# Patient Record
Sex: Female | Born: 1975
Health system: Southern US, Community
[De-identification: ages and names within clinical notes are randomized; demographics above are authoritative.]

## PROBLEM LIST (undated history)

## (undated) ENCOUNTER — Inpatient Hospital Stay (HOSPITAL_COMMUNITY): Payer: Self-pay

## (undated) DIAGNOSIS — B009 Herpesviral infection, unspecified: Secondary | ICD-10-CM

## (undated) DIAGNOSIS — D649 Anemia, unspecified: Secondary | ICD-10-CM

## (undated) DIAGNOSIS — Z8619 Personal history of other infectious and parasitic diseases: Secondary | ICD-10-CM

## (undated) DIAGNOSIS — O24419 Gestational diabetes mellitus in pregnancy, unspecified control: Secondary | ICD-10-CM

## (undated) DIAGNOSIS — E669 Obesity, unspecified: Secondary | ICD-10-CM

## (undated) DIAGNOSIS — T7840XA Allergy, unspecified, initial encounter: Secondary | ICD-10-CM

## (undated) DIAGNOSIS — E119 Type 2 diabetes mellitus without complications: Secondary | ICD-10-CM

## (undated) DIAGNOSIS — E785 Hyperlipidemia, unspecified: Secondary | ICD-10-CM

## (undated) DIAGNOSIS — O09529 Supervision of elderly multigravida, unspecified trimester: Secondary | ICD-10-CM

## (undated) HISTORY — DX: Anemia, unspecified: D64.9

## (undated) HISTORY — DX: Personal history of other infectious and parasitic diseases: Z86.19

## (undated) HISTORY — DX: Gestational diabetes mellitus in pregnancy, unspecified control: O24.419

## (undated) HISTORY — PX: HERPES SIMPLEX VIRUS DFA: LAB15028

## (undated) HISTORY — DX: Herpesviral infection, unspecified: B00.9

## (undated) HISTORY — DX: Allergy, unspecified, initial encounter: T78.40XA

## (undated) HISTORY — DX: Hyperlipidemia, unspecified: E78.5

## (undated) HISTORY — DX: Supervision of elderly multigravida, unspecified trimester: O09.529

## (undated) HISTORY — DX: Obesity, unspecified: E66.9

---

## 2006-08-14 DIAGNOSIS — Z8619 Personal history of other infectious and parasitic diseases: Secondary | ICD-10-CM

## 2006-08-14 HISTORY — DX: Personal history of other infectious and parasitic diseases: Z86.19

## 2010-10-18 ENCOUNTER — Inpatient Hospital Stay (HOSPITAL_COMMUNITY): Payer: Self-pay

## 2010-10-18 ENCOUNTER — Encounter (HOSPITAL_COMMUNITY): Payer: Self-pay

## 2010-10-18 ENCOUNTER — Inpatient Hospital Stay (HOSPITAL_COMMUNITY)
Admission: AD | Admit: 2010-10-18 | Discharge: 2010-10-18 | Disposition: A | Discharge status: Home / Self Care | Payer: Self-pay | Source: Ambulatory Visit | Attending: Obstetrics & Gynecology | Admitting: Obstetrics & Gynecology

## 2010-10-18 DIAGNOSIS — R109 Unspecified abdominal pain: Secondary | ICD-10-CM

## 2010-10-18 DIAGNOSIS — O9989 Other specified diseases and conditions complicating pregnancy, childbirth and the puerperium: Secondary | ICD-10-CM

## 2010-10-18 DIAGNOSIS — O99891 Other specified diseases and conditions complicating pregnancy: Secondary | ICD-10-CM | POA: Insufficient documentation

## 2010-10-18 LAB — POCT PREGNANCY, URINE: Preg Test, Ur: POSITIVE

## 2010-10-18 LAB — URINALYSIS, ROUTINE W REFLEX MICROSCOPIC
Bilirubin Urine: NEGATIVE
Hgb urine dipstick: NEGATIVE
Nitrite: NEGATIVE
Protein, ur: NEGATIVE mg/dL
Urobilinogen, UA: 0.2 mg/dL (ref 0.0–1.0)

## 2010-10-18 LAB — DIFFERENTIAL
Basophils Absolute: 0 10*3/uL (ref 0.0–0.1)
Basophils Relative: 0 % (ref 0–1)
Eosinophils Absolute: 0.1 10*3/uL (ref 0.0–0.7)
Monocytes Absolute: 0.7 10*3/uL (ref 0.1–1.0)
Monocytes Relative: 7 % (ref 3–12)
Neutrophils Relative %: 72 % (ref 43–77)

## 2010-10-18 LAB — CBC
MCH: 22.8 pg — ABNORMAL LOW (ref 26.0–34.0)
MCHC: 31.5 g/dL (ref 30.0–36.0)
Platelets: 247 10*3/uL (ref 150–400)

## 2010-10-18 LAB — WET PREP, GENITAL: Yeast Wet Prep HPF POC: NONE SEEN

## 2010-11-13 ENCOUNTER — Inpatient Hospital Stay (HOSPITAL_COMMUNITY): Admission: AD | Admit: 2010-11-13 | Payer: Self-pay | Source: Ambulatory Visit | Admitting: Obstetrics & Gynecology

## 2010-11-28 ENCOUNTER — Other Ambulatory Visit: Payer: Self-pay | Admitting: Family Medicine

## 2010-11-28 DIAGNOSIS — Z3689 Encounter for other specified antenatal screening: Secondary | ICD-10-CM

## 2010-11-28 LAB — ANTIBODY SCREEN: Antibody Screen: NEGATIVE

## 2010-11-28 LAB — HIV ANTIBODY (ROUTINE TESTING W REFLEX): HIV: NONREACTIVE

## 2010-11-28 LAB — ABO/RH: RH Type: POSITIVE

## 2010-12-09 ENCOUNTER — Ambulatory Visit (HOSPITAL_COMMUNITY)
Admission: RE | Admit: 2010-12-09 | Discharge: 2010-12-09 | Disposition: A | Discharge status: Home / Self Care | Payer: Medicaid Other | Source: Ambulatory Visit | Attending: Family Medicine | Admitting: Family Medicine

## 2010-12-09 DIAGNOSIS — Z3689 Encounter for other specified antenatal screening: Secondary | ICD-10-CM

## 2010-12-09 DIAGNOSIS — Z1389 Encounter for screening for other disorder: Secondary | ICD-10-CM | POA: Insufficient documentation

## 2010-12-09 DIAGNOSIS — Z363 Encounter for antenatal screening for malformations: Secondary | ICD-10-CM | POA: Insufficient documentation

## 2010-12-09 DIAGNOSIS — O358XX0 Maternal care for other (suspected) fetal abnormality and damage, not applicable or unspecified: Secondary | ICD-10-CM | POA: Insufficient documentation

## 2010-12-09 DIAGNOSIS — O09529 Supervision of elderly multigravida, unspecified trimester: Secondary | ICD-10-CM | POA: Insufficient documentation

## 2011-01-23 ENCOUNTER — Other Ambulatory Visit: Payer: Self-pay | Admitting: Family Medicine

## 2011-01-23 DIAGNOSIS — O3660X0 Maternal care for excessive fetal growth, unspecified trimester, not applicable or unspecified: Secondary | ICD-10-CM

## 2011-02-13 ENCOUNTER — Ambulatory Visit (HOSPITAL_COMMUNITY)
Admission: RE | Admit: 2011-02-13 | Discharge: 2011-02-13 | Disposition: A | Discharge status: Home / Self Care | Payer: Self-pay | Source: Ambulatory Visit | Attending: Family Medicine | Admitting: Family Medicine

## 2011-02-13 DIAGNOSIS — O3660X0 Maternal care for excessive fetal growth, unspecified trimester, not applicable or unspecified: Secondary | ICD-10-CM

## 2011-02-13 DIAGNOSIS — Z1389 Encounter for screening for other disorder: Secondary | ICD-10-CM | POA: Insufficient documentation

## 2011-02-13 DIAGNOSIS — O358XX0 Maternal care for other (suspected) fetal abnormality and damage, not applicable or unspecified: Secondary | ICD-10-CM | POA: Insufficient documentation

## 2011-02-13 DIAGNOSIS — O09529 Supervision of elderly multigravida, unspecified trimester: Secondary | ICD-10-CM | POA: Insufficient documentation

## 2011-02-13 DIAGNOSIS — Z363 Encounter for antenatal screening for malformations: Secondary | ICD-10-CM | POA: Insufficient documentation

## 2011-04-03 ENCOUNTER — Other Ambulatory Visit: Payer: Self-pay | Admitting: Family Medicine

## 2011-04-03 DIAGNOSIS — Z1389 Encounter for screening for other disorder: Secondary | ICD-10-CM

## 2011-04-05 ENCOUNTER — Ambulatory Visit (HOSPITAL_COMMUNITY)
Admission: RE | Admit: 2011-04-05 | Discharge: 2011-04-05 | Disposition: A | Discharge status: Home / Self Care | Payer: Self-pay | Source: Ambulatory Visit | Attending: Family Medicine | Admitting: Family Medicine

## 2011-04-05 DIAGNOSIS — O3660X0 Maternal care for excessive fetal growth, unspecified trimester, not applicable or unspecified: Secondary | ICD-10-CM | POA: Insufficient documentation

## 2011-04-05 DIAGNOSIS — Z1389 Encounter for screening for other disorder: Secondary | ICD-10-CM

## 2011-04-05 DIAGNOSIS — O09529 Supervision of elderly multigravida, unspecified trimester: Secondary | ICD-10-CM | POA: Insufficient documentation

## 2011-04-19 LAB — STREP B DNA PROBE: GBS: NEGATIVE

## 2011-04-21 ENCOUNTER — Inpatient Hospital Stay (HOSPITAL_COMMUNITY)
Admission: AD | Admit: 2011-04-21 | Discharge: 2011-04-22 | Disposition: A | Discharge status: Home / Self Care | Payer: Self-pay | Source: Ambulatory Visit | Attending: Family Medicine | Admitting: Family Medicine

## 2011-04-21 DIAGNOSIS — O368190 Decreased fetal movements, unspecified trimester, not applicable or unspecified: Secondary | ICD-10-CM | POA: Insufficient documentation

## 2011-04-22 ENCOUNTER — Inpatient Hospital Stay (HOSPITAL_COMMUNITY): Payer: Self-pay

## 2011-04-22 ENCOUNTER — Encounter (HOSPITAL_COMMUNITY): Payer: Self-pay | Admitting: Obstetrics and Gynecology

## 2011-04-22 LAB — URINALYSIS, ROUTINE W REFLEX MICROSCOPIC
Glucose, UA: NEGATIVE mg/dL
Hgb urine dipstick: NEGATIVE
Specific Gravity, Urine: 1.02 (ref 1.005–1.030)
pH: 5.5 (ref 5.0–8.0)

## 2011-04-22 NOTE — Progress Notes (Signed)
"  No fetal movement since 1700.  Only one movement felt while in the little room just before coming in this room.  Lots of pressure in lower stomach."

## 2011-04-22 NOTE — Progress Notes (Signed)
Pt states, " I haven't felt movement since 5 pm, but in the lobby I felt one kick. I am also feeling a lot of pressure in my low abdomen."

## 2011-04-22 NOTE — ED Provider Notes (Signed)
History   Patient is a 35-yo G3P1011 at 37.[redacted] wks EGA who presented to [facility]U for complaint of decreased fetal movement and pressure above her bladder that gives her the urge to urinate but she does not have to go. No LOF or vaginal bleeding, no contractions. Gets her care at HD.  Chief Complaint  Patient presents with  . Decreased Fetal Movement   HPI   History reviewed. No pertinent past medical history.  History reviewed. No pertinent past surgical history.  No family history on file.  History  Substance Use Topics  . Smoking status: Never Smoker   . Smokeless tobacco: Not on file  . Alcohol Use: No    Allergies: No Known Allergies  Prescriptions prior to admission  Medication Sig Dispense Refill  . docusate sodium (COLACE) 100 MG capsule Take 100 mg by mouth 2 (two) times daily.        . prenatal vitamin w/FE, FA (PRENATAL 1 + 1) 27-1 MG TABS Take 1 tablet by mouth daily.          Review of Systems  Constitutional: Negative for fever and chills.  Eyes: Negative for blurred vision.  Respiratory: Negative for cough.   Cardiovascular: Negative for chest pain.  Gastrointestinal: Negative for heartburn, nausea, vomiting and abdominal pain.  Genitourinary: Positive for urgency. Negative for dysuria, frequency, hematuria and flank pain.  Musculoskeletal: Negative for myalgias.  Skin: Negative for rash.  Neurological: Negative for headaches.   Physical Exam   Blood pressure 121/82, pulse 96, temperature 99.2 F (37.3 C), temperature source Oral, resp. rate 20, height 5' 4.5" (1.638 m), weight 104.554 kg (230 lb 8 oz), last menstrual period 08/03/2010.  Physical Exam  Constitutional: She is oriented to person, place, and time. She appears well-developed and well-nourished. No distress.  HENT:  Head: Normocephalic.  Neck: Normal range of motion.  Cardiovascular: Normal rate, regular rhythm, normal heart sounds and intact distal pulses.  Exam reveals no gallop and no friction  rub.   No murmur heard. Respiratory: Effort normal and breath sounds normal. No respiratory distress. She has no wheezes. She has no rales. She exhibits no tenderness.  GI: Soft. Bowel sounds are normal. She exhibits no distension and no mass. There is no tenderness. There is no rebound and no guarding.  Genitourinary: Vagina normal. No vaginal discharge found.  Musculoskeletal: Normal range of motion.  Neurological: She is alert and oriented to person, place, and time. She has normal reflexes. She displays normal reflexes. Coordination normal.  Skin: Skin is warm and dry. She is not diaphoretic. No erythema.  Psychiatric: She has a normal mood and affect.   Dilation: Closed Effacement (%): Thick Cervical Position: Posterior Station: -3 Presentation: Vertex Exam by:: Dr. Vira Blanco vrMAU Course  Procedures NST reactive BPP 8/8 Results for orders placed during the hospital encounter of 04/21/11 (from the past 24 hour(s))  URINALYSIS, ROUTINE W REFLEX MICROSCOPIC     Status: Normal   Collection Time   04/22/11 12:25 AM      Component Value Range   Color, Urine YELLOW  YELLOW    Appearance CLEAR  CLEAR    Specific Gravity, Urine 1.020  1.005 - 1.030    pH 5.5  5.0 - 8.0    Glucose, UA NEGATIVE  NEGATIVE (mg/dL)   Hgb urine dipstick NEGATIVE  NEGATIVE    Bilirubin Urine NEGATIVE  NEGATIVE    Ketones, ur NEGATIVE  NEGATIVE (mg/dL)   Protein, ur NEGATIVE  NEGATIVE (mg/dL)  Urobilinogen, UA 0.2  0.0 - 1.0 (mg/dL)   Nitrite NEGATIVE  NEGATIVE    Leukocytes, UA NEGATIVE  NEGATIVE     Assessment and Plan  Normal BPP and NST, no changes in cervix. No findings in urine to suggest UTI. Pt has appt at HD on Tuesday. Pregnancy precautions provided.  Shakur Lembo N 04/22/2011, 3:34 AM

## 2011-05-12 ENCOUNTER — Ambulatory Visit (INDEPENDENT_AMBULATORY_CARE_PROVIDER_SITE_OTHER): Payer: Self-pay | Admitting: *Deleted

## 2011-05-12 DIAGNOSIS — O480 Post-term pregnancy: Secondary | ICD-10-CM

## 2011-05-12 NOTE — Progress Notes (Signed)
NST performed on 05/12/2011 was reviewed and was found to be reactive.   AFI normal at 21.3.  Continue recommended antenatal testing and prenatal care.

## 2011-05-15 ENCOUNTER — Encounter (HOSPITAL_COMMUNITY): Payer: Self-pay | Admitting: *Deleted

## 2011-05-15 ENCOUNTER — Telehealth (HOSPITAL_COMMUNITY): Payer: Self-pay | Admitting: *Deleted

## 2011-05-15 NOTE — Telephone Encounter (Signed)
Preadmission screen  

## 2011-05-16 ENCOUNTER — Encounter (HOSPITAL_COMMUNITY): Payer: Self-pay | Admitting: *Deleted

## 2011-05-19 ENCOUNTER — Inpatient Hospital Stay (HOSPITAL_COMMUNITY)
Admission: RE | Admit: 2011-05-19 | Discharge: 2011-05-24 | DRG: 765 | Disposition: A | Discharge status: Home / Self Care | Payer: Medicaid Other | Source: Ambulatory Visit | Attending: Obstetrics and Gynecology | Admitting: Obstetrics and Gynecology

## 2011-05-19 ENCOUNTER — Encounter (HOSPITAL_COMMUNITY): Payer: Self-pay

## 2011-05-19 ENCOUNTER — Ambulatory Visit (INDEPENDENT_AMBULATORY_CARE_PROVIDER_SITE_OTHER): Payer: Self-pay | Admitting: *Deleted

## 2011-05-19 VITALS — BP 121/78

## 2011-05-19 DIAGNOSIS — O411090 Infection of amniotic sac and membranes, unspecified, unspecified trimester, not applicable or unspecified: Secondary | ICD-10-CM | POA: Diagnosis present

## 2011-05-19 DIAGNOSIS — O09529 Supervision of elderly multigravida, unspecified trimester: Secondary | ICD-10-CM | POA: Diagnosis present

## 2011-05-19 DIAGNOSIS — O480 Post-term pregnancy: Secondary | ICD-10-CM

## 2011-05-19 DIAGNOSIS — O339 Maternal care for disproportion, unspecified: Secondary | ICD-10-CM | POA: Diagnosis present

## 2011-05-19 DIAGNOSIS — O330 Maternal care for disproportion due to deformity of maternal pelvic bones: Secondary | ICD-10-CM | POA: Diagnosis present

## 2011-05-19 DIAGNOSIS — R42 Dizziness and giddiness: Secondary | ICD-10-CM

## 2011-05-19 LAB — CBC
Hemoglobin: 11.7 g/dL — ABNORMAL LOW (ref 12.0–15.0)
MCH: 23.4 pg — ABNORMAL LOW (ref 26.0–34.0)
MCV: 70.7 fL — ABNORMAL LOW (ref 78.0–100.0)
Platelets: 207 10*3/uL (ref 150–400)
RBC: 4.99 MIL/uL (ref 3.87–5.11)
WBC: 9.1 10*3/uL (ref 4.0–10.5)

## 2011-05-19 MED ORDER — OXYTOCIN 20 UNITS IN LACTATED RINGERS INFUSION - SIMPLE
1.0000 m[IU]/min | INTRAVENOUS | Status: DC
  Administered 2011-05-19: 2 m[IU]/min via INTRAVENOUS
  Filled 2011-05-19: qty 1000

## 2011-05-19 MED ORDER — FLEET ENEMA 7-19 GM/118ML RE ENEM: 1.0000 | ENEMA | RECTAL | Status: DC | PRN

## 2011-05-19 MED ORDER — IBUPROFEN 600 MG PO TABS: 600.0000 mg | ORAL_TABLET | Freq: Four times a day (QID) | ORAL | Status: DC | PRN

## 2011-05-19 MED ORDER — ONDANSETRON HCL 4 MG/2ML IJ SOLN: 4.0000 mg | Freq: Four times a day (QID) | INTRAMUSCULAR | Status: DC | PRN

## 2011-05-19 MED ORDER — OXYCODONE-ACETAMINOPHEN 5-325 MG PO TABS: 2.0000 | ORAL_TABLET | ORAL | Status: DC | PRN

## 2011-05-19 MED ORDER — TERBUTALINE SULFATE 1 MG/ML IJ SOLN: 0.2500 mg | Freq: Once | INTRAMUSCULAR | Status: AC | PRN

## 2011-05-19 MED ORDER — LACTATED RINGERS IV SOLN
500.0000 mL | INTRAVENOUS | Status: DC | PRN
  Administered 2011-05-21 (×2): 1000 mL via INTRAVENOUS

## 2011-05-19 MED ORDER — LACTATED RINGERS IV SOLN
INTRAVENOUS | Status: DC
  Administered 2011-05-19: 21:00:00 via INTRAVENOUS
  Administered 2011-05-20 (×2): 125 mL/h via INTRAVENOUS
  Administered 2011-05-21 (×2): via INTRAVENOUS
  Administered 2011-05-21: 125 mL/h via INTRAVENOUS
  Administered 2011-05-21 (×2): via INTRAVENOUS

## 2011-05-19 MED ORDER — OXYTOCIN 20 UNITS IN LACTATED RINGERS INFUSION - SIMPLE: 125.0000 mL/h | Freq: Once | INTRAVENOUS | Status: DC

## 2011-05-19 MED ORDER — OXYTOCIN BOLUS FROM INFUSION
500.0000 mL | Freq: Once | INTRAVENOUS | Status: DC
  Filled 2011-05-19: qty 500

## 2011-05-19 MED ORDER — CITRIC ACID-SODIUM CITRATE 334-500 MG/5ML PO SOLN
30.0000 mL | ORAL | Status: DC | PRN
  Administered 2011-05-21: 30 mL via ORAL
  Filled 2011-05-19: qty 15

## 2011-05-19 MED ORDER — ACETAMINOPHEN 325 MG PO TABS: 650.0000 mg | ORAL_TABLET | ORAL | Status: DC | PRN

## 2011-05-19 MED ORDER — LIDOCAINE HCL (PF) 1 % IJ SOLN
30.0000 mL | INTRAMUSCULAR | Status: DC | PRN
  Filled 2011-05-19 (×2): qty 30

## 2011-05-19 NOTE — Progress Notes (Signed)
P - 102  NST only today   IOL tonight @ 1930     Report faxed yo [facility]

## 2011-05-19 NOTE — Progress Notes (Signed)
Constantly adjusting [place].  Not tracing well, trying to change positions to find baby more easily.

## 2011-05-19 NOTE — H&P (Signed)
Morgan Ramsey is a 35 y.o. female presenting for induction of labor for postdates pregnancy. Prenatal course has been followed at the [facility] and was essentially uncomplicated, with occasional mild blood pressure elevation into the 130 to 140 systolic with no evidence of preeclampsia. The patient has noticed good fetal movement and denies contractions at this time and denies rupture of membranes. She has been experiencing suprapubic pressure. Induction is explained through the translator. Plan is to begin Pitocin. Maternal Medical History:  Fetal activity: Perceived fetal activity is normal.    Prenatal complications: No bleeding, hypertension, pre-eclampsia, preterm labor or substance abuse.   Prenatal Complications - Diabetes: none.    OB History    Grav Para Term Preterm Abortions TAB SAB Ect Mult Living   3 1 1  1 1    1      Past Medical History  Diagnosis Date  . History of chlamydia 2008  . AMA (advanced maternal age) multigravida 35+   . Obese   . History of candidal vulvovaginitis    History reviewed. No pertinent past surgical history. Family History: family history includes Diabetes in her mother and Hypertension in her mother.  There is no history of Anesthesia problems, and Hypotension, and Malignant hyperthermia, and Pseudochol deficiency, . Social History:  reports that she has never smoked. She does not have any smokeless tobacco history on file. She reports that she does not drink alcohol or use illicit drugs.  ROS review of systems negative as listed above  Dilation: 1 Effacement (%): 20 Station: -3 Exam by:: Dr. Emelda Fear Blood pressure 138/76, pulse 90, resp. rate 18, height 5\' 6"  (1.676 m), weight 105.235 kg (232 lb), last menstrual period 08/03/2010. Exam Physical Exam Physical Examination: General appearance - alert, well appearing, and in no distress, oriented to person, place, and time, overweight and comfortable at this  time Mental status - alert, oriented to person, place, and time, communicates easily through translator in Spanish Abdomen - gravid uterus consistent with dates with vertex presentation by Thayer Ohm maneuvers Pelvic - VULVA: normal appearing vulva with no masses, tenderness or lesions,  VAGINA: Vaginal secretions normal,  CERVIX: Cervix is posterior soft 20% vertex presentation -2 station, exam chaperoned by labor and delivery nurse Extremities - peripheral pulses normal, mild pretibial edema normal reflexes  Prenatal labs: ABO, Rh: O/Positive/-- (04/16 0000) Antibody: Negative (04/16 0000) Rubella: Immune (04/16 0000) RPR: Nonreactive (04/16 0000)  HBsAg: Negative (04/16 0000)  HIV: Non-reactive (04/16 0000)  GBS: Negative (09/05 0000)   Assessment/Plan: Pregnancy 41 weeks 2 days postdates pregnancy admitted for induction of labor, group B  strep negative  Plan admit for Pitocin induction of labor her cervix is too far posterior to consider balloon at this time. Given the rapid nature first labor anticipate good response to induction prognosis for delivery vaginal   Glorya Bartley V 05/19/2011, 8:49 PM

## 2011-05-20 ENCOUNTER — Encounter (HOSPITAL_COMMUNITY): Payer: Self-pay

## 2011-05-20 LAB — RPR: RPR Ser Ql: NONREACTIVE

## 2011-05-20 MED ORDER — NALBUPHINE SYRINGE 5 MG/0.5 ML
10.0000 mg | INJECTION | INTRAMUSCULAR | Status: DC | PRN
  Administered 2011-05-20 – 2011-05-21 (×2): 10 mg via INTRAVENOUS
  Filled 2011-05-20 (×3): qty 0.5
  Filled 2011-05-20: qty 1
  Filled 2011-05-20: qty 0.5

## 2011-05-20 MED ORDER — MISOPROSTOL 25 MCG QUARTER TABLET
25.0000 ug | ORAL_TABLET | ORAL | Status: DC
  Administered 2011-05-20 (×2): 25 ug via ORAL
  Filled 2011-05-20: qty 1
  Filled 2011-05-20: qty 0.25
  Filled 2011-05-20 (×2): qty 1
  Filled 2011-05-20: qty 0.25
  Filled 2011-05-20 (×2): qty 1

## 2011-05-20 MED ORDER — OXYTOCIN 20 UNITS IN LACTATED RINGERS INFUSION - SIMPLE
1.0000 m[IU]/min | INTRAVENOUS | Status: DC
  Administered 2011-05-20: 8 m[IU]/min via INTRAVENOUS
  Administered 2011-05-20: 4 m[IU]/min via INTRAVENOUS
  Administered 2011-05-20: 6 m[IU]/min via INTRAVENOUS
  Administered 2011-05-21: 20 m[IU]/min via INTRAVENOUS
  Filled 2011-05-20: qty 1000

## 2011-05-20 MED ORDER — TERBUTALINE SULFATE 1 MG/ML IJ SOLN: 0.2500 mg | Freq: Once | INTRAMUSCULAR | Status: AC | PRN

## 2011-05-20 MED ORDER — NALBUPHINE HCL 10 MG/ML IJ SOLN
10.0000 mg | INTRAMUSCULAR | Status: DC | PRN
  Administered 2011-05-20: 10 mg via INTRAVENOUS
  Filled 2011-05-20: qty 1

## 2011-05-20 NOTE — Progress Notes (Signed)
Morgan Ramsey is a 35 y.o. G3P1011 at [redacted]w[redacted]d }  Subjective: Comfortable at present; foley bulb still in  Objective: BP 135/71  Pulse 74  Temp(Src) 98.3 F (36.8 C) (Oral)  Resp 18  Ht 5\' 6"  (1.676 m)  Wt 105.235 kg (232 lb)  BMI 37.45 kg/m2  LMP 08/03/2010      FHT:  FHR: 135 bpm, variability: moderate,  accelerations:  Present,  decelerations:  Absent UC:   irregular, every 3-6 minutes with Pit at 59mu/min SVE:   Dilation: 2 Effacement (%): 60 Station: -2 Exam by:: S Grindstaff RN  Labs: Lab Results  Component Value Date   WBC 9.1 05/19/2011   HGB 11.7* 05/19/2011   HCT 35.3* 05/19/2011   MCV 70.7* 05/19/2011   PLT 207 05/19/2011    Assessment / Plan: IOL for postdates Ripening phase  Will allow to eat and then will begin to increase Pitocin Report to Roney Marion CNM   SHAW, KIMBERLY 05/20/2011, 11:46 PM

## 2011-05-20 NOTE — Progress Notes (Signed)
Pt up in bathroom  

## 2011-05-20 NOTE — Progress Notes (Signed)
Morgan Ramsey is a 35 y.o. G3P1011 at [redacted]w[redacted]d by ultrasound admitted for induction of labor due to Post dates. Due date 05/10/11.  Subjective: Patient reports feeling minimal discomfort.  Objective: BP 121/61  Pulse 84  Temp(Src) 98.1 F (36.7 C) (Oral)  Resp 20  Ht 5\' 6"  (1.676 m)  Wt 105.235 kg (232 lb)  BMI 37.45 kg/m2  LMP 08/03/2010      FHT:  FHR: 135 bpm, variability: moderate,  accelerations:  Present,  decelerations:  Absent UC:   irregular, every 5 or so minutes SVE:   Dilation: 1 Effacement (%): Thick Station: -3 Exam by:: Dr. Jerrol Banana  Labs: Lab Results  Component Value Date   WBC 9.1 05/19/2011   HGB 11.7* 05/19/2011   HCT 35.3* 05/19/2011   MCV 70.7* 05/19/2011   PLT 207 05/19/2011    Assessment / Plan: No change on pitocin x 4 hours, will change to cytotec. Fetal Wellbeing:  Category I Pain Control:  Labor support without medications I/D:  n/a Anticipated MOD:  NSVD  Morgan Ramsey N 05/20/2011, 12:29 AM

## 2011-05-20 NOTE — Progress Notes (Signed)
Morgan Ramsey is a 35 y.o. G3P1011 at [redacted]w[redacted]d by LMP admitted for induction of labor due to Post dates. Due date 09/26.  Subjective: Pt reports contractions are becoming more uncomfortable in response to 1st cytotec dose. No bleeding or SROM.  Objective: BP 103/63  Pulse 93  Temp(Src) 98.3 F (36.8 C) (Oral)  Resp 18  Ht 5\' 6"  (1.676 m)  Wt 105.235 kg (232 lb)  BMI 37.45 kg/m2  LMP 08/03/2010      FHT:  FHR: 140 bpm, variability: moderate,  accelerations:  Present,  decelerations:  Absent definite change from my initial exam at admission UC:   irregular, every 2-6 minutes,   SVE:   Dilation: 1 Effacement (%): 20 Station: -2 Exam by:: [email or web address]  Labs: Lab Results  Component Value Date   WBC 9.1 05/19/2011   HGB 11.7* 05/19/2011   HCT 35.3* 05/19/2011   MCV 70.7* 05/19/2011   PLT 207 05/19/2011    Assessment / Plan: IOL for postdates, slowly responding to cytotec  Labor: aadequate response so far Preeclampsia:   Fetal Wellbeing:  Category I Pain Control:  Labor support without medications I/D:  n/a Anticipated MOD:  NSVD  Morgan Ramsey V 05/20/2011, 5:08 AM

## 2011-05-20 NOTE — Progress Notes (Signed)
Morgan Ramsey is a 35 y.o. G3P1011 at [redacted]w[redacted]d by admitted for induction of labor due to SROM without contractions.  Subjective: Feeling contractions.  Objective: BP 121/68  Pulse 80  Temp(Src) 97.6 F (36.4 C) (Oral)  Resp 18  Ht 5\' 6"  (1.676 m)  Wt 105.235 kg (232 lb)  BMI 37.45 kg/m2  LMP 08/03/2010      FHT:  FHR: 160s bpm, variability: moderate,  accelerations:  Present,  decelerations:  Absent UC:   regular, every 2-3 minutes SVE:   Dilation: 2 Effacement (%): 60 Station: -3 Exam by:: [employer] RN  Labs: Lab Results  Component Value Date   WBC 9.1 05/19/2011   HGB 11.7* 05/19/2011   HCT 35.3* 05/19/2011   MCV 70.7* 05/19/2011   PLT 207 05/19/2011    Assessment / Plan: IOL for SROM - foley bulb placed.  Continue with pitocin at 6 milliunits.  Fetal Wellbeing:  Category I Pain Control:  Labor support without medications I/D:  n/a Anticipated MOD:  NSVD  Morgan Ramsey 05/20/2011, 2:10 PM

## 2011-05-21 ENCOUNTER — Other Ambulatory Visit: Payer: Self-pay | Admitting: Obstetrics and Gynecology

## 2011-05-21 ENCOUNTER — Encounter (HOSPITAL_COMMUNITY): Payer: Self-pay

## 2011-05-21 ENCOUNTER — Encounter (HOSPITAL_COMMUNITY): Payer: Self-pay | Admitting: Anesthesiology

## 2011-05-21 ENCOUNTER — Inpatient Hospital Stay (HOSPITAL_COMMUNITY): Payer: Medicaid Other | Admitting: Anesthesiology

## 2011-05-21 ENCOUNTER — Encounter (HOSPITAL_COMMUNITY)
Admission: RE | Disposition: A | Discharge status: Home / Self Care | Payer: Self-pay | Source: Ambulatory Visit | Attending: Obstetrics and Gynecology

## 2011-05-21 DIAGNOSIS — O339 Maternal care for disproportion, unspecified: Secondary | ICD-10-CM | POA: Diagnosis present

## 2011-05-21 LAB — CBC
Hemoglobin: 9.9 g/dL — ABNORMAL LOW (ref 12.0–15.0)
MCH: 23.5 pg — ABNORMAL LOW (ref 26.0–34.0)
MCV: 72.4 fL — ABNORMAL LOW (ref 78.0–100.0)
RBC: 4.21 MIL/uL (ref 3.87–5.11)

## 2011-05-21 SURGERY — Surgical Case
Anesthesia: Regional | Site: Abdomen | Wound class: Clean Contaminated

## 2011-05-21 MED ORDER — KETOROLAC TROMETHAMINE 30 MG/ML IJ SOLN: 30.0000 mg | Freq: Four times a day (QID) | INTRAMUSCULAR | Status: AC | PRN

## 2011-05-21 MED ORDER — MORPHINE SULFATE (PF) 0.5 MG/ML IJ SOLN
INTRAMUSCULAR | Status: DC | PRN
  Administered 2011-05-21: 1 mg via INTRAVENOUS

## 2011-05-21 MED ORDER — IBUPROFEN 600 MG PO TABS
600.0000 mg | ORAL_TABLET | Freq: Four times a day (QID) | ORAL | Status: DC | PRN
  Filled 2011-05-21 (×6): qty 1

## 2011-05-21 MED ORDER — KETOROLAC TROMETHAMINE 60 MG/2ML IM SOLN
60.0000 mg | Freq: Once | INTRAMUSCULAR | Status: AC | PRN
  Administered 2011-05-21: 60 mg via INTRAMUSCULAR

## 2011-05-21 MED ORDER — SENNOSIDES-DOCUSATE SODIUM 8.6-50 MG PO TABS
2.0000 | ORAL_TABLET | Freq: Every day | ORAL | Status: DC
  Administered 2011-05-21 – 2011-05-23 (×3): 2 via ORAL

## 2011-05-21 MED ORDER — SIMETHICONE 80 MG PO CHEW: 80.0000 mg | CHEWABLE_TABLET | ORAL | Status: DC | PRN

## 2011-05-21 MED ORDER — DIPHENHYDRAMINE HCL 25 MG PO CAPS
25.0000 mg | ORAL_CAPSULE | ORAL | Status: DC | PRN
  Administered 2011-05-21 – 2011-05-23 (×2): 25 mg via ORAL
  Filled 2011-05-21 (×2): qty 1

## 2011-05-21 MED ORDER — FENTANYL 2.5 MCG/ML BUPIVACAINE 1/10 % EPIDURAL INFUSION (WH - ANES)
INTRAMUSCULAR | Status: DC | PRN
  Administered 2011-05-21: 07:00:00
  Administered 2011-05-21: 14 mL/h via EPIDURAL

## 2011-05-21 MED ORDER — PHENYLEPHRINE 40 MCG/ML (10ML) SYRINGE FOR IV PUSH (FOR BLOOD PRESSURE SUPPORT)
80.0000 ug | PREFILLED_SYRINGE | INTRAVENOUS | Status: DC | PRN
  Filled 2011-05-21: qty 5

## 2011-05-21 MED ORDER — DIPHENHYDRAMINE HCL 25 MG PO CAPS: 25.0000 mg | ORAL_CAPSULE | Freq: Four times a day (QID) | ORAL | Status: DC | PRN

## 2011-05-21 MED ORDER — KETOROLAC TROMETHAMINE 60 MG/2ML IM SOLN
INTRAMUSCULAR | Status: AC
  Administered 2011-05-21: 60 mg via INTRAMUSCULAR
  Filled 2011-05-21: qty 2

## 2011-05-21 MED ORDER — MORPHINE SULFATE (PF) 0.5 MG/ML IJ SOLN
INTRAMUSCULAR | Status: DC | PRN
  Administered 2011-05-21: 1 mg via EPIDURAL
  Administered 2011-05-21: 2 mg via EPIDURAL
  Administered 2011-05-21: 1 mg via EPIDURAL

## 2011-05-21 MED ORDER — OXYTOCIN 20 UNITS IN LACTATED RINGERS INFUSION - SIMPLE
125.0000 mL/h | INTRAVENOUS | Status: AC
  Administered 2011-05-21: 125 mL/h via INTRAVENOUS

## 2011-05-21 MED ORDER — NALBUPHINE SYRINGE 5 MG/0.5 ML
5.0000 mg | INJECTION | INTRAMUSCULAR | Status: DC | PRN
  Filled 2011-05-21: qty 1

## 2011-05-21 MED ORDER — MEPERIDINE HCL 25 MG/ML IJ SOLN: 6.2500 mg | INTRAMUSCULAR | Status: DC | PRN

## 2011-05-21 MED ORDER — SCOPOLAMINE 1 MG/3DAYS TD PT72
MEDICATED_PATCH | TRANSDERMAL | Status: AC
  Administered 2011-05-21: 1.5 mg via TRANSDERMAL
  Filled 2011-05-21: qty 1

## 2011-05-21 MED ORDER — CHLOROPROCAINE HCL 3 % IJ SOLN
INTRAMUSCULAR | Status: AC
  Filled 2011-05-21: qty 20

## 2011-05-21 MED ORDER — LIDOCAINE-EPINEPHRINE (PF) 2 %-1:200000 IJ SOLN
INTRAMUSCULAR | Status: AC
  Filled 2011-05-21: qty 20

## 2011-05-21 MED ORDER — TETANUS-DIPHTH-ACELL PERTUSSIS 5-2.5-18.5 LF-MCG/0.5 IM SUSP
0.5000 mL | Freq: Once | INTRAMUSCULAR | Status: AC
  Administered 2011-05-23: 0.5 mL via INTRAMUSCULAR
  Filled 2011-05-21: qty 0.5

## 2011-05-21 MED ORDER — OXYTOCIN 10 UNIT/ML IJ SOLN
INTRAMUSCULAR | Status: DC | PRN
  Administered 2011-05-21: 20 [IU] via INTRAMUSCULAR

## 2011-05-21 MED ORDER — ONDANSETRON HCL 4 MG/2ML IJ SOLN
INTRAMUSCULAR | Status: AC
  Filled 2011-05-21: qty 2

## 2011-05-21 MED ORDER — SODIUM CHLORIDE 0.9 % IV SOLN
1.0000 ug/kg/h | INTRAVENOUS | Status: DC | PRN
  Filled 2011-05-21: qty 2.5

## 2011-05-21 MED ORDER — NALOXONE HCL 0.4 MG/ML IJ SOLN: 0.4000 mg | INTRAMUSCULAR | Status: DC | PRN

## 2011-05-21 MED ORDER — SODIUM BICARBONATE 8.4 % IV SOLN
INTRAVENOUS | Status: AC
  Filled 2011-05-21: qty 50

## 2011-05-21 MED ORDER — DIPHENHYDRAMINE HCL 50 MG/ML IJ SOLN: 12.5000 mg | INTRAMUSCULAR | Status: DC | PRN

## 2011-05-21 MED ORDER — FENTANYL CITRATE 0.05 MG/ML IJ SOLN
INTRAMUSCULAR | Status: DC | PRN
  Administered 2011-05-21 (×6): 50 ug via INTRAVENOUS

## 2011-05-21 MED ORDER — SODIUM CHLORIDE 0.9 % IV SOLN
2.0000 g | Freq: Once | INTRAVENOUS | Status: AC
  Administered 2011-05-21: 2 g via INTRAVENOUS
  Filled 2011-05-21: qty 2000

## 2011-05-21 MED ORDER — LANOLIN HYDROUS EX OINT: 1.0000 "application " | TOPICAL_OINTMENT | CUTANEOUS | Status: DC | PRN

## 2011-05-21 MED ORDER — ONDANSETRON HCL 4 MG PO TABS: 4.0000 mg | ORAL_TABLET | ORAL | Status: DC | PRN

## 2011-05-21 MED ORDER — FENTANYL CITRATE 0.05 MG/ML IJ SOLN
INTRAMUSCULAR | Status: AC
  Filled 2011-05-21: qty 2

## 2011-05-21 MED ORDER — SIMETHICONE 80 MG PO CHEW
80.0000 mg | CHEWABLE_TABLET | Freq: Three times a day (TID) | ORAL | Status: DC
  Administered 2011-05-21 – 2011-05-24 (×12): 80 mg via ORAL

## 2011-05-21 MED ORDER — HYDROMORPHONE HCL 1 MG/ML IJ SOLN: 0.2500 mg | INTRAMUSCULAR | Status: DC | PRN

## 2011-05-21 MED ORDER — ONDANSETRON HCL 4 MG/2ML IJ SOLN
4.0000 mg | Freq: Three times a day (TID) | INTRAMUSCULAR | Status: DC | PRN
  Filled 2011-05-21: qty 2

## 2011-05-21 MED ORDER — PRENATAL PLUS 27-1 MG PO TABS
1.0000 | ORAL_TABLET | Freq: Every day | ORAL | Status: DC
  Administered 2011-05-22 – 2011-05-24 (×3): 1 via ORAL
  Filled 2011-05-21 (×3): qty 1

## 2011-05-21 MED ORDER — DIPHENHYDRAMINE HCL 50 MG/ML IJ SOLN: 25.0000 mg | INTRAMUSCULAR | Status: DC | PRN

## 2011-05-21 MED ORDER — BUPIVACAINE HCL (PF) 0.25 % IJ SOLN
INTRAMUSCULAR | Status: DC | PRN
  Administered 2011-05-21: 20 mL

## 2011-05-21 MED ORDER — EPHEDRINE 5 MG/ML INJ
INTRAVENOUS | Status: AC
  Filled 2011-05-21: qty 10

## 2011-05-21 MED ORDER — FENTANYL CITRATE 0.05 MG/ML IJ SOLN
INTRAMUSCULAR | Status: AC
Start: 2011-05-21 — End: 2011-05-21
  Filled 2011-05-21: qty 2

## 2011-05-21 MED ORDER — MENTHOL 3 MG MT LOZG: 1.0000 | LOZENGE | OROMUCOSAL | Status: DC | PRN

## 2011-05-21 MED ORDER — OXYCODONE-ACETAMINOPHEN 5-325 MG PO TABS
1.0000 | ORAL_TABLET | ORAL | Status: DC | PRN
  Administered 2011-05-22 – 2011-05-24 (×4): 1 via ORAL
  Administered 2011-05-24: 2 via ORAL
  Filled 2011-05-21 (×2): qty 1
  Filled 2011-05-21: qty 2
  Filled 2011-05-21 (×2): qty 1

## 2011-05-21 MED ORDER — SODIUM CHLORIDE 0.9 % IJ SOLN: 3.0000 mL | INTRAMUSCULAR | Status: DC | PRN

## 2011-05-21 MED ORDER — ZOLPIDEM TARTRATE 5 MG PO TABS: 5.0000 mg | ORAL_TABLET | Freq: Every evening | ORAL | Status: DC | PRN

## 2011-05-21 MED ORDER — FENTANYL 2.5 MCG/ML BUPIVACAINE 1/10 % EPIDURAL INFUSION (WH - ANES)
7.0000 mL/h | INTRAMUSCULAR | Status: DC
  Administered 2011-05-21: 14 mL/h via EPIDURAL
  Filled 2011-05-21 (×3): qty 60

## 2011-05-21 MED ORDER — SCOPOLAMINE 1 MG/3DAYS TD PT72
1.0000 | MEDICATED_PATCH | Freq: Once | TRANSDERMAL | Status: AC
  Administered 2011-05-21: 1.5 mg via TRANSDERMAL

## 2011-05-21 MED ORDER — WITCH HAZEL-GLYCERIN EX PADS: 1.0000 "application " | MEDICATED_PAD | CUTANEOUS | Status: DC | PRN

## 2011-05-21 MED ORDER — DIBUCAINE 1 % RE OINT: 1.0000 "application " | TOPICAL_OINTMENT | RECTAL | Status: DC | PRN

## 2011-05-21 MED ORDER — IBUPROFEN 600 MG PO TABS
600.0000 mg | ORAL_TABLET | Freq: Four times a day (QID) | ORAL | Status: DC
  Administered 2011-05-21 – 2011-05-24 (×11): 600 mg via ORAL
  Filled 2011-05-21 (×5): qty 1

## 2011-05-21 MED ORDER — GENTAMICIN SULFATE 40 MG/ML IJ SOLN
190.0000 mg | Freq: Three times a day (TID) | INTRAVENOUS | Status: DC
  Administered 2011-05-21: 190 mg via INTRAVENOUS
  Filled 2011-05-21 (×3): qty 4.75

## 2011-05-21 MED ORDER — OXYTOCIN 20 UNITS IN LACTATED RINGERS INFUSION - SIMPLE
INTRAVENOUS | Status: AC
  Filled 2011-05-21: qty 1000

## 2011-05-21 MED ORDER — LIDOCAINE HCL 1.5 % IJ SOLN
INTRAMUSCULAR | Status: DC | PRN
  Administered 2011-05-21 (×2): 5 mL via EPIDURAL

## 2011-05-21 MED ORDER — LACTATED RINGERS IV SOLN
INTRAVENOUS | Status: DC
  Administered 2011-05-21: 22:00:00 via INTRAVENOUS
  Administered 2011-05-22: 500 mL via INTRAVENOUS

## 2011-05-21 MED ORDER — LACTATED RINGERS IV SOLN
500.0000 mL | Freq: Once | INTRAVENOUS | Status: AC
  Administered 2011-05-21: 500 mL via INTRAVENOUS

## 2011-05-21 MED ORDER — METOCLOPRAMIDE HCL 5 MG/ML IJ SOLN: 10.0000 mg | Freq: Three times a day (TID) | INTRAMUSCULAR | Status: DC | PRN

## 2011-05-21 MED ORDER — ONDANSETRON HCL 4 MG/2ML IJ SOLN
INTRAMUSCULAR | Status: DC | PRN
  Administered 2011-05-21: 4 mg via INTRAVENOUS

## 2011-05-21 MED ORDER — EPHEDRINE 5 MG/ML INJ
10.0000 mg | INTRAVENOUS | Status: DC | PRN
  Filled 2011-05-21: qty 4

## 2011-05-21 MED ORDER — ONDANSETRON HCL 4 MG/2ML IJ SOLN
4.0000 mg | INTRAMUSCULAR | Status: DC | PRN
  Administered 2011-05-21: 4 mg via INTRAVENOUS

## 2011-05-21 MED ORDER — PHENYLEPHRINE 40 MCG/ML (10ML) SYRINGE FOR IV PUSH (FOR BLOOD PRESSURE SUPPORT)
80.0000 ug | PREFILLED_SYRINGE | INTRAVENOUS | Status: DC | PRN
  Administered 2011-05-21: 80 ug via INTRAVENOUS
  Filled 2011-05-21 (×2): qty 5

## 2011-05-21 MED ORDER — OXYTOCIN 10 UNIT/ML IJ SOLN
INTRAMUSCULAR | Status: AC
  Filled 2011-05-21: qty 2

## 2011-05-21 MED ORDER — EPHEDRINE SULFATE 50 MG/ML IJ SOLN
INTRAMUSCULAR | Status: DC | PRN
  Administered 2011-05-21: 10 mg via INTRAVENOUS

## 2011-05-21 MED ORDER — IBUPROFEN 600 MG PO TABS: 600.0000 mg | ORAL_TABLET | Freq: Four times a day (QID) | ORAL | Status: DC

## 2011-05-21 MED ORDER — EPHEDRINE 5 MG/ML INJ
10.0000 mg | INTRAVENOUS | Status: DC | PRN
  Filled 2011-05-21 (×2): qty 4

## 2011-05-21 MED ORDER — SODIUM CHLORIDE 0.9 % IR SOLN
Status: DC | PRN
  Administered 2011-05-21: 1000 mL

## 2011-05-21 MED ORDER — MORPHINE SULFATE 0.5 MG/ML IJ SOLN
INTRAMUSCULAR | Status: AC
  Filled 2011-05-21: qty 10

## 2011-05-21 MED ORDER — SODIUM BICARBONATE 8.4 % IV SOLN
INTRAVENOUS | Status: DC | PRN
  Administered 2011-05-21: 5 mL via EPIDURAL

## 2011-05-21 SURGICAL SUPPLY — 29 items
BENZOIN TINCTURE PRP APPL 2/3 (GAUZE/BANDAGES/DRESSINGS) IMPLANT
CLOTH BEACON ORANGE TIMEOUT ST (SAFETY) ×2 IMPLANT
DRSG COVADERM 4X10 (GAUZE/BANDAGES/DRESSINGS) ×2 IMPLANT
DRSG PAD ABDOMINAL 8X10 ST (GAUZE/BANDAGES/DRESSINGS) ×2 IMPLANT
ELECT REM PT RETURN 9FT ADLT (ELECTROSURGICAL) ×2
ELECTRODE REM PT RTRN 9FT ADLT (ELECTROSURGICAL) ×1 IMPLANT
EXTRACTOR VACUUM KIWI (MISCELLANEOUS) IMPLANT
GLOVE BIO SURGEON ST LM GN SZ9 (GLOVE) ×2 IMPLANT
GLOVE BIOGEL PI IND STRL 9 (GLOVE) ×2 IMPLANT
GLOVE BIOGEL PI INDICATOR 9 (GLOVE) ×2
GOWN PREVENTION PLUS LG XLONG (DISPOSABLE) ×2 IMPLANT
GOWN PREVENTION PLUS XLARGE (GOWN DISPOSABLE) ×2 IMPLANT
GOWN STRL REIN 3XL LVL4 (GOWN DISPOSABLE) ×2 IMPLANT
NEEDLE HYPO 25X5/8 SAFETYGLIDE (NEEDLE) IMPLANT
NS IRRIG 1000ML POUR BTL (IV SOLUTION) ×2 IMPLANT
PACK C SECTION WH (CUSTOM PROCEDURE TRAY) ×2 IMPLANT
RTRCTR C-SECT PINK 25CM LRG (MISCELLANEOUS) IMPLANT
SLEEVE SCD COMPRESS KNEE MED (MISCELLANEOUS) ×2 IMPLANT
STRIP CLOSURE SKIN 1/2X4 (GAUZE/BANDAGES/DRESSINGS) IMPLANT
SUT CHROMIC 0 CTX 36 (SUTURE) ×10 IMPLANT
SUT VIC AB 0 CT1 27 (SUTURE) ×1
SUT VIC AB 0 CT1 27XBRD ANBCTR (SUTURE) ×1 IMPLANT
SUT VIC AB 2-0 CT1 27 (SUTURE) ×2
SUT VIC AB 2-0 CT1 TAPERPNT 27 (SUTURE) ×2 IMPLANT
SUT VIC AB 4-0 KS 27 (SUTURE) ×2 IMPLANT
SYR BULB IRRIGATION 50ML (SYRINGE) IMPLANT
TAPE CLOTH SURG 4X10 WHT LF (GAUZE/BANDAGES/DRESSINGS) ×2 IMPLANT
TRAY FOLEY CATH 14FR (SET/KITS/TRAYS/PACK) IMPLANT
WATER STERILE IRR 1000ML POUR (IV SOLUTION) ×2 IMPLANT

## 2011-05-21 NOTE — Progress Notes (Signed)
Dr Shawnie Pons notified verbally of elevated temps

## 2011-05-21 NOTE — OR Nursing (Signed)
Uterus massaged by S. Visente Kirker Charity fundraiser. Two tubes of cord blood to lab. Foley catheter in place on arrival to OR. Urine color-blood tinged.

## 2011-05-21 NOTE — Transfer of Care (Signed)
Immediate Anesthesia Transfer of Care Note  Patient: Morgan Ramsey  Procedure(s) Performed:  CESAREAN SECTION - Primary cesarean section with delivery of baby girl at 99. Apgars 9/9.  Patient Location: PACU  Anesthesia Type: Epidural  Level of Consciousness: awake, alert  and oriented  Airway & Oxygen Therapy: Patient Spontanous Breathing  Post-op Assessment: Report given to PACU RN and Post -op Vital signs reviewed and stable  Post vital signs: Reviewed and stable  Complications: No apparent anesthesia complications

## 2011-05-21 NOTE — Progress Notes (Signed)
Marlynn Perking CNM notified of elevated temps

## 2011-05-21 NOTE — Progress Notes (Signed)
This note also relates to the following rows which could not be included: SpO2 - Cannot attach notes to rows marked as read only B/p low, phenylepherine given per orders, bolus given, pt throwing up.

## 2011-05-21 NOTE — Progress Notes (Signed)
Foley drg cherry red urine

## 2011-05-21 NOTE — Progress Notes (Signed)
Foley drg blood tinged urine

## 2011-05-21 NOTE — Progress Notes (Signed)
Morgan Ramsey is a 35 y.o. G3P1011 at [redacted]w[redacted]d by LMP admitted for induction of labor due to Post dates. Due date due 9/26.  Subjective:  The patient has progressed very slowly to complete dilation shortly after 9 am this morning.  SHe was allowed to 'labor down".  At my exam at approx 11:30 she is noted to still be C/C/-2 with the foley bulb below the vertex, able to be dislodged and to slip above the vertex. Vertex is suspected to be asynclytic, with occiput high in ROA position. When pt pushes, I am able to flex the vertex slightly, sufficiently that her pushing efforts resulted in expulsion of generous amt of stool per rectum, suggesting descent of vertex.  I am strongly suspicious that asynclitism will prevent descent, but will monitor pt very closely during this pushing phase to assess adequacy of progress.  Pt is pushing well.  External fetal and contraction monitoring in place.  Pt will need to make dramatically improved progress in descent, or cesarean will be recommended.  We have discussed cesarean , thru interpreter,  And will discuss further.  Additionally, pt has developed reduced urinary output , interpreted as mild dehydration secondary to labor, and 1 liter IV fluid bolus has been given.  Urine is bloody from earlier foley bulb position and pressure from vertex.  Urine volume slightly inproved since bolus.  Also, pt has had temp to 100.9, with Fhr 160's reactive, so will treat for chorioamnionitis. Ampicillin2 gm ordered, GEntamicin as per pharmacy ordered.    s\\\Objective: BP 132/55  Pulse 109  Temp(Src) 100.9 F (38.3 C) (Oral)  Resp 20  Ht 5\' 6"  (1.676 m)  Wt 105.235 kg (232 lb)  BMI 37.45 kg/m2  LMP 08/03/2010   Total I/O In: -  Out: 150 [Urine:150]  FHT:  FHR: 160 bpm, variability: moderate,  accelerations:  Present,  decelerations:  Absent UC:   regular, every 2-3 minutes SVE:   Dilation: 10 Effacement (%): 100 Station: +1 (caput) Exam by:: Dr  Emelda Fear  Labs: Lab Results  Component Value Date   WBC 9.1 05/19/2011   HGB 11.7* 05/19/2011   HCT 35.3* 05/19/2011   MCV 70.7* 05/19/2011   PLT 207 05/19/2011    Assessment / Plan: Induction of labor due to postdates pregnancy,  progressing well on pitocin  Labor: Suboptimal progress, expect prompt descent of vertex, or else will recomend cesarean. Preeclampsia:  no signs or symptoms of toxicity Fetal Wellbeing:  Category I Pain Control:  Epidural I/D:  n/a Anticipated MOD:  to be determined shortly  Laveah Gloster V 05/21/2011, 12:05 PM

## 2011-05-21 NOTE — Anesthesia Procedure Notes (Signed)
Epidural Patient location during procedure: OB Start time: 05/21/2011 3:32 AM End time: 05/21/2011 3:37 AM Reason for block: procedure for pain  Staffing Anesthesiologist: Sandrea Hughs Performed by: anesthesiologist   Preanesthetic Checklist Completed: patient identified, site marked, surgical consent, pre-op evaluation, timeout performed, IV checked, risks and benefits discussed and monitors and equipment checked  Epidural Patient position: sitting Prep: site prepped and draped and DuraPrep Patient monitoring: continuous pulse ox and blood pressure Approach: midline Injection technique: LOR air  Needle:  Needle type: Tuohy  Needle gauge: 17 G Needle length: 9 cm Needle insertion depth: 7 cm Catheter type: closed end flexible Catheter size: 19 Gauge Catheter at skin depth: 13 cm Test dose: negative and 1.5% lidocaine  Assessment Sensory level: T8 Events: blood not aspirated, injection not painful, no injection resistance, negative IV test and no paresthesia

## 2011-05-21 NOTE — Progress Notes (Signed)
Morgan Ramsey is a 35 y.o. G3P1011 at [redacted]w[redacted]d by LMP admitted for induction of labor due to Post dates. Due date 05/10/11.  Subjective: Pt post receiving epidural; SROM 0130.  Objective: BP 108/57  Pulse 81  Temp(Src) 98.4 F (36.9 C) (Oral)  Resp 18  Ht 5\' 6"  (1.676 m)  Wt 105.235 kg (232 lb)  BMI 37.45 kg/m2  LMP 08/03/2010      FHT:  FHR: 120 bpm, variability: moderate,  accelerations:  Present,  decelerations:  Present early UC:   regular, Ramsey 2-5 minutes SVE:   Dilation: 5 Effacement (%): 80 Station: -3 Exam by:: B.Cagna,RN  Labs: Lab Results  Component Value Date   WBC 9.1 05/19/2011   HGB 11.7* 05/19/2011   HCT 35.3* 05/19/2011   MCV 70.7* 05/19/2011   PLT 207 05/19/2011    Assessment / Plan: Induction of labor due to postterm,  progressing well on pitocin  Labor: Progressing on Pitocin, will continue to increase then AROM Preeclampsia:  n/a Fetal Wellbeing:  Category II Pain Control:  Epidural I/D:  n/a Anticipated MOD:  NSVD  [name] 05/21/2011, 7:01 AM

## 2011-05-21 NOTE — Progress Notes (Signed)
Morgan Ramsey is a 35 y.o. G3P1011 at [redacted]w[redacted]d by LMP admitted for induction of labor due to Post dates. Due date 05/10/11.  Subjective: Pt sleep with epidural.  Objective:     117/71, 98.4, 81, 18 FHT:  FHR: 130 bpm, variability: moderate,  accelerations:  Present,  decelerations:  Present variable UC:   regular, every 2-4 minutes SVE:   Dilation: 7 Effacement (%): 90 Station: -2 Exam by:: B.Cagna,RN  Labs: Lab Results  Component Value Date   WBC 9.1 05/19/2011   HGB 11.7* 05/19/2011   HCT 35.3* 05/19/2011   MCV 70.7* 05/19/2011   PLT 207 05/19/2011    Assessment / Plan: Induction of labor due to postterm,  progressing well on pitocin  Labor: Progressing on Pitocin Preeclampsia:  n/a Fetal Wellbeing:  Category II Pain Control:  Epidural I/D:  n/a Anticipated MOD:  NSVD  [facility] 05/21/2011, 7:18 AM

## 2011-05-21 NOTE — Progress Notes (Signed)
MD stepped out CNM at bedside

## 2011-05-21 NOTE — Progress Notes (Signed)
Dr. Ferguson at bedside. 

## 2011-05-21 NOTE — Anesthesia Preprocedure Evaluation (Signed)
Anesthesia Evaluation  Name, MR# and DOB Patient awake  General Assessment Comment  Reviewed: Allergy & Precautions, H&P , Patient's Chart, lab work & pertinent test results  Airway Mallampati: III TM Distance: >3 FB Neck ROM: full    Dental No notable dental hx.    Pulmonary    Pulmonary exam normal       Cardiovascular     Neuro/Psych Negative Neurological ROS  Negative Psych ROS   GI/Hepatic negative GI ROS Neg liver ROS    Endo/Other  Morbid obesity  Renal/GU negative Renal ROS     Musculoskeletal negative musculoskeletal ROS (+)   Abdominal (+) obese,   Peds  Hematology negative hematology ROS (+)   Anesthesia Other Findings   Reproductive/Obstetrics (+) Pregnancy                           Anesthesia Physical Anesthesia Plan  ASA: III  Anesthesia Plan: Epidural   Post-op Pain Management:    Induction:   Airway Management Planned:   Additional Equipment:   Intra-op Plan:   Post-operative Plan:   Informed Consent: I have reviewed the patients History and Physical, chart, labs and discussed the procedure including the risks, benefits and alternatives for the proposed anesthesia with the patient or authorized representative who has indicated his/her understanding and acceptance.     Plan Discussed with:   Anesthesia Plan Comments:         Anesthesia Quick Evaluation

## 2011-05-21 NOTE — Progress Notes (Signed)
MD at bedside. 

## 2011-05-21 NOTE — Consult Note (Signed)
Neonatology Note:   Attendance at C-section:    I was asked to attend this primary C/S at 41 2/7 weeks due to failed induction, CPD. The mother is a G3P1A1 O pos, GBS neg. ROM 30 hours PTD, fluid bloody. Infant vigorous with good spontaneous cry and tone. Needed only minimal bulb suctioning. Ap 9/9. Lungs clear to ausc in DR. To CN to care of Pediatrician.   Deatra James, MD

## 2011-05-21 NOTE — Progress Notes (Signed)
Interpretor in room with Dr. Arby Barrette to translate.  Monitors off for procedure.

## 2011-05-21 NOTE — Consult Note (Signed)
ANTIBIOTIC CONSULT NOTE - INITIAL  Pharmacy Consult for gentamicin Indication:maternal temp   No Known Allergies  Patient Measurements: Height: 5\' 6"  (167.6 cm) Weight: 232 lb (105.235 kg) IBW/kg (Calculated) : 59.3  Adjusted Body Weight: 73.1kg  Vital Signs: Temp: 100.9 F (38.3 C) (10/07 1155) Temp src: Oral (10/07 1155) BP: 132/55 mmHg (10/07 1201) Pulse Rate: 109  (10/07 1201) Intake/Output from previous day:   Intake/Output from this shift: Total I/O In: -  Out: 150 [Urine:150]  Labs:  [facility] 05/19/11 2035  WBC 9.1  HGB 11.7*  PLT 207  LABCREA --  CREATININE --   CrCl is unknown because no creatinine reading has been taken. No results found for this basename: VANCOTROUGH:2,VANCOPEAK:2,VANCORANDOM:2,GENTTROUGH:2,GENTPEAK:2,GENTRANDOM:2,TOBRATROUGH:2,TOBRAPEAK:2,TOBRARND:2,AMIKACINPEAK:2,AMIKACINTROU:2,AMIKACIN:2, in the last 72 hours   Microbiology: No results found for this or any previous visit (from the past 720 hour(s)).  Medical History: Past Medical History  Diagnosis Date  . History of chlamydia 2008  . AMA (advanced maternal age) multigravida 35+   . Obese   . History of candidal vulvovaginitis     Medications:  Ampicillin  2 grams IV x 1 Assessment: 41 4/[redacted] weeks pregnant with maternal temp  Goal of Therapy:  Cpeak 6-8mg /L and trough < 1 mg/L  Plan:  Gentamicin 190mg  IV every 8 hours.  Will check levels if gentamicin continued > 72 hours or as clinically indicated.  Will check SCr if continue post-partum > 24 hours.  Berlin Hun D 05/21/2011,12:14 PM

## 2011-05-21 NOTE — Progress Notes (Signed)
MD and CMN at bedside

## 2011-05-21 NOTE — Brief Op Note (Signed)
05/21/2011  3:09 PM  PATIENT:  Morgan Ramsey  35 y.o. female  PRE-OPERATIVE DIAGNOSIS:  cephalopelvic disproportion  POST-OPERATIVE DIAGNOSIS:  cephalopelvic disproportion occiput posterior presentation PROCEDURE:  Procedure(s): CESAREAN SECTION  Primary low transverse cesarean, with manual removal of placenta  SURGEON:  Surgeon(s): Tilda Burrow, MD  PHYSICIAN ASSISTANT:   ASSISTANTSGwendolyn Grant MD   ANESTHESIA:   local and epidural  OR FLUID I/O:  Total I/O In: 1100 [I.V.:1100] Out: 1500 [Urine:300; Blood:1200]  BLOOD ADMINISTERED:none  DRAINS: Urinary Catheter (Foley)   LOCAL MEDICATIONS USED:  MARCAINE 20CC 0.25%  SPECIMEN:  Source of Specimen:  placenta  DISPOSITION OF SPECIMEN:  PATHOLOGY  COUNTS:  YES  TOURNIQUET:  * No tourniquets in log *  DICTATION: .Other Dictation: Dictation Number [id number]  PLAN OF CARE: Admit to inpatient   PATIENT DISPOSITION:  PACU - hemodynamically stable.   Delay start of Pharmacological VTE agent (>24hrs) due to surgical blood loss or risk of bleeding:  not applicable

## 2011-05-21 NOTE — Progress Notes (Signed)
Dr Emelda Fear notified of temp

## 2011-05-21 NOTE — Progress Notes (Signed)
K. Shaw,CNM on unit, reviews strip, update given.  Pt may eat and than can go up on Pitocin

## 2011-05-21 NOTE — Progress Notes (Signed)
la  Subjective:Pt has pushed excellently x 1 hour   But vertex returns to -2 position between contractions despite descent with contractions.    I feel risks off shoulder dystocia are excessive to proceed. Cesarean recommended.  Risks, of prrocedure including bleeding, infection, transfusion discussed with patient and partner thru interpreter.    To OR for cesarean.  OR notified. Consent signed    Objective: BP 123/71  Pulse 105  Temp(Src) 100.9 F (38.3 C) (Oral)  Resp 18  Ht 5\' 6"  (1.676 m)  Wt 105.235 kg (232 lb)  BMI 37.45 kg/m2  LMP 08/03/2010   Total I/O In: -  Out: 150 [Urine:150]   Labs: Lab Results  Component Value Date   WBC 9.1 05/19/2011   HGB 11.7* 05/19/2011   HCT 35.3* 05/19/2011   MCV 70.7* 05/19/2011   PLT 207 05/19/2011    Assessment / Plan: Arrest of decent  Labor: Cephalopelvic disproportion Preeclampsia:  no signs or symptoms of toxicity Fetal Wellbeing:  Category I Pain Control:  Epidural I/D:  n/a Anticipated MOD:  cesarean section  Jerzee Jerome V 05/21/2011, 1:05 PM

## 2011-05-21 NOTE — Anesthesia Postprocedure Evaluation (Signed)
Anesthesia Post Note  Patient: Morgan Ramsey  Procedure(s) Performed:  CESAREAN SECTION - Primary cesarean section with delivery of baby girl at 34. Apgars 9/9.  Anesthesia type: Epidural  Patient location: PACU  Post pain: Pain level controlled  Post assessment: Post-op Vital signs reviewed  Last Vitals:  Filed Vitals:   05/21/11 1515  BP: 107/55  Pulse: 94  Temp:   Resp: 20    Post vital signs: stable  Level of consciousness: awake  Complications: No apparent anesthesia complications

## 2011-05-21 NOTE — Progress Notes (Signed)
MD and cmn AT [facility]

## 2011-05-21 NOTE — Progress Notes (Signed)
Called Interpretor to room to answer pain questions.  Pt states she just want IV pain medicine for now and possibly epidural later.

## 2011-05-21 NOTE — Progress Notes (Signed)
Morgan Ramsey is a 35 y.o. G3P1011 at [redacted]w[redacted]d by ultrasound admitted for induction of labor due to Post dates.   Subjective:   Objective: BP 135/69  Pulse 90  Temp(Src) 99.7 F (37.6 C) (Oral)  Resp 18  Ht 5\' 6"  (1.676 m)  Wt 105.235 kg (232 lb)  BMI 37.45 kg/m2  LMP 08/03/2010      FHT:  FHR: 145 bpm, variability: moderate,  accelerations:  Present,  decelerations:  Absent UC:   regular, every 2  minutes SVE:   Dilation: 7 Effacement (%): 90 Station: -2 Exam by:: B.Cagna,RN  Labs: Lab Results  Component Value Date   WBC 9.1 05/19/2011   HGB 11.7* 05/19/2011   HCT 35.3* 05/19/2011   MCV 70.7* 05/19/2011   PLT 207 05/19/2011    Assessment / Plan: Induction of labor due to postterm,  progressing well on pitocin  Labor: Progressing normally Preeclampsia:  no signs or symptoms of toxicity Fetal Wellbeing:  Category I Pain Control:  Epidural I/D:  n/a Anticipated MOD:  NSVD  Zerita Boers 05/21/2011, 9:09 AM

## 2011-05-21 NOTE — Progress Notes (Signed)
Dr Emelda Fear here with interpeter.  Explaining that baby may be too large to deliver vaginally

## 2011-05-21 NOTE — Progress Notes (Signed)
CNM stepped out, MD in room

## 2011-05-21 NOTE — Progress Notes (Signed)
pericare done.

## 2011-05-22 ENCOUNTER — Encounter (HOSPITAL_COMMUNITY): Payer: Self-pay | Admitting: Obstetrics and Gynecology

## 2011-05-22 LAB — CBC
HCT: 26.2 % — ABNORMAL LOW (ref 36.0–46.0)
Hemoglobin: 8.4 g/dL — ABNORMAL LOW (ref 12.0–15.0)
WBC: 12.8 10*3/uL — ABNORMAL HIGH (ref 4.0–10.5)

## 2011-05-22 MED ORDER — LACTATED RINGERS IV SOLN: Freq: Once | INTRAVENOUS | Status: DC

## 2011-05-22 NOTE — Anesthesia Postprocedure Evaluation (Signed)
  Anesthesia Post-op Note  Patient: Morgan Ramsey  Procedure(s) Performed:  CESAREAN SECTION - Primary cesarean section with delivery of baby girl at 70. Apgars 9/9.  Patient Location: Mother/Baby  Anesthesia Type: Epidural  Level of Consciousness: awake, alert  and oriented  Airway and Oxygen Therapy: Patient Spontanous Breathing  Post-op Pain: none  Post-op Assessment: Post-op Vital signs reviewed and Patient's Cardiovascular Status Stable  Post-op Vital Signs: Reviewed and stable  Complications: No apparent anesthesia complications

## 2011-05-22 NOTE — Progress Notes (Signed)
UR chart review completed.  

## 2011-05-22 NOTE — Progress Notes (Signed)
Encounter addended by: Madison Hickman on: 05/22/2011  8:19 AM<BR>     Documentation filed: Notes Section

## 2011-05-22 NOTE — Progress Notes (Signed)
Subjective: Postpartum Day 1: Cesarean Delivery Patient reports nausea and vomiting x 2 yesterday that has resolved, but still poor PO intake; dizzy while lying in bed, which is persistent since yesterday; she denies abdominal pain; minimal vaginal bleeding; + flatus   Baby girl in nursery receiving phototherapy   Objective: Vital signs in last 24 hours: Temp:  [97.5 F (36.4 C)-100.9 F (38.3 C)] 98.5 F (36.9 C) (10/08 0256) Pulse Rate:  [76-120] 97  (10/08 0256) Resp:  [16-24] 16  (10/08 0100) BP: (105-145)/(55-113) 123/78 mmHg (10/08 0256) SpO2:  [94 %-100 %] 95 % (10/08 0256)   Physical Exam:  General: alert, cooperative and no distress Mouth: Dry Lochia: appropriate Uterine Fundus: firm Incision: healing well, bandaged  DVT Evaluation: No evidence of DVT seen on physical exam.   Basename 05/21/11 1750 05/19/11 2035  HGB 9.9* 11.7*  HCT 30.5* 35.3*    Assessment/Plan: Status post Cesarean section. Doing well postoperatively.  Continue current care. Give 500 mL of lactated ringer x 1 for dizziness and poor PO intake;saline lock IV thereafter.   Morgan Ramsey, Morgan Ramsey 05/22/2011, 5:21 AM

## 2011-05-22 NOTE — Op Note (Signed)
Morgan Ramsey, Morgan Ramsey        ACCOUNT NO.:  [id number]  MEDICAL RECORD NO.:  [id number]  LOCATION:  9101                          FACILITY:  [facility]  PHYSICIAN:  Tilda Burrow, M.D. DATE OF BIRTH:  1975/10/16  DATE OF PROCEDURE:  05/21/2011 DATE OF DISCHARGE:                              OPERATIVE REPORT   PREOPERATIVE DIAGNOSES:  Pregnancy 41 weeks' 2 days, failed induction of labor due to postdates pregnancy, cephalopelvic disproportion. Chorioamnionitis.  POSTOPERATIVE DIAGNOSES:  Pregnancy 41 weeks' 2 days, failed induction of labor due to postdates pregnancy, cephalopelvic disproportion. Chorioamnionitis.  PROCEDURE:  Primary low-transverse cervical cesarean section, repair of the inferior left-sided lower uterine segment extension of incision.  ANESTHESIA:  Epidural, with local subcutaneous in the skin incision, Marcaine 20 mL.  Nesacaine 30 mL intraperitoneal location.  INDICATIONS:  A 35 year old female gravida 3, para 2-0-1-2, prior vaginal delivery, with arrest of labor at complete complete -2.  FINDINGS:  A 9-pound, 6-ounce female infant, Apgars 9 and 9 in right occiput posterior position.  DETAILS OF PROCEDURE:  The patient having the diagnosis of cephalopelvic disproportion made and was taken to the operating room.  Previously, she had been placed on antibiotics for chorioamnionitis immediately prior to procedure, so this was used as the preoperative antibiotics.  Time-out was conducted and procedure confirmed by involved parties.  Renold Don, MD, was assistant.  A transverse lower abdominal incision was made method in standard method of Pfannenstiel.  Fetal vertex was palpated as very low in the lower uterine segment.  Bladder flap was developed and transverse nick made in lower uterine segment, extended laterally using index finger traction and we entered the abdominal cavity at the level of the fetal neck.  The shoulder was identifiable, the left  shoulder which was pushed upward while the operator's left hand was introduced beneath the vertex and the vertex rotated clockwise into the incision and delivered easily thereafter.  There was some inferior tearing of the incision likely related to the manual repositioning of the head.  Cord was clamped and the infant placed in the care of pediatrician.  See their notes for details.  There was no malodor of the amniotic fluid. The baby did not feel warm.  The cord blood sample was obtained.  Ring forceps were immediately used upon delivery of the baby to grasp the generous bleeding area at the left lower uterine segment portion of the incision.  Hemostasis was adequately controlled and then the uterus massaged sufficiently to eventually result in uterine contraction.  The placenta required manual extraction after it would not respond to Crede uterine massage for several minutes.  The uterus was emptied, placenta was intact, and membranes were removed intact.  Ring forceps were placed across the incision.  A 4x4 lap was moistened, placed behind the broad ligament on the patient's left side to maintain proper orientation and move the uterus away from lateral pelvic structures.  The incision was inspected. There was an inferior tear on the left side just anterior to the uterine vessels.  The uterine artery was easily visible on the left side.  The uterine vessel was ligated above and below the incision using with 0 chromic suture sewing from lateral to medial for  safety while retracting the uterine tissues medially with ring forceps.  Hemostasis was dramatically improved.  The inferior extension on the left side, which does not extend below the level of the cervix was sewed again carefully sewing from outside the end and closed satisfactorily.  On the right side, there was a much smaller tear of approximately 2-3 cm which was closed with a figure-of-eight suture on the side.  The remaining  lower uterine segment was closed with two-layer running locking first layer closure and continuous running second layer, all with 0 Vicryl. Inspection was showing satisfactory hemostasis except a small area of the middle lower uterine segment which required a single inferior incision.  This was assisted with a transverse vertical mattress suture. Pressure was applied for few minutes and hemostasis felt satisfactory. Bladder flap was loosely reapproximated using 3 interrupted 2-0 Vicryl sutures.  The abdomen was irrigated, inspected, and confirmed as hemostatic. Anterior peritoneum was closed after all laparotomy sponge counts were correct.  The anterior peritoneum was closed with running 2-0 chromic. The patient was uncomfortable about this time of the case and required some Nesacaine 30 mL instilled into the abdominal cavity to assist with discomfort.  Anterior peritoneum was closed with 2-0 Vicryl.  Fascia was closed with 0 Vicryl.  Subcu tissues were loosely reapproximated with 1 interrupted suture of chromic and then the skin was closed with subcuticular 4-0 Vicryl on a Keith needle.  Sponge and needle counts were correct.  Estimated blood loss 1200 mL primarily from the left lower uterine segment defect.  The patient went to the recovery room in stable condition.  Urine output during the case remained low as the patient was considered significantly dehydrated prior to procedure. This will be monitored postop and checked this evening.     Tilda Burrow, M.D.     JVF/MEDQ  D:  05/21/2011  T:  05/22/2011  Job:  [id number]

## 2011-05-23 NOTE — Progress Notes (Signed)
Subjective: Postpartum Day 2: Cesarean Delivery Patient reports incisional pain.  The patient's dizziness has resolved.   Objective: Vital signs in last 24 hours: Temp:  [97.8 F (36.6 C)-98.9 F (37.2 C)] 98.5 F (36.9 C) (10/08 2100) Pulse Rate:  [88-101] 101  (10/08 2100) Resp:  [18-24] 18  (10/08 2100) BP: (107-119)/(67-75) 107/73 mmHg (10/08 2100) SpO2:  [97 %-99 %] 99 % (10/08 1420)  Physical Exam:  General: alert, cooperative, no distress and mildly obese Lochia: appropriate Uterine Fundus: firm Incision: healing well, no significant drainage, no dehiscence DVT Evaluation: No evidence of DVT seen on physical exam.   Basename 05/22/11 0515 05/21/11 1750  HGB 8.4* 9.9*  HCT 26.2* 30.5*    Assessment/Plan: Status post Cesarean section. Doing well postoperatively.  Continue current care. Patient wants to use OCP for contraception.    Clinton Sawyer, Keymoni Mccaster 05/23/2011, 5:43 AM

## 2011-05-24 MED ORDER — PRENATAL PLUS 27-1 MG PO TABS: 1.0000 | ORAL_TABLET | Freq: Every day | ORAL | Status: DC

## 2011-05-24 MED ORDER — OXYCODONE-ACETAMINOPHEN 5-325 MG PO TABS
1.0000 | ORAL_TABLET | ORAL | Status: AC | PRN
Start: 2011-05-24 — End: 2011-06-03

## 2011-05-24 MED ORDER — INFLUENZA VIRUS VACC SPLIT PF IM SUSP
0.5000 mL | Freq: Once | INTRAMUSCULAR | Status: AC
  Administered 2011-05-24: 0.5 mL via INTRAMUSCULAR
  Filled 2011-05-24: qty 0.5

## 2011-05-24 NOTE — Progress Notes (Signed)
Discharge teaching done w/interpreter. [facility] brought bili lights and teaching done w/interpreter. Incision WDL. Baby care teaching done w/interpreter. special attention on temps. During bili light therapy. States has good understanding.

## 2011-05-24 NOTE — Progress Notes (Signed)
3 Days Post-Op Procedure(s): CESAREAN SECTION  Subjective: Patient reports incisional pain.  Pain is well controlled with Percocet.   Objective: I have reviewed patient's vital signs and medications. Temp:  [98 F (36.7 C)-98.5 F (36.9 C)] 98.5 F (36.9 C) (10/10 0556) Pulse Rate:  [87-94] 87  (10/10 0556) Resp:  [18] 18  (10/10 0556) BP: (110-127)/(73-86) 127/86 mmHg (10/10 0556)    General: alert, cooperative, no distress and moderately obese Extremities: extremities normal, atraumatic, no cyanosis or edema Vaginal Bleeding: minimal  Assessment: s/p Procedure(s): CESAREAN SECTION: stable and progressing well  Plan: Discharge home  LOS: 5 days    Avigail Pilling 05/24/2011, 7:23 AM

## 2011-05-24 NOTE — Discharge Summary (Signed)
Obstetric Discharge Summary Reason for Admission: induction of labor for postdates  Date of Admission: 05/19/11 Prenatal Procedures: ultrasound Intrapartum Procedures: cesarean: low cervical, transverse Postpartum Procedures: none Complications-Operative and Postpartum: none Hemoglobin  Date Value Range Status  05/22/2011 8.4* 12.0-15.0 (g/dL) Final     HCT  Date Value Range Status  05/22/2011 26.2* 36.0-46.0 (%) Final   Hospital Course: Mrs. Morgan Ramsey is a 35 year old [id number] who presented for IOL for post-dates. The attempted IOL with pitocin failed likely due to cephalopelvic disproportion. Therefore, a LTCS was performed on 05/21/11 by Dr. Sherryl Manges. The procedure was successful. The post-operative course was complicated only by patient lightheadedness, which resolved after a 1L fluiid bolus of LR. Mrs. Morgan Ramsey's incision is healing well on the day of discharge. She is appropriate for discharge to home. She will breast feed her infant female and will use OCP for contraception in the future. She will be following up at [facility] Department.   Discharge Diagnoses: Failed induction, Post-date pregnancy and Cesarean Delivery for Cephalopelvic Disproportion   Discharge Information: Date: 05/24/2011 Activity: pelvic rest Diet: routine Medications: Percocet Condition: stable Instructions: refer to practice specific booklet Discharge to: home   Newborn Data: Live born female  Birth Weight: 9 lb 6.1 oz (4255 g) APGAR: 9, 9  Home with mother.  Morgan Ramsey 05/24/2011, 7:28 AM

## 2012-08-10 ENCOUNTER — Encounter (HOSPITAL_COMMUNITY): Payer: Self-pay | Admitting: *Deleted

## 2012-08-10 ENCOUNTER — Emergency Department (HOSPITAL_COMMUNITY)
Admission: EM | Admit: 2012-08-10 | Discharge: 2012-08-10 | Disposition: A | Discharge status: Home / Self Care | Payer: Self-pay | Attending: Emergency Medicine | Admitting: Emergency Medicine

## 2012-08-10 ENCOUNTER — Emergency Department (HOSPITAL_COMMUNITY): Payer: Self-pay

## 2012-08-10 DIAGNOSIS — R6889 Other general symptoms and signs: Secondary | ICD-10-CM

## 2012-08-10 DIAGNOSIS — Z3202 Encounter for pregnancy test, result negative: Secondary | ICD-10-CM | POA: Insufficient documentation

## 2012-08-10 DIAGNOSIS — R109 Unspecified abdominal pain: Secondary | ICD-10-CM | POA: Insufficient documentation

## 2012-08-10 DIAGNOSIS — R05 Cough: Secondary | ICD-10-CM | POA: Insufficient documentation

## 2012-08-10 DIAGNOSIS — J029 Acute pharyngitis, unspecified: Secondary | ICD-10-CM | POA: Insufficient documentation

## 2012-08-10 DIAGNOSIS — R059 Cough, unspecified: Secondary | ICD-10-CM | POA: Insufficient documentation

## 2012-08-10 DIAGNOSIS — R42 Dizziness and giddiness: Secondary | ICD-10-CM | POA: Insufficient documentation

## 2012-08-10 DIAGNOSIS — R51 Headache: Secondary | ICD-10-CM | POA: Insufficient documentation

## 2012-08-10 DIAGNOSIS — R509 Fever, unspecified: Secondary | ICD-10-CM | POA: Insufficient documentation

## 2012-08-10 DIAGNOSIS — R0602 Shortness of breath: Secondary | ICD-10-CM | POA: Insufficient documentation

## 2012-08-10 DIAGNOSIS — Z8619 Personal history of other infectious and parasitic diseases: Secondary | ICD-10-CM | POA: Insufficient documentation

## 2012-08-10 DIAGNOSIS — IMO0001 Reserved for inherently not codable concepts without codable children: Secondary | ICD-10-CM | POA: Insufficient documentation

## 2012-08-10 DIAGNOSIS — E669 Obesity, unspecified: Secondary | ICD-10-CM | POA: Insufficient documentation

## 2012-08-10 LAB — URINALYSIS, ROUTINE W REFLEX MICROSCOPIC
Glucose, UA: NEGATIVE mg/dL
Specific Gravity, Urine: 1.015 (ref 1.005–1.030)
Urobilinogen, UA: 0.2 mg/dL (ref 0.0–1.0)

## 2012-08-10 LAB — CBC WITH DIFFERENTIAL/PLATELET
Basophils Relative: 0 % (ref 0–1)
HCT: 37.3 % (ref 36.0–46.0)
Hemoglobin: 12 g/dL (ref 12.0–15.0)
Lymphocytes Relative: 9 % — ABNORMAL LOW (ref 12–46)
Monocytes Relative: 13 % — ABNORMAL HIGH (ref 3–12)
Neutro Abs: 6.9 10*3/uL (ref 1.7–7.7)
WBC: 8.8 10*3/uL (ref 4.0–10.5)

## 2012-08-10 LAB — POCT I-STAT, CHEM 8
BUN: 7 mg/dL (ref 6–23)
Chloride: 104 mEq/L (ref 96–112)
HCT: 39 % (ref 36.0–46.0)
Sodium: 139 mEq/L (ref 135–145)
TCO2: 24 mmol/L (ref 0–100)

## 2012-08-10 LAB — URINE MICROSCOPIC-ADD ON

## 2012-08-10 LAB — POCT PREGNANCY, URINE: Preg Test, Ur: NEGATIVE

## 2012-08-10 MED ORDER — ACETAMINOPHEN 325 MG PO TABS
650.0000 mg | ORAL_TABLET | Freq: Once | ORAL | Status: AC
  Administered 2012-08-10: 650 mg via ORAL
  Filled 2012-08-10: qty 2

## 2012-08-10 NOTE — ED Notes (Signed)
Alert, NAD, calm, interactive, skin warm and moist, resps e/u, LS CTA, speaking in clear complete sentences, speaks Spanish, little [place]. C/o fever (wearing sweatshirt), cough, some sob, some dizziness, HA. (denies: nvd, sore throat, CP), took 1 advil at ~ 1900. Has taken tylenol Saturday morning. Breastfeeding for 1 year. LMP in mid December.

## 2012-08-10 NOTE — ED Provider Notes (Signed)
History     CSN: [id number]  Arrival date & time 08/10/12  2132   First MD Initiated Contact with Patient 08/10/12 2301      Chief Complaint  Patient presents with  . Cough  . Headache  . Fever  . Dizziness    Patient is a 36 y.o. female presenting with cough. The history is provided by the patient. A language interpreter was used (spanish phone interpreter).  Cough This is a new problem. The current episode started yesterday. The problem occurs hourly. The problem has been gradually worsening. The cough is non-productive. The maximum temperature recorded prior to her arrival was 100 to 100.9 F. Associated symptoms include chills, headaches, myalgias and shortness of breath. Pertinent negatives include no chest pain. Treatments tried: rest. The treatment provided mild relief.  pt reports cough, headache, myalgias, sore throat, abdominal pain and mild SOB that started yesterday No vomiting/diarrhea No rash.  She has been breastfeeding but no breast tenderness/pain reported. No recent travel No rash reported   Past Medical History  Diagnosis Date  . History of chlamydia 2008  . AMA (advanced maternal age) multigravida 35+   . Obese   . History of candidal vulvovaginitis     Past Surgical History  Procedure Date  . Cesarean section 05/21/2011    Procedure: CESAREAN SECTION;  Surgeon: Tilda Burrow, MD;  Location: [facility];  Service: Gynecology;  Laterality: N/A;  Primary cesarean section with delivery of baby girl at 19. Apgars 9/9.    Family History  Problem Relation Age of Onset  . Diabetes Mother     oral agents  . Hypertension Mother   . Anesthesia problems Neg Hx   . Hypotension Neg Hx   . Malignant hyperthermia Neg Hx   . Pseudochol deficiency Neg Hx     History  Substance Use Topics  . Smoking status: Never Smoker   . Smokeless tobacco: Not on file  . Alcohol Use: No    OB History    Grav Para Term Preterm Abortions TAB SAB Ect Mult Living   3 2 2  1  1    2       Review of Systems  Constitutional: Positive for chills.  Respiratory: Positive for cough and shortness of breath.   Cardiovascular: Negative for chest pain.  Gastrointestinal: Negative for vomiting and diarrhea.  Musculoskeletal: Positive for myalgias.  Skin: Negative for rash.  Neurological: Positive for headaches.  Psychiatric/Behavioral: Negative for agitation.  All other systems reviewed and are negative.    Allergies  Review of patient's allergies indicates no known allergies.  Home Medications   Current Outpatient Rx  Name  Route  Sig  Dispense  Refill  . ACETAMINOPHEN 500 MG PO TABS   Oral   Take 1,000 mg by mouth every 6 (six) hours as needed. Pain or fever         . IBUPROFEN 200 MG PO TABS   Oral   Take 200 mg by mouth every 6 (six) hours as needed. Pain or fever         . PRENATAL PLUS 27-1 MG PO TABS   Oral   Take 1 tablet by mouth daily.   30 each        BP 115/71  Pulse 95  Temp 98.6 F (37 C) (Oral)  Resp 24  SpO2 99%  LMP 08/03/2012  Breastfeeding? Yes  Physical Exam CONSTITUTIONAL: Well developed/well nourished HEAD AND FACE: Normocephalic/atraumatic EYES: EOMI/PERRL ENMT: Mucous membranes moist,  pharynx non erythematous NECK: supple no meningeal signs SPINE:entire spine nontender CV: S1/S2 noted, no murmurs/rubs/gallops noted LUNGS: Lungs are clear to auscultation bilaterally, no apparent distress ABDOMEN: soft, nontender, no rebound or guarding GU:no cva tenderness NEURO: Pt is awake/alert, moves all extremitiesx4 EXTREMITIES: pulses normal, full ROM SKIN: warm, color normal PSYCH: no abnormalities of mood noted  ED Course  Procedures   Labs Reviewed  CBC WITH DIFFERENTIAL - Abnormal; Notable for the following:    RBC 5.37 (*)     MCV 69.5 (*)     MCH 22.3 (*)     Neutrophils Relative 78 (*)     Lymphocytes Relative 9 (*)     Monocytes Relative 13 (*)     Monocytes Absolute 1.1 (*)     All other components  within normal limits  URINALYSIS, ROUTINE W REFLEX MICROSCOPIC - Abnormal; Notable for the following:    Hgb urine dipstick TRACE (*)     All other components within normal limits  POCT I-STAT, CHEM 8 - Abnormal; Notable for the following:    Glucose, Bld 102 (*)     All other components within normal limits  URINE MICROSCOPIC-ADD ON - Abnormal; Notable for the following:    Squamous Epithelial / LPF FEW (*)     All other components within normal limits  POCT PREGNANCY, URINE   Dg Chest 2 View  08/10/2012  *RADIOLOGY REPORT*  Clinical Data: Cough, headache, fever and dizziness.  Earaches.  CHEST - 2 VIEW  Comparison: None.  Findings: The lungs are well-aerated and clear.  There is no evidence of focal opacification, pleural effusion or pneumothorax. Apparent minimal density overlying the left midlung zone is thought to reflect a confluence of ribs.  The heart is normal in size; the mediastinal contour is within normal limits.  No acute osseous abnormalities are seen.  IMPRESSION: No acute cardiopulmonary process seen.   Original Report Authenticated By: Tonia Ghent, M.D.      1. Flu-like symptoms    1:01 AM Pt well appearing, no distress, vitals improved, likely flu like illness, I doubt meningitis.  I discussed via phone interpreter likely course of illness.  Pt agreeable with plan She has no signs of resp distress    MDM  Nursing notes including past medical history and social history reviewed and considered in documentation Labs/vital reviewed and considered xrays reviewed and considered         Joya Gaskins, MD 08/11/12 0102

## 2013-07-13 DIAGNOSIS — E669 Obesity, unspecified: Secondary | ICD-10-CM | POA: Insufficient documentation

## 2013-07-13 DIAGNOSIS — Z3202 Encounter for pregnancy test, result negative: Secondary | ICD-10-CM | POA: Insufficient documentation

## 2013-07-13 DIAGNOSIS — R319 Hematuria, unspecified: Secondary | ICD-10-CM | POA: Insufficient documentation

## 2013-07-13 DIAGNOSIS — M545 Low back pain, unspecified: Secondary | ICD-10-CM | POA: Insufficient documentation

## 2013-07-13 DIAGNOSIS — Z8742 Personal history of other diseases of the female genital tract: Secondary | ICD-10-CM | POA: Insufficient documentation

## 2013-07-13 DIAGNOSIS — Z8619 Personal history of other infectious and parasitic diseases: Secondary | ICD-10-CM | POA: Insufficient documentation

## 2013-07-14 ENCOUNTER — Encounter (HOSPITAL_COMMUNITY): Payer: Self-pay | Admitting: Emergency Medicine

## 2013-07-14 ENCOUNTER — Emergency Department (HOSPITAL_COMMUNITY)
Admission: EM | Admit: 2013-07-14 | Discharge: 2013-07-14 | Disposition: A | Discharge status: Home / Self Care | Payer: Self-pay | Attending: Emergency Medicine | Admitting: Emergency Medicine

## 2013-07-14 ENCOUNTER — Emergency Department (HOSPITAL_COMMUNITY): Payer: Self-pay

## 2013-07-14 DIAGNOSIS — M545 Low back pain: Secondary | ICD-10-CM

## 2013-07-14 LAB — CBC WITH DIFFERENTIAL/PLATELET
Basophils Absolute: 0 10*3/uL (ref 0.0–0.1)
Eosinophils Absolute: 0.2 10*3/uL (ref 0.0–0.7)
HCT: 28.8 % — ABNORMAL LOW (ref 36.0–46.0)
Lymphocytes Relative: 28 % (ref 12–46)
Lymphs Abs: 3.1 10*3/uL (ref 0.7–4.0)
MCH: 18.9 pg — ABNORMAL LOW (ref 26.0–34.0)
MCHC: 30.2 g/dL (ref 30.0–36.0)
Monocytes Relative: 8 % (ref 3–12)
Neutro Abs: 6.8 10*3/uL (ref 1.7–7.7)
Platelets: 325 10*3/uL (ref 150–400)
RDW: 17.4 % — ABNORMAL HIGH (ref 11.5–15.5)
WBC: 11 10*3/uL — ABNORMAL HIGH (ref 4.0–10.5)

## 2013-07-14 LAB — BASIC METABOLIC PANEL
Chloride: 100 mEq/L (ref 96–112)
Creatinine, Ser: 1 mg/dL (ref 0.50–1.10)
GFR calc Af Amer: 82 mL/min — ABNORMAL LOW (ref >90)
Glucose, Bld: 98 mg/dL (ref 70–99)
Potassium: 3.5 mEq/L (ref 3.5–5.1)

## 2013-07-14 LAB — URINE MICROSCOPIC-ADD ON

## 2013-07-14 LAB — URINALYSIS, ROUTINE W REFLEX MICROSCOPIC
Bilirubin Urine: NEGATIVE
Glucose, UA: NEGATIVE mg/dL
Ketones, ur: NEGATIVE mg/dL
Protein, ur: NEGATIVE mg/dL

## 2013-07-14 LAB — OCCULT BLOOD, POC DEVICE: Fecal Occult Bld: NEGATIVE

## 2013-07-14 MED ORDER — GADOBENATE DIMEGLUMINE 529 MG/ML IV SOLN
20.0000 mL | Freq: Once | INTRAVENOUS | Status: AC | PRN
  Administered 2013-07-14: 20 mL via INTRAVENOUS

## 2013-07-14 MED ORDER — OXYCODONE-ACETAMINOPHEN 5-325 MG PO TABS: 1.0000 | ORAL_TABLET | Freq: Four times a day (QID) | ORAL | Status: DC | PRN

## 2013-07-14 MED ORDER — HYDROMORPHONE HCL PF 1 MG/ML IJ SOLN
1.0000 mg | Freq: Once | INTRAMUSCULAR | Status: AC
  Administered 2013-07-14: 1 mg via INTRAMUSCULAR
  Filled 2013-07-14: qty 1

## 2013-07-14 MED ORDER — KETOROLAC TROMETHAMINE 30 MG/ML IJ SOLN
30.0000 mg | Freq: Once | INTRAMUSCULAR | Status: AC
  Administered 2013-07-14: 30 mg via INTRAMUSCULAR
  Filled 2013-07-14: qty 1

## 2013-07-14 NOTE — ED Notes (Signed)
Pt states she has pain in Right lower back and upper back. Pt states pain started on last Tuesday.

## 2013-07-14 NOTE — ED Notes (Signed)
Pt. reports low back pain onset Thursday this week , denies dysuria or hematuria , no injury or fall , pain worse with movement and certain positions .

## 2013-07-14 NOTE — ED Provider Notes (Signed)
Medical screening examination/treatment/procedure(s) were conducted as a shared visit with non-physician practitioner(s) and myself.  I personally evaluated the patient during the encounter.  EKG Interpretation   None      Ct Abdomen Pelvis Wo Contrast  07/14/2013   *RADIOLOGY REPORT*  Clinical data: Acute onset lobe back pain  CT ABDOMEN AND PELVIS WITHOUT CONTRAST  Technique:  Multidetector CT imaging of the abdomen and pelvis was performed following the standard protocol without intravenous contrast.  Comparison: None.  Findings: Lung bases are clear.  Normal heart size.  Low attenuation of the blood pool suggests anemia.  Trace pericardial fluid.  Organ abnormality/lesion detection is limited in the absence of intravenous contrast. Within this limitation, unremarkable liver, biliary system, pancreas, spleen, adrenal glands, kidneys.  No hydroureteronephrosis.  No urinary tract calculi identified.  No overt colitis.  Normal appendix.  No bowel obstruction.  Tiny fat containing umbilical hernia.  No free intraperitoneal air.  No lymphadenopathy.  Thin-walled bladder. Nonspecific CT appearance to the uterus and left adnexa. Trace free pelvic fluid.  Normal caliber aorta and branch vessels.  L5 S1 mild disc height loss and associated endplate irregularity and sclerosis.  IMPRESSION: No hydronephrosis or urinary tract calculi.  L5 S1 disc height loss and endplate irregularity is favored to be degenerative.  Correlate clinically if concerned for superimposed infection.  Nonspecific appearance to the uterus and left adnexa may reflect underlying fibroid and physiologic left ovarian cyst.  If symptoms warrant, can be further evaluated with pelvic ultrasound.   Original Report Authenticated By: Jearld Lesch, M.D.  I personally reviewed the imaging tests through PACS system I reviewed available ER/hospitalization records through the EMR  Patient with ongoing point tenderness at the L5 region correlating to  the abnormality noted on CT scan.  This could represent arthritic and degenerative changes however given her age and the degree of her pain I think it is worthwhile to do an MRI scan to further evaluate for infectious process.  Patient will undergo MR with and without lumbar spine to evaluate further.  Care to oncoming physician.  Lyanne Co, MD 07/14/13 0600

## 2013-07-14 NOTE — ED Notes (Signed)
Unable to give urine specimen at this time at triage.  

## 2013-07-14 NOTE — ED Provider Notes (Signed)
Care assumed at the change of shift pending MRI to eval lumber spine which was neg for diskitis or osteomyelitis. Pain improved, plan discharge with PCP followup.   Dvora Buitron B. Bernette Mayers, MD 07/14/13 1021

## 2013-07-14 NOTE — ED Provider Notes (Signed)
CSN: [id number]     Arrival date & time 07/13/13  2359 History   First MD Initiated Contact with Patient 07/14/13 0136     Chief Complaint  Patient presents with  . Back Pain   HPI  History provided by the patient through a Spanish interpreter. Patient is a 37 year old female who presents with complaints of low back pain. Patient has had some low back pain intermittently for the past several months. Her pain became acutely worse however last Thursday, 3 days ago. Pain occasionally radiates up her spine. It is worse with certain movements and bending. She denies any specific injury or trauma. Denies any strenuous activity. She denies any associated fever, chills or sweats. No nausea or vomiting. No diarrhea or constipation. She denies any dysuria, hematuria urinary frequency. Denies any menstrual change. Last menstrual cycle October 20. No current vaginal bleeding or discharge. Denies any weakness or numbness in extremities. No urinary or fecal incontinence, urinary retention or perineal numbness.    Past Medical History  Diagnosis Date  . History of chlamydia 2008  . AMA (advanced maternal age) multigravida 35+   . Obese   . History of candidal vulvovaginitis    Past Surgical History  Procedure Laterality Date  . Cesarean section  05/21/2011    Procedure: CESAREAN SECTION;  Surgeon: Tilda Burrow, MD;  Location: [facility];  Service: Gynecology;  Laterality: N/A;  Primary cesarean section with delivery of baby girl at 68. Apgars 9/9.   Family History  Problem Relation Age of Onset  . Diabetes Mother     oral agents  . Hypertension Mother   . Anesthesia problems Neg Hx   . Hypotension Neg Hx   . Malignant hyperthermia Neg Hx   . Pseudochol deficiency Neg Hx    History  Substance Use Topics  . Smoking status: Never Smoker   . Smokeless tobacco: Not on file  . Alcohol Use: No   OB History   Grav Para Term Preterm Abortions TAB SAB Ect Mult Living   3 2 2  1 1    2      Review  of Systems  Constitutional: Negative for fever, chills, diaphoresis and fatigue.  Respiratory: Negative for shortness of breath.   Cardiovascular: Negative for chest pain.  Gastrointestinal: Negative for nausea, vomiting, abdominal pain, diarrhea and constipation.  Genitourinary: Negative for dysuria, frequency, hematuria, flank pain, vaginal bleeding, vaginal discharge and menstrual problem.  Musculoskeletal: Positive for back pain.  All other systems reviewed and are negative.    Allergies  Review of patient's allergies indicates no known allergies.  Home Medications   Current Outpatient Rx  Name  Route  Sig  Dispense  Refill  . acetaminophen (TYLENOL) 500 MG tablet   Oral   Take 1,000 mg by mouth every 6 (six) hours as needed. Pain or fever         . OXYCODONE HCL PO   Oral   Take 1 tablet by mouth as needed (pain).          BP 128/76  Pulse 86  Temp(Src) 98.6 F (37 C) (Oral)  Resp 18  Ht 5\' 4"  (1.626 m)  Wt 230 lb (104.327 kg)  BMI 39.46 kg/m2  SpO2 98%  LMP 07/06/2013 Physical Exam  Nursing note and vitals reviewed. Constitutional: She is oriented to person, place, and time. She appears well-developed and well-nourished. No distress.  HENT:  Head: Normocephalic and atraumatic.  Mouth/Throat: No oropharyngeal exudate.  Cardiovascular: Normal rate and  regular rhythm.   Pulmonary/Chest: Effort normal and breath sounds normal. No respiratory distress. She has no wheezes. She has no rales.  Abdominal: Soft. There is no tenderness. There is no rebound and no guarding.  Obese  Musculoskeletal: Normal range of motion. She exhibits no edema and no tenderness.       Lumbar back: She exhibits no bony tenderness, no swelling and no edema.       Back:  Neurological: She is alert and oriented to person, place, and time.  Skin: Skin is warm and dry. No rash noted.  Psychiatric: She has a normal mood and affect. Her behavior is normal.    ED Course  Procedures    DIAGNOSTIC STUDIES: Oxygen Saturation is 98% on room air.    COORDINATION OF CARE:  Nursing notes reviewed. Vital signs reviewed. Initial pt interview and examination performed.   2:15 AM patient seen and evaluated. She does not appear in any acute distress. Does appear uncomfortable especially with movements in the low back. Discussed work up plan with pt at bedside, which includes lab testing and CT scan. Pt agrees with plan.  UA demonstrates hematuria and some calcium oxalate crystals. Patient does not appear to have significant flank pain however will evaluate the possibilities of kidney stone.  CT scan without any signs of kidney stones. There is some degenerative changes at L5-S1. The patient does not appear toxic or have significant bony tenderness and I doubt overlying infection to this area at this time. Her pains have also been intermittent and chronic. Patient does have a hemoglobin of 8.7.  Hemoccult-negative.  Patient was also seen and evaluated by attending physician. Given her lower back pain near the L5-S1 area and findings on CT we do feel would be of benefit to follow with an MRI later this morning. Patient agrees with this plan. She is feeling improved after medicines earlier.   Treatment plan initiated: Medications  HYDROmorphone (DILAUDID) injection 1 mg (1 mg Intramuscular Given 07/14/13 0238)  ketorolac (TORADOL) 30 MG/ML injection 30 mg (30 mg Intramuscular Given 07/14/13 0238)     Results for orders placed during the hospital encounter of 07/14/13  URINALYSIS, ROUTINE W REFLEX MICROSCOPIC      Result Value Range   Color, Urine YELLOW  YELLOW   APPearance CLEAR  CLEAR   Specific Gravity, Urine 1.026  1.005 - 1.030   pH 6.0  5.0 - 8.0   Glucose, UA NEGATIVE  NEGATIVE mg/dL   Hgb urine dipstick MODERATE (*) NEGATIVE   Bilirubin Urine NEGATIVE  NEGATIVE   Ketones, ur NEGATIVE  NEGATIVE mg/dL   Protein, ur NEGATIVE  NEGATIVE mg/dL   Urobilinogen, UA 1.0  0.0  - 1.0 mg/dL   Nitrite NEGATIVE  NEGATIVE   Leukocytes, UA SMALL (*) NEGATIVE  URINE MICROSCOPIC-ADD ON      Result Value Range   Squamous Epithelial / LPF MANY (*) RARE   WBC, UA 3-6  <3 WBC/hpf   RBC / HPF 11-20  <3 RBC/hpf   Bacteria, UA RARE  RARE   Crystals CA OXALATE CRYSTALS (*) NEGATIVE  CBC WITH DIFFERENTIAL      Result Value Range   WBC 11.0 (*) 4.0 - 10.5 K/uL   RBC 4.60  3.87 - 5.11 MIL/uL   Hemoglobin 8.7 (*) 12.0 - 15.0 g/dL   HCT 16.1 (*) 09.6 - 04.5 %   MCV 62.6 (*) 78.0 - 100.0 fL   MCH 18.9 (*) 26.0 - 34.0 pg  MCHC 30.2  30.0 - 36.0 g/dL   RDW 16.1 (*) 09.6 - 04.5 %   Platelets 325  150 - 400 K/uL   Neutrophils Relative % 62  43 - 77 %   Lymphocytes Relative 28  12 - 46 %   Monocytes Relative 8  3 - 12 %   Eosinophils Relative 2  0 - 5 %   Basophils Relative 0  0 - 1 %   Neutro Abs 6.8  1.7 - 7.7 K/uL   Lymphs Abs 3.1  0.7 - 4.0 K/uL   Monocytes Absolute 0.9  0.1 - 1.0 K/uL   Eosinophils Absolute 0.2  0.0 - 0.7 K/uL   Basophils Absolute 0.0  0.0 - 0.1 K/uL   RBC Morphology POLYCHROMASIA PRESENT    BASIC METABOLIC PANEL      Result Value Range   Sodium 133 (*) 135 - 145 mEq/L   Potassium 3.5  3.5 - 5.1 mEq/L   Chloride 100  96 - 112 mEq/L   CO2 26  19 - 32 mEq/L   Glucose, Bld 98  70 - 99 mg/dL   BUN 16  6 - 23 mg/dL   Creatinine, Ser 4.09  0.50 - 1.10 mg/dL   Calcium 8.5  8.4 - 81.1 mg/dL   GFR calc non Af Amer 71 (*) >90 mL/min   GFR calc Af Amer 82 (*) >90 mL/min  POCT PREGNANCY, URINE      Result Value Range   Preg Test, Ur NEGATIVE  NEGATIVE      Imaging Review Ct Abdomen Pelvis Wo Contrast  07/14/2013   *RADIOLOGY REPORT*  Clinical data: Acute onset lobe back pain  CT ABDOMEN AND PELVIS WITHOUT CONTRAST  Technique:  Multidetector CT imaging of the abdomen and pelvis was performed following the standard protocol without intravenous contrast.  Comparison: None.  Findings: Lung bases are clear.  Normal heart size.  Low attenuation of the blood  pool suggests anemia.  Trace pericardial fluid.  Organ abnormality/lesion detection is limited in the absence of intravenous contrast. Within this limitation, unremarkable liver, biliary system, pancreas, spleen, adrenal glands, kidneys.  No hydroureteronephrosis.  No urinary tract calculi identified.  No overt colitis.  Normal appendix.  No bowel obstruction.  Tiny fat containing umbilical hernia.  No free intraperitoneal air.  No lymphadenopathy.  Thin-walled bladder. Nonspecific CT appearance to the uterus and left adnexa. Trace free pelvic fluid.  Normal caliber aorta and branch vessels.  L5 S1 mild disc height loss and associated endplate irregularity and sclerosis.  IMPRESSION: No hydronephrosis or urinary tract calculi.  L5 S1 disc height loss and endplate irregularity is favored to be degenerative.  Correlate clinically if concerned for superimposed infection.  Nonspecific appearance to the uterus and left adnexa may reflect underlying fibroid and physiologic left ovarian cyst.  If symptoms warrant, can be further evaluated with pelvic ultrasound.   Original Report Authenticated By: Jearld Lesch, M.D.      MDM  No diagnosis found.     Angus Seller, PA-C 07/14/13 [phone]

## 2013-07-14 NOTE — ED Notes (Signed)
Pt undressed pain med  Given for pain

## 2013-07-14 NOTE — ED Notes (Signed)
Patient transported to MRI 

## 2014-06-15 ENCOUNTER — Encounter (HOSPITAL_COMMUNITY): Payer: Self-pay | Admitting: Emergency Medicine

## 2014-09-13 ENCOUNTER — Emergency Department (HOSPITAL_COMMUNITY)
Admission: EM | Admit: 2014-09-13 | Discharge: 2014-09-14 | Disposition: A | Discharge status: Home / Self Care | Payer: Medicaid Other | Attending: Emergency Medicine | Admitting: Emergency Medicine

## 2014-09-13 ENCOUNTER — Encounter (HOSPITAL_COMMUNITY): Payer: Self-pay | Admitting: Emergency Medicine

## 2014-09-13 DIAGNOSIS — Z3202 Encounter for pregnancy test, result negative: Secondary | ICD-10-CM | POA: Insufficient documentation

## 2014-09-13 DIAGNOSIS — A419 Sepsis, unspecified organism: Secondary | ICD-10-CM | POA: Insufficient documentation

## 2014-09-13 DIAGNOSIS — E669 Obesity, unspecified: Secondary | ICD-10-CM | POA: Insufficient documentation

## 2014-09-13 DIAGNOSIS — E860 Dehydration: Secondary | ICD-10-CM | POA: Insufficient documentation

## 2014-09-13 DIAGNOSIS — D649 Anemia, unspecified: Secondary | ICD-10-CM | POA: Insufficient documentation

## 2014-09-13 DIAGNOSIS — N12 Tubulo-interstitial nephritis, not specified as acute or chronic: Secondary | ICD-10-CM | POA: Insufficient documentation

## 2014-09-13 LAB — URINALYSIS, ROUTINE W REFLEX MICROSCOPIC
BILIRUBIN URINE: NEGATIVE
Glucose, UA: NEGATIVE mg/dL
Ketones, ur: NEGATIVE mg/dL
Nitrite: NEGATIVE
Protein, ur: 30 mg/dL — AB
Specific Gravity, Urine: 1.013 (ref 1.005–1.030)
Urobilinogen, UA: 1 mg/dL (ref 0.0–1.0)
pH: 7.5 (ref 5.0–8.0)

## 2014-09-13 LAB — BASIC METABOLIC PANEL
Anion gap: 6 (ref 5–15)
BUN: 8 mg/dL (ref 6–23)
CALCIUM: 8.4 mg/dL (ref 8.4–10.5)
CHLORIDE: 100 mmol/L (ref 96–112)
CO2: 26 mmol/L (ref 19–32)
Creatinine, Ser: 1.01 mg/dL (ref 0.50–1.10)
GFR calc Af Amer: 80 mL/min — ABNORMAL LOW (ref >90)
GFR, EST NON AFRICAN AMERICAN: 69 mL/min — AB (ref >90)
Glucose, Bld: 149 mg/dL — ABNORMAL HIGH (ref 70–99)
Potassium: 3.6 mmol/L (ref 3.5–5.1)
Sodium: 132 mmol/L — ABNORMAL LOW (ref 135–145)

## 2014-09-13 LAB — URINE MICROSCOPIC-ADD ON

## 2014-09-13 LAB — CBC
HCT: 26.2 % — ABNORMAL LOW (ref 36.0–46.0)
HEMOGLOBIN: 7.4 g/dL — AB (ref 12.0–15.0)
MCH: 16.1 pg — AB (ref 26.0–34.0)
MCHC: 28.2 g/dL — ABNORMAL LOW (ref 30.0–36.0)
MCV: 57 fL — AB (ref 78.0–100.0)
PLATELETS: 292 10*3/uL (ref 150–400)
RBC: 4.6 MIL/uL (ref 3.87–5.11)
RDW: 19.2 % — ABNORMAL HIGH (ref 11.5–15.5)
WBC: 6.7 10*3/uL (ref 4.0–10.5)

## 2014-09-13 LAB — I-STAT TROPONIN, ED: Troponin i, poc: 0 ng/mL (ref 0.00–0.08)

## 2014-09-13 LAB — POC URINE PREG, ED: Preg Test, Ur: NEGATIVE

## 2014-09-13 LAB — I-STAT CG4 LACTIC ACID, ED: LACTIC ACID, VENOUS: 1.31 mmol/L (ref 0.5–2.0)

## 2014-09-13 MED ORDER — SODIUM CHLORIDE 0.9 % IV BOLUS (SEPSIS)
1000.0000 mL | Freq: Once | INTRAVENOUS | Status: AC
  Administered 2014-09-13: 1000 mL via INTRAVENOUS

## 2014-09-13 MED ORDER — SODIUM CHLORIDE 0.9 % IV BOLUS (SEPSIS)
1000.0000 mL | Freq: Once | INTRAVENOUS | Status: AC
  Administered 2014-09-14: 1000 mL via INTRAVENOUS

## 2014-09-13 MED ORDER — DEXTROSE 5 % IV SOLN
1.0000 g | Freq: Once | INTRAVENOUS | Status: AC
  Administered 2014-09-13: 1 g via INTRAVENOUS

## 2014-09-13 MED ORDER — ACETAMINOPHEN 325 MG PO TABS
650.0000 mg | ORAL_TABLET | Freq: Once | ORAL | Status: AC
  Administered 2014-09-13: 650 mg via ORAL
  Filled 2014-09-13: qty 2

## 2014-09-13 NOTE — ED Notes (Addendum)
C/o bilateral flank pain, nausea, increased thirst, and chills over the past 2 weeks.  Reports 1 episode of L sided chest pain earlier today that resolved.  Denies urinary complaints.

## 2014-09-13 NOTE — ED Provider Notes (Signed)
CSN: [id number]     Arrival date & time 09/13/14  2138 History   First MD Initiated Contact with Patient 09/13/14 2158     Chief Complaint  Patient presents with  . Flank Pain    Patient is a 39 y.o. female presenting with flank pain. The history is provided by the patient. A language interpreter was used ([id number]).  Flank Pain This is a new problem. The current episode started 12 to 24 hours ago. The problem occurs constantly. The problem has been gradually worsening. Associated symptoms include abdominal pain and headaches. Nothing aggravates the symptoms. Nothing relieves the symptoms.  Patient reports for past 24 hours she has had left flank pain as well as fever/chills She also reports mild cough and HA No vomiting/diarrhea She also reports her heart is racing She reports recent dysuria then the flank pain began   No recent travel is reported  Past Medical History  Diagnosis Date  . History of chlamydia 2008  . AMA (advanced maternal age) multigravida 80+   . Obese   . History of candidal vulvovaginitis    Past Surgical History  Procedure Laterality Date  . Cesarean section  05/21/2011    Procedure: CESAREAN SECTION;  Surgeon: Jonnie Kind, MD;  Location: [facility];  Service: Gynecology;  Laterality: N/A;  Primary cesarean section with delivery of baby girl at 48. Apgars 9/9.   Family History  Problem Relation Age of Onset  . Diabetes Mother     oral agents  . Hypertension Mother   . Anesthesia problems Neg Hx   . Hypotension Neg Hx   . Malignant hyperthermia Neg Hx   . Pseudochol deficiency Neg Hx    History  Substance Use Topics  . Smoking status: Never Smoker   . Smokeless tobacco: Not on file  . Alcohol Use: No   OB History    Gravida Para Term Preterm AB TAB SAB Ectopic Multiple Living   3 2 2  1 1    2      Review of Systems  Constitutional: Positive for fever, chills and fatigue.  Gastrointestinal: Positive for abdominal pain.  Genitourinary: Positive  for flank pain.  Skin: Negative for rash.  Neurological: Positive for headaches.  All other systems reviewed and are negative.     Allergies  Review of patient's allergies indicates no known allergies.  Home Medications   Prior to Admission medications   Medication Sig Start Date End Date Taking? Authorizing Provider  acetaminophen (TYLENOL) 500 MG tablet Take 500 mg by mouth every 6 (six) hours as needed for mild pain.   Yes Historical Provider, MD  oxyCODONE-acetaminophen (PERCOCET/ROXICET) 5-325 MG per tablet Take 1-2 tablets by mouth every 6 (six) hours as needed for severe pain. Patient not taking: Reported on 09/13/2014 07/14/13   Charles B. Karle Starch, MD   BP 127/76 mmHg  Pulse 123  Temp(Src) 102.9 F (39.4 C) (Oral)  Resp 26  SpO2 100%  LMP 07/21/2014 Physical Exam CONSTITUTIONAL: Well developed/well nourished HEAD: Normocephalic/atraumatic EYES: EOMI/PERRL ENMT: Mucous membranes moist, uvula midline, no erythema/exudates NECK: supple no meningeal signs SPINE/BACK:entire spine nontender CV: S1/S2 noted, no murmurs/rubs/gallops noted LUNGS: Lungs are clear to auscultation bilaterally, no apparent distress ABDOMEN: soft, nontender, no rebound or guarding, bowel sounds noted throughout abdomen LO:VFIE cva tenderness NEURO: Pt is awake/alert/appropriate, moves all extremitiesx4.  No facial droop.   EXTREMITIES: pulses normal/equal, full ROM SKIN: warm, color normal PSYCH: no abnormalities of mood noted, alert and oriented to situation  ED Course  Procedures  CRITICAL CARE Performed by: Sharyon Cable Total critical care time: 31 Critical care time was exclusive of separately billable procedures and treating other patients. Critical care was necessary to treat or prevent imminent or life-threatening deterioration. Critical care was time spent personally by me on the following activities: development of treatment plan with patient and/or surrogate as well as nursing,  discussions with consultants, evaluation of patient's response to treatment, examination of patient, obtaining history from patient or surrogate, ordering and performing treatments and interventions, ordering and review of laboratory studies, ordering and review of radiographic studies, pulse oximetry and re-evaluation of patient's condition. PATIENT WITH FEVER/TACHYCARDIA AND PYELONEPHRITIS SHE REQUIRED 2 LITERS OF NORMAL SALINE.  SHE WAS TACHYCARDIC >130 SHE IMPROVED WITH IV FLUIDS AND ANTIBIOTICS  11:33 PM Pt with fever/left flank pain and evidence of UTI, likely pyelonephritis Rocephin ordered 1:10 AM PT IMPROVED URINE CULTURE SENT HEART RATE IMPROVED NO HYPOTENSION SHE IS ANEMIC BUT THIS HAS BEEN NOTED PREVIOUSLY SHE REPORTS HEAVY MENSTRUAL CYCLE BUT NO RECTAL BLEEDING IS REPORTED SHE CAN F/U FOR THIS AS OUTPATIENT I UTILIZED INTERPRETER TO DISCUSS NEED FOR HER ANTIBIOTICS RETURN PRECAUTIONS DISCUSSED BP 115/65 mmHg  Pulse 119  Temp(Src) 100.1 F (37.8 C) (Oral)  Resp 39  SpO2 98%  LMP 07/21/2014   Labs Review Labs Reviewed  CBC - Abnormal; Notable for the following:    Hemoglobin 7.4 (*)    HCT 26.2 (*)    MCV 57.0 (*)    MCH 16.1 (*)    MCHC 28.2 (*)    RDW 19.2 (*)    All other components within normal limits  BASIC METABOLIC PANEL - Abnormal; Notable for the following:    Sodium 132 (*)    Glucose, Bld 149 (*)    GFR calc non Af Amer 69 (*)    GFR calc Af Amer 80 (*)    All other components within normal limits  URINALYSIS, ROUTINE W REFLEX MICROSCOPIC - Abnormal; Notable for the following:    APPearance CLOUDY (*)    Hgb urine dipstick SMALL (*)    Protein, ur 30 (*)    Leukocytes, UA LARGE (*)    All other components within normal limits  URINE MICROSCOPIC-ADD ON - Abnormal; Notable for the following:    Bacteria, UA FEW (*)    All other components within normal limits  URINE CULTURE  I-STAT TROPOININ, ED  POC URINE PREG, ED  I-STAT CG4 LACTIC ACID,  ED      EKG Interpretation   Date/Time:  Sunday September 13 2014 21:46:02 EST Ventricular Rate:  133 PR Interval:  124 QRS Duration: 70 QT Interval:  286 QTC Calculation: 425 R Axis:   64 Text Interpretation:  Sinus tachycardia Otherwise normal ECG No previous  ECGs available Confirmed by Christy Gentles  MD, Thu Baggett ([id number]) on 09/13/2014  10:08:29 PM     Medications  sodium chloride 0.9 % bolus 1,000 mL (0 mLs Intravenous Stopped 09/14/14 0041)  acetaminophen (TYLENOL) tablet 650 mg (650 mg Oral Given 09/13/14 2236)  sodium chloride 0.9 % bolus 1,000 mL (1,000 mLs Intravenous New Bag/Given 09/14/14 0043)  cefTRIAXone (ROCEPHIN) 1 g in dextrose 5 % 50 mL IVPB (0 g Intravenous Stopped 09/14/14 0010)  ibuprofen (ADVIL,MOTRIN) tablet 800 mg (800 mg Oral Given 09/14/14 0042)    MDM   Final diagnoses:  Pyelonephritis  Dehydration  Sepsis, due to unspecified organism  Anemia, unspecified anemia type    Nursing notes including past medical history and  social history reviewed and considered in documentation Labs/vital reviewed myself and considered during evaluation     Sharyon Cable, MD 09/14/14 [phone]

## 2014-09-14 MED ORDER — IBUPROFEN 400 MG PO TABS: 600.0000 mg | ORAL_TABLET | Freq: Once | ORAL | Status: DC

## 2014-09-14 MED ORDER — OXYCODONE-ACETAMINOPHEN 5-325 MG PO TABS: 1.0000 | ORAL_TABLET | ORAL | Status: DC | PRN

## 2014-09-14 MED ORDER — CEPHALEXIN 500 MG PO CAPS: 500.0000 mg | ORAL_CAPSULE | Freq: Four times a day (QID) | ORAL | Status: DC

## 2014-09-14 MED ORDER — IBUPROFEN 800 MG PO TABS
800.0000 mg | ORAL_TABLET | Freq: Once | ORAL | Status: AC
  Administered 2014-09-14: 800 mg via ORAL
  Filled 2014-09-14: qty 1

## 2014-09-16 LAB — URINE CULTURE: Colony Count: >=100000

## 2014-09-17 ENCOUNTER — Telehealth: Payer: Self-pay | Admitting: *Deleted

## 2014-09-17 NOTE — ED Notes (Unsigned)
Post ED Visit - Positive Culture Follow-up  Culture report reviewed by antimicrobial stewardship pharmacist: []  Wes Dulaney, Pharm.D., BCPS []  Heide Guile, Pharm.D., BCPS [x]  Alycia Rossetti, Pharm.D., BCPS []  Welcome, Pharm.D., BCPS, AAHIVP []  Legrand Como, Pharm.D., BCPS, AAHIVP []  Isac Sarna, Pharm.D., BCPS  Positive *** culture Treated with ***, organism sensitive to the same and no further patient follow-up is required at this time.  Harlon Flor [facility] 09/17/2014, 2:35 AM

## 2014-09-18 ENCOUNTER — Telehealth: Payer: Self-pay | Admitting: *Deleted

## 2014-09-18 NOTE — ED Notes (Signed)
(+)  urine culture, no further treatment needed, J. Judie Bonus, Pharm

## 2015-01-06 ENCOUNTER — Other Ambulatory Visit: Payer: Self-pay | Admitting: Obstetrics & Gynecology

## 2015-01-06 ENCOUNTER — Inpatient Hospital Stay (HOSPITAL_COMMUNITY)
Admission: AD | Admit: 2015-01-06 | Discharge: 2015-01-06 | Disposition: A | Discharge status: Home / Self Care | Payer: Self-pay | Source: Ambulatory Visit | Attending: Obstetrics & Gynecology | Admitting: Obstetrics & Gynecology

## 2015-01-06 ENCOUNTER — Encounter (HOSPITAL_COMMUNITY): Payer: Self-pay

## 2015-01-06 ENCOUNTER — Inpatient Hospital Stay (HOSPITAL_COMMUNITY): Payer: Medicaid Other

## 2015-01-06 DIAGNOSIS — O99011 Anemia complicating pregnancy, first trimester: Secondary | ICD-10-CM | POA: Insufficient documentation

## 2015-01-06 DIAGNOSIS — O26899 Other specified pregnancy related conditions, unspecified trimester: Secondary | ICD-10-CM

## 2015-01-06 DIAGNOSIS — O2301 Infections of kidney in pregnancy, first trimester: Secondary | ICD-10-CM | POA: Insufficient documentation

## 2015-01-06 DIAGNOSIS — O9989 Other specified diseases and conditions complicating pregnancy, childbirth and the puerperium: Secondary | ICD-10-CM

## 2015-01-06 DIAGNOSIS — N12 Tubulo-interstitial nephritis, not specified as acute or chronic: Secondary | ICD-10-CM

## 2015-01-06 DIAGNOSIS — G8929 Other chronic pain: Secondary | ICD-10-CM | POA: Insufficient documentation

## 2015-01-06 DIAGNOSIS — Z3A01 Less than 8 weeks gestation of pregnancy: Secondary | ICD-10-CM | POA: Insufficient documentation

## 2015-01-06 DIAGNOSIS — M549 Dorsalgia, unspecified: Secondary | ICD-10-CM | POA: Insufficient documentation

## 2015-01-06 DIAGNOSIS — R109 Unspecified abdominal pain: Secondary | ICD-10-CM

## 2015-01-06 LAB — URINE MICROSCOPIC-ADD ON

## 2015-01-06 LAB — CBC
HEMATOCRIT: 25.4 % — AB (ref 36.0–46.0)
Hemoglobin: 7 g/dL — ABNORMAL LOW (ref 12.0–15.0)
MCH: 15.1 pg — ABNORMAL LOW (ref 26.0–34.0)
MCHC: 27.6 g/dL — ABNORMAL LOW (ref 30.0–36.0)
MCV: 54.9 fL — ABNORMAL LOW (ref 78.0–100.0)
Platelets: 385 10*3/uL (ref 150–400)
RBC: 4.63 MIL/uL (ref 3.87–5.11)
RDW: 19.9 % — AB (ref 11.5–15.5)
WBC: 9.2 10*3/uL (ref 4.0–10.5)

## 2015-01-06 LAB — URINALYSIS, ROUTINE W REFLEX MICROSCOPIC
Bilirubin Urine: NEGATIVE
GLUCOSE, UA: NEGATIVE mg/dL
Hgb urine dipstick: NEGATIVE
KETONES UR: NEGATIVE mg/dL
Nitrite: NEGATIVE
PROTEIN: NEGATIVE mg/dL
SPECIFIC GRAVITY, URINE: 1.01 (ref 1.005–1.030)
UROBILINOGEN UA: 0.2 mg/dL (ref 0.0–1.0)
pH: 6 (ref 5.0–8.0)

## 2015-01-06 LAB — POCT PREGNANCY, URINE: Preg Test, Ur: POSITIVE — AB

## 2015-01-06 LAB — HCG, QUANTITATIVE, PREGNANCY: hCG, Beta Chain, Quant, S: 8639 m[IU]/mL — ABNORMAL HIGH (ref <5)

## 2015-01-06 MED ORDER — CEFTRIAXONE SODIUM 1 G IJ SOLR
1.0000 g | Freq: Once | INTRAMUSCULAR | Status: AC
  Administered 2015-01-06: 1 g via INTRAMUSCULAR
  Filled 2015-01-06: qty 10

## 2015-01-06 MED ORDER — CEPHALEXIN 500 MG PO CAPS: 500.0000 mg | ORAL_CAPSULE | Freq: Two times a day (BID) | ORAL | Status: DC

## 2015-01-06 MED ORDER — CYCLOBENZAPRINE HCL 10 MG PO TABS
10.0000 mg | ORAL_TABLET | Freq: Once | ORAL | Status: AC
  Administered 2015-01-06: 10 mg via ORAL
  Filled 2015-01-06: qty 1

## 2015-01-06 MED ORDER — CEPHALEXIN 500 MG PO CAPS: 500.0000 mg | ORAL_CAPSULE | Freq: Four times a day (QID) | ORAL | Status: DC

## 2015-01-06 NOTE — MAU Provider Note (Signed)
History     CSN: [id number]  Arrival date and time: 01/06/15 1050   First Provider Initiated Contact with Patient 01/06/15 1312      Chief Complaint  Patient presents with  . Back Pain   HPI   [name] is a 39 y.o. female [phone] at [redacted]w[redacted]d who presents with back pain. The pain pain started one week ago; worse on the left side today. The patient does have a history of chronic back pain.  Yesterday the back pain was located on both sides of her lower back, today the pain is worse on the left side and radiates to her bottom. The patient is pointing to the left upper part of her back.  She currently rates her pain 6/10.   She has a history of chronic back pain which started 4 years ago after a fall.   OB History    Gravida Para Term Preterm AB TAB SAB Ectopic Multiple Living   4 2 2  1 1    2       Past Medical History  Diagnosis Date  . History of chlamydia 2008  . AMA (advanced maternal age) multigravida 67+   . Obese   . History of candidal vulvovaginitis     Past Surgical History  Procedure Laterality Date  . Cesarean section  05/21/2011    Procedure: CESAREAN SECTION;  Surgeon: Jonnie Kind, MD;  Location: [facility];  Service: Gynecology;  Laterality: N/A;  Primary cesarean section with delivery of baby girl at 40. Apgars 9/9.    Family History  Problem Relation Age of Onset  . Diabetes Mother     oral agents  . Hypertension Mother   . Anesthesia problems Neg Hx   . Hypotension Neg Hx   . Malignant hyperthermia Neg Hx   . Pseudochol deficiency Neg Hx     History  Substance Use Topics  . Smoking status: Never Smoker   . Smokeless tobacco: Not on file  . Alcohol Use: No    Allergies: No Known Allergies  Prescriptions prior to admission  Medication Sig Dispense Refill Last Dose  . acetaminophen (TYLENOL) 500 MG tablet Take 500 mg by mouth every 6 (six) hours as needed for mild pain.   09/13/2014 at Unknown time  . cephALEXin (KEFLEX) 500 MG  capsule Take 1 capsule (500 mg total) by mouth 4 (four) times daily. 40 capsule 0   . oxyCODONE-acetaminophen (PERCOCET/ROXICET) 5-325 MG per tablet Take 1 tablet by mouth every 4 (four) hours as needed for severe pain. 10 tablet 0    Results for orders placed or performed during the hospital encounter of 01/06/15 (from the past 48 hour(s))  Urinalysis, Routine w reflex microscopic     Status: Abnormal   Collection Time: 01/06/15 11:08 AM  Result Value Ref Range   Color, Urine YELLOW YELLOW   APPearance CLOUDY (A) CLEAR   Specific Gravity, Urine 1.010 1.005 - 1.030   pH 6.0 5.0 - 8.0   Glucose, UA NEGATIVE NEGATIVE mg/dL   Hgb urine dipstick NEGATIVE NEGATIVE   Bilirubin Urine NEGATIVE NEGATIVE   Ketones, ur NEGATIVE NEGATIVE mg/dL   Protein, ur NEGATIVE NEGATIVE mg/dL   Urobilinogen, UA 0.2 0.0 - 1.0 mg/dL   Nitrite NEGATIVE NEGATIVE   Leukocytes, UA LARGE (A) NEGATIVE  Urine microscopic-add on     Status: Abnormal   Collection Time: 01/06/15 11:08 AM  Result Value Ref Range   Squamous Epithelial / LPF FEW (A) RARE  WBC, UA 21-50 <3 WBC/hpf   Bacteria, UA RARE RARE  Pregnancy, urine POC     Status: Abnormal   Collection Time: 01/06/15 11:22 AM  Result Value Ref Range   Preg Test, Ur POSITIVE (A) NEGATIVE    Comment:        THE SENSITIVITY OF THIS METHODOLOGY IS >24 mIU/mL   CBC     Status: Abnormal   Collection Time: 01/06/15  1:34 PM  Result Value Ref Range   WBC 9.2 4.0 - 10.5 K/uL   RBC 4.63 3.87 - 5.11 MIL/uL   Hemoglobin 7.0 (L) 12.0 - 15.0 g/dL   HCT 25.4 (L) 36.0 - 46.0 %   MCV 54.9 (L) 78.0 - 100.0 fL   MCH 15.1 (L) 26.0 - 34.0 pg   MCHC 27.6 (L) 30.0 - 36.0 g/dL   RDW 19.9 (H) 11.5 - 15.5 %   Platelets 385 150 - 400 K/uL  hCG, quantitative, pregnancy     Status: Abnormal   Collection Time: 01/06/15  1:34 PM  Result Value Ref Range   hCG, Beta Chain, Quant, S 8639 (H) <5 mIU/mL    Comment:          GEST. AGE      CONC.  (mIU/mL)   <=1 WEEK        5 - 50      2 WEEKS       50 - 500     3 WEEKS       100 - 10,000     4 WEEKS     1,000 - 30,000     5 WEEKS     3,500 - 115,000   6-8 WEEKS     12,000 - 270,000    12 WEEKS     15,000 - 220,000        FEMALE AND NON-PREGNANT FEMALE:     LESS THAN 5 mIU/mL    US Ob Comp Less 14 Wks  01/06/2015   CLINICAL DATA:  Positive pregnancy test with 4 year history of intermittent back pain.  EXAM: OBSTETRIC <14 WK [place] AND TRANSVAGINAL OB US  TECHNIQUE: Both transabdominal and transvaginal ultrasound examinations were performed for complete evaluation of the gestation as well as the maternal uterus, adnexal regions, and pelvic cul-de-sac. Transvaginal technique was performed to assess early pregnancy.  COMPARISON:  None.  FINDINGS: Intrauterine gestational sac: Visualized/normal in shape.  Yolk sac:  Visualized.  Embryo:  Not visualized.  MSD: 10.4  mm   5 w   5  d  Maternal uterus/adnexae: Maternal ovaries are sonographically normal. No evidence for adnexal mass. Small volume intraperitoneal free fluid identified.  IMPRESSION: Single intrauterine gestational sac with yolk sac visible but no embryo yet apparent. Repeat pelvic ultrasound in 7-10 days could be used to assess for appropriate pregnancy progression.   Electronically Signed   By: Misty Stanley M.D.   On: 01/06/2015 15:05   US Ob Transvaginal  01/06/2015   CLINICAL DATA:  Positive pregnancy test with 4 year history of intermittent back pain.  EXAM: OBSTETRIC <14 WK [place] AND TRANSVAGINAL OB US  TECHNIQUE: Both transabdominal and transvaginal ultrasound examinations were performed for complete evaluation of the gestation as well as the maternal uterus, adnexal regions, and pelvic cul-de-sac. Transvaginal technique was performed to assess early pregnancy.  COMPARISON:  None.  FINDINGS: Intrauterine gestational sac: Visualized/normal in shape.  Yolk sac:  Visualized.  Embryo:  Not visualized.  MSD: 10.4  mm  5 w   5  d  Maternal uterus/adnexae: Maternal ovaries are  sonographically normal. No evidence for adnexal mass. Small volume intraperitoneal free fluid identified.  IMPRESSION: Single intrauterine gestational sac with yolk sac visible but no embryo yet apparent. Repeat pelvic ultrasound in 7-10 days could be used to assess for appropriate pregnancy progression.   Electronically Signed   By: Misty Stanley M.D.   On: 01/06/2015 15:05      Review of Systems  Constitutional: Negative for fever and chills.  Genitourinary: Positive for urgency and frequency. Negative for dysuria and hematuria.   Physical Exam   Blood pressure 124/65, pulse 90, temperature 98 F (36.7 C), temperature source Oral, resp. rate 18, last menstrual period 12/09/2014.  Physical Exam  Constitutional: She is oriented to person, place, and time. Vital signs are normal. She appears well-developed and well-nourished.  Non-toxic appearance. She does not have a sickly appearance. She does not appear ill. No distress.  HENT:  Head: Normocephalic.  Eyes: Pupils are equal, round, and reactive to light.  Respiratory: Effort normal.  GI: Soft. She exhibits no distension. There is tenderness. There is CVA tenderness (Left ). There is no rebound.  Genitourinary:  Wet prep and GC collected by RN  Musculoskeletal: Normal range of motion.  Neurological: She is alert and oriented to person, place, and time.  Skin: Skin is warm. She is not diaphoretic.  Psychiatric: Her behavior is normal.    MAU Course  Procedures  None  MDM Flexeril given in MAU with minimal relief  Rocephin 1 gram given IM  Discharge instructions done using spanish interpretor   Urine culture pending   US shows [facility] @[redacted]w[redacted]d   Assessment and Plan   A:  1. Pyelonephritis affecting pregnancy in first trimester   2. Abdominal pain in pregnancy, antepartum   3. Chronic back pain   4. Anemia affecting pregnancy in first trimester     P:  Discharge home in stable condition RX: Keflex times 14 days   Discussed the importance of Iron. Patient has a history of anemia; I reviewed past CBC results. Patient to take iron as directed on the bottle  Ok to take tylenol as directed on the bottle  The patient will be contacted in 48 hours; if she is not feeling significantly better she may need reevaluation/ admission for IV antibiotics.    Lezlie Lye, NP 01/06/2015 1:16 PM

## 2015-01-06 NOTE — MAU Note (Signed)
Pt presents complaining of back pain on both sides of her flank and lower back. +HPT last week. LMP 4/27. Denies pain or burning with urination. Denies vaginal bleeding.

## 2015-01-06 NOTE — Progress Notes (Signed)
Pt needs to be on qid keftex in pregnancy for pyelo treatment.  RN or K Rassette to call patient and explain treatment plan

## 2015-01-06 NOTE — Discharge Instructions (Signed)
Dolor abdominal en el embarazo (Abdominal Pain During Pregnancy) El dolor abdominal es frecuente durante el embarazo. Generalmente no causa ningn dao. El dolor abdominal puede tener numerosas causas. Algunas causas son ms graves que otras. Ciertas causas de dolor abdominal durante el embarazo se diagnostican fcilmente. A veces, se tarda un tiempo para llegar al diagnstico. Otras veces la causa no se conoce. El dolor abdominal puede estar relacionado con [place] alteracin del [place], o puede deberse a una causa totalmente diferente. Por este motivo, siempre consulte a su mdico cuando sienta molestias abdominales. INSTRUCCIONES PARA EL CUIDADO EN EL HOGAR  Est atenta al dolor para ver si hay cambios. Las siguientes indicaciones ayudarn a [employer] pueda sentir:  No Consulting civil engineer sexuales y no coloque nada dentro de la vagina hasta que los sntomas hayan desaparecido completamente.  Descanse todo lo que pueda [employer] dolor se le haya calmado.  Si siente nuseas, beba lquidos claros. Evite los alimentos slidos mientras sienta malestar o tenga nuseas.  Tome slo medicamentos de venta libre o recetados, segn las indicaciones del mdico.  [place]um[place] con todas las visitas de control, segn le indique su mdico. SOLICITE ATENCIN MDICA DE INMEDIATO SI:  Tiene un sangrado, prdida de lquidos o elimina tejidos por la vagina.  El dolor o los clicos [place].  Tiene vmitos persistentes.  Comienza a Education officer, environmental al orinar u [employer].  Tiene fiebre.  Nota que los movimientos del beb disminuyen.  Siente intensa debilidad o se marea.  Tiene dificultad para respirar con o sin dolor abdominal.  Siente un dolor de cabeza intenso junto al dolor abdominal.  Wilma Flavin secrecin vaginal anormal con dolor abdominal.  Tiene diarrea persistente.  El dolor abdominal sigue o empeora an despus de Magazine features editor. ASEGRESE DE QUE:   Comprende estas  instrucciones.  Controlar su afeccin.  Recibir ayuda de inmediato si no mejora o si empeora. Document Released: 07/31/2005 Document Revised: 05/21/2013 [facility] Patient Information 2015 [place], [place]. This information is not intended to replace advice given to you by your health care provider. Make sure you discuss any questions you have with your health care provider.

## 2015-01-07 LAB — CULTURE, OB URINE
Colony Count: 10000
Special Requests: NORMAL

## 2015-01-07 NOTE — Progress Notes (Signed)
Attempted to contact patient with [place] interpreter [phone] to inform patient of Keflex RX for pylo. Mobile number not in service. Called home phone and left message stating "a prescription has been sent to your pharmacy, please pick this medication up and take it as prescribed and in its entirety, please call clinic so that we may give you more information regarding RX."

## 2015-01-07 NOTE — Progress Notes (Signed)
Called patient with pacific interpreter [phone], no answer- left message stating we are trying to reach you about a medication, please call us back at the clinics

## 2015-01-08 MED ORDER — CEPHALEXIN 500 MG PO CAPS: 500.0000 mg | ORAL_CAPSULE | Freq: Four times a day (QID) | ORAL | Status: DC

## 2015-01-08 NOTE — Addendum Note (Signed)
Addended by: Geanie Logan on: 01/08/2015 11:45 AM   Modules accepted: Orders

## 2015-01-08 NOTE — Progress Notes (Signed)
Patient called clinic and left unclear message. Called patient with spanish interpreter MariaElena. Patient states she was confused because she had picked up a RX at the pharmacy and then received a call stating there was another RX to pick up-- states she picked up Keflex 500mg  tabs and is taking 1 tab BID, reports she has been given 28 tablets. Informed patient RX was to be one tablet 4 times a day. Informed patient I would call her pharmacy and call her back. Called the pharmacy and confirmed patient had picked up RX, therefore has (28) 500mg  tabs. This RX will cost patient $42. Confirmed with Dr. Harolyn Rutherford that patient would need to continue medication, otherwise would decrease quantity to decrease cost. Dr. Harolyn Rutherford states patient will need to take 500mg  qid X 2 weeks and then 500mg  daily for suppression. Called patient and informed her to pick up newest RX, but advised that she can continue to take the Keflex pills that she did receive as they are the same dose as the new pills. Advised she will increase to taking one tab 4 times per day instead of the initial 2 times per day. Informed her she will need to do this for two weeks and then take one tablet daily for suppression. Informed patient that she will need refills but that she will obtain those at [facility] visit.

## 2015-01-08 NOTE — Progress Notes (Signed)
Patient requested RX be sent to [facility] as she knows it will be cheaper than the $42 at [facility]. Medication e-prescribed per patient request to [facility] on [address]. Advised patient call health department to schedule initial OB visit and advised she come to MAU if any problems. Patient verbalized understanding and gratitude. No further questions or concerns.

## 2015-01-09 ENCOUNTER — Telehealth: Payer: Self-pay | Admitting: Obstetrics and Gynecology

## 2015-01-09 NOTE — Telephone Encounter (Signed)
Patient is currently taking BID keflex and needs QID dosing. Interpretor used; message left to have patient return call to MAU.

## 2015-01-12 ENCOUNTER — Telehealth: Payer: Self-pay

## 2015-01-12 NOTE — Telephone Encounter (Signed)
Patient called in [place]-- heard she had a question about pills. Per chart review, I had discussed patient's change from BID to QID Keflex with patient on 01/08/15 in depth. On 01/09/15 Noni Saupe, NP called patient to inform her of the need to change from BID to QID. Patient likely calling in regards to that phone call. Called patient with pacific interpreter [phone]. Patient states she has been having diarrhea since she started ABX. Informed patient that diarrhea is a side effect. Advised she drink plenty of fluids, eat foods high in fiber, and eat a yogurt a day to increase good bacteria/flora of stomach. Patient then asked to clarify how she should be taking medication-- states she is taking it 4 times per day now but when should she stop-- informed patient she should take 4 times a day for 2 weeks-- once she has done it for two weeks she should take 1 tablet daily. Patient verbalized understanding and gratitude. No further questions or concerns.

## 2015-02-07 ENCOUNTER — Inpatient Hospital Stay (HOSPITAL_COMMUNITY): Payer: Medicaid Other

## 2015-02-07 ENCOUNTER — Encounter (HOSPITAL_COMMUNITY): Payer: Self-pay

## 2015-02-07 ENCOUNTER — Inpatient Hospital Stay (HOSPITAL_COMMUNITY)
Admission: AD | Admit: 2015-02-07 | Discharge: 2015-02-07 | Disposition: A | Discharge status: Home / Self Care | Payer: Self-pay | Source: Ambulatory Visit | Attending: Obstetrics & Gynecology | Admitting: Obstetrics & Gynecology

## 2015-02-07 DIAGNOSIS — Z3A10 10 weeks gestation of pregnancy: Secondary | ICD-10-CM | POA: Insufficient documentation

## 2015-02-07 DIAGNOSIS — O209 Hemorrhage in early pregnancy, unspecified: Secondary | ICD-10-CM | POA: Insufficient documentation

## 2015-02-07 DIAGNOSIS — R109 Unspecified abdominal pain: Secondary | ICD-10-CM | POA: Insufficient documentation

## 2015-02-07 LAB — URINALYSIS, ROUTINE W REFLEX MICROSCOPIC
BILIRUBIN URINE: NEGATIVE
GLUCOSE, UA: NEGATIVE mg/dL
KETONES UR: NEGATIVE mg/dL
LEUKOCYTES UA: NEGATIVE
Nitrite: NEGATIVE
PROTEIN: NEGATIVE mg/dL
Specific Gravity, Urine: 1.02 (ref 1.005–1.030)
Urobilinogen, UA: 0.2 mg/dL (ref 0.0–1.0)
pH: 5.5 (ref 5.0–8.0)

## 2015-02-07 LAB — URINE MICROSCOPIC-ADD ON

## 2015-02-07 LAB — WET PREP, GENITAL
Clue Cells Wet Prep HPF POC: NONE SEEN
Trich, Wet Prep: NONE SEEN
YEAST WET PREP: NONE SEEN

## 2015-02-07 NOTE — Discharge Instructions (Signed)
Hemorragia vaginal durante el embarazo (primer trimestre) (Vaginal Bleeding During Pregnancy, First Trimester) Durante los primeros meses del embarazo es relativamente frecuente que se presente una pequea hemorragia (manchas). Esta situacin generalmente mejora por s misma. Estas hemorragias o manchas tienen diversas causas al inicio del embarazo. Algunas manchas pueden estar relacionadas al [employer] y otras no. En la [employer], la hemorragia es normal y no es un problema. Sin embargo, la hemorragia tambin puede ser un signo de algo grave. Debe informar a su mdico de inmediato si tiene alguna hemorragia vaginal. Algunas causas posibles de hemorragia vaginal durante el primer trimestre incluyen:  Infeccin o inflamacin del cuello del tero.  Crecimientos anormales (plipos) en el cuello del tero.  Aborto espontneo o amenaza de aborto espontneo.  Tejido del Media planner se ha desarrollado fuera del tero y en una trompa de falopio (embarazo ectpico).  Se han desarrollado pequeos quistes en el tero en lugar de tejido de embarazo (embarazo molar). INSTRUCCIONES PARA EL CUIDADO EN EL HOGAR  Controle su afeccin para ver si hay cambios. Las siguientes indicaciones ayudarn a [employer] pueda sentir:  Siga las indicaciones del mdico para restringir su actividad. Si el mdico le indica descanso en la cama, debe quedarse en la cama y levantarse solo para ir al bao. No obstante, el mdico puede permitirle que continu con tareas livianas.  Si es necesario, organcese para que alguien le ayude con las actividades y responsabilidades cotidianas mientras est en cama.  Lleve un registro de la cantidad y la saturacin de las toallas higinicas que Medical laboratory scientific officer. Anote este dato.  No use tampones.No se haga duchas vaginales.  No tenga relaciones sexuales u orgasmos hasta que el mdico la autorice.  Si elimina tejido por la vagina, gurdelo para mostrrselo al  [employer].  [place] solo medicamentos de venta libre o recetados, segn las indicaciones del mdico.  No tome aspirina, ya que puede causar hemorragias.  Cumpla con todas las visitas de control, segn le indique su mdico. SOLICITE ATENCIN MDICA SI:  Tiene un sangrado vaginal en cualquier momento del embarazo.  Tiene calambres o dolores de [place].  Tiene fiebre que los medicamentos no Engineer, petroleum. SOLICITE ATENCIN MDICA DE INMEDIATO SI:   Siente calambres intensos en la espalda o en el vientre (abdomen).  Elimina cogulos grandes o tejido por la vagina.  La hemorragia aumenta.  Si se siente mareada, dbil o se desmaya.  Tiene escalofros.  Tiene una prdida importante o sale lquido a borbotones por la vagina.  Se desmaya mientras defeca. ASEGRESE DE QUE:  Comprende estas instrucciones.  Controlar su afeccin.  Recibir ayuda de inmediato si no mejora o si empeora. Document Released: 05/10/2005 Document Revised: 08/05/2013 [facility] Patient Information 2015 [place]. This information is not intended to replace advice given to you by your health care provider. Make sure you discuss any questions you have with your health care provider.
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
## 2015-02-08 LAB — GC/CHLAMYDIA PROBE AMP (~~LOC~~) NOT AT ARMC
CHLAMYDIA, DNA PROBE: NEGATIVE
NEISSERIA GONORRHEA: NEGATIVE

## 2015-04-02 ENCOUNTER — Other Ambulatory Visit (HOSPITAL_COMMUNITY): Payer: Self-pay | Admitting: Nurse Practitioner

## 2015-04-02 DIAGNOSIS — Z3A19 19 weeks gestation of pregnancy: Secondary | ICD-10-CM

## 2015-04-02 DIAGNOSIS — Z3689 Encounter for other specified antenatal screening: Secondary | ICD-10-CM

## 2015-04-02 LAB — OB RESULTS CONSOLE HGB/HCT, BLOOD
HCT: 32 %
Hemoglobin: 9.3 g/dL

## 2015-04-02 LAB — OB RESULTS CONSOLE ABO/RH: RH Type: POSITIVE

## 2015-04-02 LAB — OB RESULTS CONSOLE GC/CHLAMYDIA
CHLAMYDIA, DNA PROBE: NEGATIVE
GC PROBE AMP, GENITAL: NEGATIVE

## 2015-04-02 LAB — OB RESULTS CONSOLE RUBELLA ANTIBODY, IGM: Rubella: IMMUNE

## 2015-04-02 LAB — OB RESULTS CONSOLE PLATELET COUNT: PLATELETS: 334 10*3/uL

## 2015-04-02 LAB — GLUCOSE TOLERANCE, 1 HOUR (50G) W/O FASTING: Glucose 1 Hr Prenatal, POC: 177 mg/dL

## 2015-04-02 LAB — OB RESULTS CONSOLE RPR: RPR: NONREACTIVE

## 2015-04-02 LAB — CYTOLOGY - PAP: CYTOLOGY - PAP: NEGATIVE

## 2015-04-02 LAB — OB RESULTS CONSOLE ANTIBODY SCREEN: ANTIBODY SCREEN: NEGATIVE

## 2015-04-02 LAB — OB RESULTS CONSOLE HEPATITIS B SURFACE ANTIGEN: Hepatitis B Surface Ag: NEGATIVE

## 2015-04-02 LAB — OB RESULTS CONSOLE HIV ANTIBODY (ROUTINE TESTING): HIV: NONREACTIVE

## 2015-04-02 LAB — CULTURE, OB URINE: URINE CULTURE, OB: NEGATIVE

## 2015-04-02 LAB — SICKLE CELL SCREEN: Sickle Cell Screen: NORMAL

## 2015-04-02 LAB — OB RESULTS CONSOLE VARICELLA ZOSTER ANTIBODY, IGG: VARICELLA IGG: IMMUNE

## 2015-04-05 LAB — GLUCOSE, 3 HOUR GESTATIONAL
GLUCOSE 3 HOUR GTT: 113 mg/dL (ref ?–140)
GLUCOSE FASTING: 114 mg/dL — AB (ref 60–109)
Glucose, GTT - 1 Hour: 231 mg/dL — AB (ref ?–200)
Glucose, GTT - 2 Hour: 186 mg/dL — AB (ref ?–140)

## 2015-04-05 LAB — AFP MATERNAL TRIPLE SCREEN: Urine Culture, OB: NEGATIVE

## 2015-04-10 ENCOUNTER — Inpatient Hospital Stay (HOSPITAL_COMMUNITY)
Admission: AD | Admit: 2015-04-10 | Discharge: 2015-04-10 | Disposition: A | Discharge status: Home / Self Care | Payer: Self-pay | Source: Ambulatory Visit | Attending: Obstetrics and Gynecology | Admitting: Obstetrics and Gynecology

## 2015-04-10 ENCOUNTER — Encounter (HOSPITAL_COMMUNITY): Payer: Self-pay | Admitting: *Deleted

## 2015-04-10 DIAGNOSIS — R109 Unspecified abdominal pain: Secondary | ICD-10-CM | POA: Insufficient documentation

## 2015-04-10 DIAGNOSIS — J069 Acute upper respiratory infection, unspecified: Secondary | ICD-10-CM | POA: Insufficient documentation

## 2015-04-10 DIAGNOSIS — O26899 Other specified pregnancy related conditions, unspecified trimester: Secondary | ICD-10-CM

## 2015-04-10 DIAGNOSIS — Z3A19 19 weeks gestation of pregnancy: Secondary | ICD-10-CM | POA: Insufficient documentation

## 2015-04-10 DIAGNOSIS — O99512 Diseases of the respiratory system complicating pregnancy, second trimester: Secondary | ICD-10-CM | POA: Insufficient documentation

## 2015-04-10 DIAGNOSIS — K5900 Constipation, unspecified: Secondary | ICD-10-CM | POA: Insufficient documentation

## 2015-04-10 DIAGNOSIS — O09522 Supervision of elderly multigravida, second trimester: Secondary | ICD-10-CM | POA: Insufficient documentation

## 2015-04-10 LAB — URINALYSIS, ROUTINE W REFLEX MICROSCOPIC
Bilirubin Urine: NEGATIVE
GLUCOSE, UA: NEGATIVE mg/dL
Hgb urine dipstick: NEGATIVE
KETONES UR: NEGATIVE mg/dL
LEUKOCYTES UA: NEGATIVE
Nitrite: NEGATIVE
Protein, ur: NEGATIVE mg/dL
Specific Gravity, Urine: 1.015 (ref 1.005–1.030)
Urobilinogen, UA: 0.2 mg/dL (ref 0.0–1.0)
pH: 6 (ref 5.0–8.0)

## 2015-04-10 MED ORDER — DOCUSATE SODIUM 100 MG PO CAPS: 100.0000 mg | ORAL_CAPSULE | Freq: Two times a day (BID) | ORAL | Status: DC

## 2015-04-10 MED ORDER — ACETAMINOPHEN 325 MG PO TABS
650.0000 mg | ORAL_TABLET | Freq: Once | ORAL | Status: AC
  Administered 2015-04-10: 650 mg via ORAL
  Filled 2015-04-10: qty 2

## 2015-04-10 MED ORDER — DM-GUAIFENESIN ER 30-600 MG PO TB12: 1.0000 | ORAL_TABLET | Freq: Two times a day (BID) | ORAL | Status: DC

## 2015-04-10 MED ORDER — CYCLOBENZAPRINE HCL 10 MG PO TABS
10.0000 mg | ORAL_TABLET | Freq: Once | ORAL | Status: AC
  Administered 2015-04-10: 10 mg via ORAL
  Filled 2015-04-10: qty 1

## 2015-04-10 MED ORDER — DM-GUAIFENESIN ER 30-600 MG PO TB12
1.0000 | ORAL_TABLET | Freq: Once | ORAL | Status: AC
  Administered 2015-04-10: 1 via ORAL
  Filled 2015-04-10: qty 1

## 2015-04-10 MED ORDER — CYCLOBENZAPRINE HCL 10 MG PO TABS: 10.0000 mg | ORAL_TABLET | Freq: Two times a day (BID) | ORAL | Status: DC | PRN

## 2015-04-10 NOTE — Discharge Instructions (Signed)
Dolor abdominal en el embarazo (Abdominal Pain During Pregnancy) El dolor abdominal es frecuente durante el embarazo. Generalmente no causa ningn dao. El dolor abdominal puede tener numerosas causas. Algunas causas son ms graves que otras. Ciertas causas de dolor abdominal durante el embarazo se diagnostican fcilmente. A veces, se tarda un tiempo para llegar al diagnstico. Otras veces la causa no se conoce. El dolor abdominal puede estar relacionado con [place] alteracin del [place], o puede deberse a una causa totalmente diferente. Por este motivo, siempre consulte a su mdico cuando sienta molestias abdominales. INSTRUCCIONES PARA EL CUIDADO EN EL HOGAR  Est atenta al dolor para ver si hay cambios. Las siguientes indicaciones ayudarn a [employer] pueda sentir:  No Consulting civil engineer sexuales y no coloque nada dentro de la vagina hasta que los sntomas hayan desaparecido completamente.  Descanse todo lo que pueda [employer] dolor se le haya calmado.  Si siente nuseas, beba lquidos claros. Evite los alimentos slidos mientras sienta malestar o tenga nuseas.  Tome slo medicamentos de venta libre o recetados, segn las indicaciones del mdico.  Cumpla con todas las visitas de control, segn le indique su mdico. SOLICITE ATENCIN MDICA DE INMEDIATO SI:  Tiene un sangrado, prdida de lquidos o elimina tejidos por la vagina.  El dolor o los clicos [place].  Tiene vmitos persistentes.  Comienza a Education officer, environmental al orinar u [employer]  Tiene fiebre.  Nota que los movimientos del beb disminuyen.  Siente intensa debilidad o se marea.  Tiene dificultad para respirar con o sin dolor abdominal.  Siente un dolor de cabeza intenso junto al dolor abdominal.  Wilma Flavin secrecin vaginal anormal con dolor abdominal.  Tiene diarrea persistente.  El dolor abdominal sigue o empeora an despus de Magazine features editor. ASEGRESE DE QUE:   Comprende estas  instrucciones.  Controlar su afeccin.  Recibir ayuda de inmediato si no mejora o si empeora. Document Released: 07/31/2005 Document Revised: 05/21/2013 [facility] Patient Information 2015 [place], [place]. This information is not intended to replace advice given to you by your health care provider. Make sure you discuss any questions you have with your health care provider. Safe Medications in Pregnancy   Acne: Benzoyl Peroxide Salicylic Acid  Backache/Headache: Tylenol: 2 regular strength every 4 hours OR              2 Extra strength every 6 hours  Colds/Coughs/Allergies: Benadryl (alcohol free) 25 mg every 6 hours as needed Breath right strips Claritin Cepacol throat lozenges Chloraseptic throat spray Cold-Eeze- up to three times per day Cough drops, alcohol free Flonase (by prescription only) Guaifenesin Mucinex Robitussin DM (plain only, alcohol free) Saline nasal spray/drops Sudafed (pseudoephedrine) & Actifed ** use only after [redacted] weeks gestation and if you do not have high blood pressure Tylenol Vicks Vaporub Zinc lozenges Zyrtec   Constipation: Colace Ducolax suppositories Fleet enema Glycerin suppositories Metamucil Milk of magnesia Miralax Senokot Smooth move tea  Diarrhea: Kaopectate Imodium A-D  *NO pepto Bismol  Hemorrhoids: Anusol Anusol HC Preparation H Tucks  Indigestion: Tums Maalox Mylanta Zantac  Pepcid  Insomnia: Benadryl (alcohol free) 25mg  every 6 hours as needed Tylenol PM Unisom, no Gelcaps  Leg Cramps: Tums MagGel  Nausea/Vomiting:  Bonine Dramamine Emetrol Ginger extract Sea bands Meclizine  Nausea medication to take during pregnancy:  Unisom (doxylamine succinate 25 mg tablets) Take one tablet daily at bedtime. If symptoms are not adequately controlled, the dose can be increased to a maximum recommended dose of two tablets daily (  1/2 tablet in the morning, 1/2 tablet mid-afternoon and one at  bedtime). Vitamin B6 100mg  tablets. Take one tablet twice a day (up to 200 mg per day).  Skin Rashes: Aveeno products Benadryl cream or 25mg  every 6 hours as needed Calamine Lotion 1% cortisone cream  Yeast infection: Gyne-lotrimin 7 Monistat 7   **If taking multiple medications, please check labels to avoid duplicating the same active ingredients **take medication as directed on the label ** Do not exceed 4000 mg of tylenol in 24 hours **Do not take medications that contain aspirin or ibuprofen Round Ligament Pain During Pregnancy   Round ligament pain is a sharp pain or jabbing feeling often felt in the lower belly or groin area on one or both sides. It is one of the most common complaints during pregnancy and is considered a normal part of pregnancy. It is most often felt during the second trimester.   Here is what you need to know about round ligament pain, including some tips to help you feel better.   Causes of Round Ligament Pain:    Several thick ligaments surround and support your womb (uterus) as it grows during pregnancy. One of them is called the round ligament.   The round ligament connects the front part of the womb to your groin, the area where your legs attach to your pelvis. The round ligament normally tightens and relaxes slowly.   As your baby and womb grow, the round ligament stretches. That makes it more likely to become strained.   Sudden movements can cause the ligament to tighten quickly, like a rubber band snapping. This causes a sudden and quick jabbing feeling.   Symptoms of Round Ligament Pain   Round ligament pain can be concerning and uncomfortable. But it is considered normal as your body changes during pregnancy.   The symptoms of round ligament pain include a sharp, sudden spasm in the belly. It usually affects the right side, but it may happen on both sides. The pain only lasts a few seconds.   Exercise may cause the pain, as will rapid  movements such as:   sneezing  coughing  laughing  rolling over in bed  standing up too quickly   Treatment of Round Ligament Pain   Here are some tips that may help reduce your discomfort:   Pain relief. Take over-the-counter acetaminophen for pain, if necessary. Ask your doctor if this is OK.   Exercise. Get plenty of exercise to keep your stomach (core) muscles strong. Doing stretching exercises or prenatal yoga can be helpful. Ask your doctor which exercises are safe for you and your baby.   A helpful exercise involves putting your hands and knees on the floor, lowering your head, and pushing your backside into the air.   Avoid sudden movements. Change positions slowly (such as standing up or sitting down) to avoid sudden movements that may cause stretching and pain.   Flex your hips. Bend and flex your hips before you cough, sneeze, or laugh to avoid pulling on the ligaments.   Apply warmth. A heating pad or warm bath may be helpful. Ask your doctor if this is OK. Extreme heat can be dangerous to the baby.   You should try to modify your daily activity level and avoid positions that may worsen the condition.   When to Call the Doctor/Midwife   Always tell your doctor or midwife about any type of pain you have during pregnancy. Round ligament pain is quick and  doesn't last long.   Call your health care provider immediately if you have:   severe pain  fever  chills  pain on urination  difficulty walking   Belly pain during pregnancy can be due to many different causes. It is important for your doctor to rule out more serious conditions, including pregnancy complications such as placenta abruption or non-pregnancy illnesses such as:   inguinal hernia  appendicitis  stomach, liver, and kidney problems  Preterm labor pains may sometimes be mistaken for round ligament pain.

## 2015-04-10 NOTE — MAU Provider Note (Signed)
History     CSN: [id number]  Arrival date and time: 04/10/15 1226   None     Chief Complaint  Patient presents with  . Abdominal Pain  . Cold    HPI Pt is 39 yo G4P2012 at 38 weeks1d, patient of [facility] due to diabetes. Pt c/o of sore throat, cough and abd pain. Pt states that she has had sore throat with cold/sinus congestion and coughing for about 8 days. Pt states that she has had chills but not sure if fever- does not have thermometer, Pt has coughed up phlegm.  Pt has nasal congestion with transparent/clear mucous and sneezing a lot. Pt states those symptoms have improved but persist. Pt has taken Tylenol.  Pt states her child has also had cold "flu". Pt states she has had abdominal pain that started yesterday.  Pt's pain is bilateral lower but also has bilateral upper abdominal discomfort also.  Pt is worse with cough or moving. Pt has been constipated but had a hard BM this morning without pain; pt's abdominal pain improved some.   RN note:  Registered Nurse Signed  MAU Note 04/10/2015 12:42 PM    Expand All Collapse All   Patient presents at [redacted] weeks gestation with c/o abdominal pain and a cold. States the abdominal pain is both upper and lower and radiates to her mid back. States she has a little headache, sore throat, congestion and cough. Denies bleeding or discharge.       Past Medical History  Diagnosis Date  . History of chlamydia 2008  . AMA (advanced maternal age) multigravida 57+   . Obese   . History of candidal vulvovaginitis     Past Surgical History  Procedure Laterality Date  . Cesarean section  05/21/2011    Procedure: CESAREAN SECTION;  Surgeon: Jonnie Kind, MD;  Location: [facility];  Service: Gynecology;  Laterality: N/A;  Primary cesarean section with delivery of baby girl at 67. Apgars 9/9.    Family History  Problem Relation Age of Onset  . Diabetes Mother     oral agents  . Hypertension Mother   . Anesthesia problems Neg Hx   .  Hypotension Neg Hx   . Malignant hyperthermia Neg Hx   . Pseudochol deficiency Neg Hx     Social History  Substance Use Topics  . Smoking status: Never Smoker   . Smokeless tobacco: Never Used  . Alcohol Use: No    Allergies: No Known Allergies  Prescriptions prior to admission  Medication Sig Dispense Refill Last Dose  . acetaminophen (TYLENOL) 500 MG tablet Take 1,000 mg by mouth every 6 (six) hours as needed for mild pain.    Past Month at Unknown time  . cephALEXin (KEFLEX) 500 MG capsule Take 1 capsule (500 mg total) by mouth 4 (four) times daily. 56 capsule 0 02/06/2015 at Unknown time  . OVER THE COUNTER MEDICATION Take 1-2 tablets by mouth daily. Over the counter [facility] iron tablet not sure what miligram   Past Week at Unknown time  . Prenatal Vit-Fe Fumarate-FA (PRENATAL MULTIVITAMIN) TABS tablet Take 1 tablet by mouth daily at 12 noon.   02/06/2015 at Unknown time    Review of Systems  Constitutional: Positive for chills and malaise/fatigue.  HENT: Positive for congestion and sore throat.   Respiratory: Positive for cough. Negative for wheezing.   Gastrointestinal: Positive for nausea. Negative for vomiting, abdominal pain, diarrhea and constipation.  Genitourinary: Negative for dysuria and urgency.  Neurological:  Positive for headaches.   Physical Exam   Blood pressure 114/55, pulse 89, temperature 98 F (36.7 C), temperature source Oral, resp. rate 20, height 5\' 4"  (1.626 m), weight 220 lb (99.791 kg), last menstrual period 12/09/2014.  Physical Exam  Nursing note and vitals reviewed. Constitutional: She is oriented to person, place, and time. She appears well-developed and well-nourished. No distress.  HENT:  Head: Normocephalic.  Mouth/Throat: No oropharyngeal exudate.  pharynx mildly redened and edematous; no exudate Submandibular nodes without enlargment  Eyes: Pupils are equal, round, and reactive to light.  Neck: Normal range of motion. Neck supple.   Cardiovascular: Normal rate, regular rhythm and normal heart sounds.   Respiratory: Effort normal. No respiratory distress. She has no wheezes. She has no rales.  GI: Soft.  Genitourinary:  Cervix closed  Musculoskeletal: Normal range of motion.  Neurological: She is alert and oriented to person, place, and time.  Skin: Skin is warm and dry.  Psychiatric: She has a normal mood and affect.    MAU Course  Procedures Mucinex DM Tylenol   Assessment and Plan  Abdominal pain in pregnancy 2nd trimester Recommend pregnancy girdle; Rx Flexeril if needed Constipation- increase in fruit and fiber; Rx Colace URI- Rx Mucinex DM Keep appointment in [place] 04/10/2015, 1:01 PM

## 2015-04-10 NOTE — MAU Note (Signed)
Patient presents at [redacted] weeks gestation with c/o abdominal pain and a cold. States the abdominal pain is both upper and lower and radiates to her mid back. States she has a little headache, sore throat, congestion and cough. Denies bleeding or discharge.

## 2015-04-12 ENCOUNTER — Other Ambulatory Visit (HOSPITAL_COMMUNITY): Payer: Self-pay | Admitting: Nurse Practitioner

## 2015-04-12 ENCOUNTER — Ambulatory Visit (HOSPITAL_COMMUNITY)
Admission: RE | Admit: 2015-04-12 | Discharge: 2015-04-12 | Disposition: A | Discharge status: Home / Self Care | Payer: Self-pay | Source: Ambulatory Visit | Attending: Nurse Practitioner | Admitting: Nurse Practitioner

## 2015-04-12 DIAGNOSIS — O24419 Gestational diabetes mellitus in pregnancy, unspecified control: Secondary | ICD-10-CM

## 2015-04-12 DIAGNOSIS — Z3689 Encounter for other specified antenatal screening: Secondary | ICD-10-CM

## 2015-04-12 DIAGNOSIS — Z36 Encounter for antenatal screening of mother: Secondary | ICD-10-CM | POA: Insufficient documentation

## 2015-04-12 DIAGNOSIS — O09522 Supervision of elderly multigravida, second trimester: Secondary | ICD-10-CM

## 2015-04-12 DIAGNOSIS — Z3A19 19 weeks gestation of pregnancy: Secondary | ICD-10-CM

## 2015-04-26 ENCOUNTER — Ambulatory Visit (INDEPENDENT_AMBULATORY_CARE_PROVIDER_SITE_OTHER): Payer: Self-pay | Admitting: Obstetrics & Gynecology

## 2015-04-26 ENCOUNTER — Encounter: Payer: Self-pay | Admitting: Obstetrics & Gynecology

## 2015-04-26 ENCOUNTER — Encounter: Payer: Self-pay | Attending: Obstetrics & Gynecology | Admitting: *Deleted

## 2015-04-26 VITALS — BP 123/85 | HR 90 | Temp 98.7°F | Wt 224.8 lb

## 2015-04-26 DIAGNOSIS — Z713 Dietary counseling and surveillance: Secondary | ICD-10-CM | POA: Insufficient documentation

## 2015-04-26 DIAGNOSIS — O99212 Obesity complicating pregnancy, second trimester: Secondary | ICD-10-CM

## 2015-04-26 DIAGNOSIS — O99210 Obesity complicating pregnancy, unspecified trimester: Secondary | ICD-10-CM | POA: Insufficient documentation

## 2015-04-26 DIAGNOSIS — Z98891 History of uterine scar from previous surgery: Secondary | ICD-10-CM

## 2015-04-26 DIAGNOSIS — O3662X0 Maternal care for excessive fetal growth, second trimester, not applicable or unspecified: Secondary | ICD-10-CM

## 2015-04-26 DIAGNOSIS — IMO0002 Reserved for concepts with insufficient information to code with codable children: Secondary | ICD-10-CM

## 2015-04-26 DIAGNOSIS — N898 Other specified noninflammatory disorders of vagina: Secondary | ICD-10-CM

## 2015-04-26 DIAGNOSIS — O24419 Gestational diabetes mellitus in pregnancy, unspecified control: Secondary | ICD-10-CM | POA: Insufficient documentation

## 2015-04-26 DIAGNOSIS — Z8619 Personal history of other infectious and parasitic diseases: Secondary | ICD-10-CM | POA: Insufficient documentation

## 2015-04-26 DIAGNOSIS — O9989 Other specified diseases and conditions complicating pregnancy, childbirth and the puerperium: Secondary | ICD-10-CM

## 2015-04-26 DIAGNOSIS — E669 Obesity, unspecified: Secondary | ICD-10-CM

## 2015-04-26 DIAGNOSIS — O09522 Supervision of elderly multigravida, second trimester: Secondary | ICD-10-CM

## 2015-04-26 DIAGNOSIS — O09529 Supervision of elderly multigravida, unspecified trimester: Secondary | ICD-10-CM | POA: Insufficient documentation

## 2015-04-26 DIAGNOSIS — O99012 Anemia complicating pregnancy, second trimester: Secondary | ICD-10-CM

## 2015-04-26 DIAGNOSIS — Z23 Encounter for immunization: Secondary | ICD-10-CM

## 2015-04-26 DIAGNOSIS — Z789 Other specified health status: Secondary | ICD-10-CM

## 2015-04-26 DIAGNOSIS — Z603 Acculturation difficulty: Secondary | ICD-10-CM

## 2015-04-26 DIAGNOSIS — D649 Anemia, unspecified: Secondary | ICD-10-CM

## 2015-04-26 LAB — POCT URINALYSIS DIP (DEVICE)
Bilirubin Urine: NEGATIVE
GLUCOSE, UA: NEGATIVE mg/dL
Hgb urine dipstick: NEGATIVE
Ketones, ur: NEGATIVE mg/dL
Leukocytes, UA: NEGATIVE
NITRITE: NEGATIVE
PROTEIN: NEGATIVE mg/dL
SPECIFIC GRAVITY, URINE: 1.015 (ref 1.005–1.030)
UROBILINOGEN UA: 0.2 mg/dL (ref 0.0–1.0)
pH: 6 (ref 5.0–8.0)

## 2015-04-26 MED ORDER — ASPIRIN EC 81 MG PO TBEC: 81.0000 mg | DELAYED_RELEASE_TABLET | Freq: Every day | ORAL | Status: DC

## 2015-04-26 NOTE — Progress Notes (Signed)
Initial prenatal visit Breastfeeding tip of the week reviewed Diabetes/Nutrition Pt states nutrition has foul odor Flu vaccine given Lockie Mola Spanish Interpreter for encounter

## 2015-04-26 NOTE — Patient Instructions (Signed)
Parto vaginal despus de una cesrea (Vaginal Birth After Cesarean Delivery) Un parto vaginal despus de un parto por cesrea es dar a luz por la vagina despus de haber dado a luz por medio de una intervencin quirrgica. En el pasado, si una mujer tena un beb por cesrea, todos los partos posteriores deban hacerse por cesrea. Esto ya no es as. Puede ser seguro para la mam intentar un parto vaginal luego de una cesrea.  Es importante que converse con su mdico desde comienzos del embarazo de modo que pueda comprender los riesgos, beneficios y opciones. Le dar tiempo para decidir qu es lo mejor en su caso particular. La decisin final de tener un parto vaginal o por cesrea debe tomarse en conjunto, entre usted y el mdico. Cualquier cambio en su salud o la de su beb durante el embarazo puede ser motivo de un cambio de decisin respecto del parto vaginal.  LAS MUJERES QUE OPTAN POR EL PARTO VAGINAL, DEBEN CONSULTAR AL MDICO PARA ASEGURARSE DE QUE:  La cesrea anterior se haya realizado con un corte (incisin) uterino transversal (no con una incisin vertical clsica).  El canal de parto es lo suficientemente grande como para que pase el nio.  No ha sido sometida a otras operaciones del tero.  Durante el trabajo de parto, le realizarn un monitoreo fetal electrnico, en todo momento.  Habr un quirfano disponible y listo en caso de necesitar una cesrea de emergencia.  Un mdico y personal de quirfano estarn disponibles en todo momento durante el trabajo de parto, para realizar una cesrea en caso de ser necesario.  Habr un anestesista disponible en caso de necesitar una cesrea de emergencia.  La nursery est lista y cuenta con personal especializado y el equipo disponible para cuidar al beb en caso de emergencia. BENEFICIOS DEL PARTO VAGINAL:  Permanencia ms breve en el hospital.  Prevencin de los riesgos asociados con el parto por cesrea, por ejemplo:  Complicaciones  quirrgicas, como apertura o hernia de la incisin.  Lesiones en otros rganos.  Fiebre. Esto puede ocurrir si aparece una infeccin despus de la ciruga. Tambin puede ocurrir como reaccin a los medicamentos administrados para adormecerla durante la ciruga.  Menos prdida de sangre y menos probabilidad de necesitar una transfusin sangunea.  Menor riesgo de cogulos sanguneos e infeccin.  Tiempo ms corto de recuperacin.  Menor riesgo de remocin del tero (histerectoma).  Menor riesgo de que la placenta cubra parcial o completamente la abertura del tero (placenta previa) en embarazos futuros.  Menos riesgos en el trabajo de parto y el parto futuros. RIESGOS  Ruptura del tero. Esto ocurre en menos del 1% de los partos vaginales. El riesgo de que eso suceda es mayor si:  Se toman medidas para iniciar el proceso del trabajo de parto (inducir el parto) o estimular o intensificar las contracciones (aumentar el trabajo de parto).  Se usan medicamentos para ablandar (madurar) el cuello del tero.  Es necesario extraer el tero (histerectoma) si se rompe. No debe llevarse a cabo si:  La cesrea previa se realiz con una incisin vertical (clsica) o con forma de T, o usted no sabe cul de ellas le han practicado.  Ha sufrido ruptura del tero.  Ha tenido ciertos tipos de ciruga en el tero, como la extirpacin de fibromas uterinos. Pregntele a su mdico sobre otros tipos de cirugas que le impiden tener un parto vaginal.  Tiene ciertos problemas mdicos o relacionados con el parto (obsttricos).  El beb est en   problemas.  Tuvo dos cesreas previas y ningn parto vaginal. OTRAS COSAS QUE DEBE SABER:  La anestesia peridural es segura.  Es seguro dar vuelta al beb si se encuentra de nalgas (intentar una versin ceflica externa).  Es seguro intentarlo en caso de mellizos.  El parto vaginal puede no ser apropiado si el beb pesa 8,8lb (4kg) o ms. Sin embargo,  las predicciones de [place] no son siempre exactas y no deben ser lo nico a tenerse en cuenta para decidir si el parto vaginal es lo indicado para usted.  Hay aumento en el porcentaje de fracasos si el intervalo entre la cesrea y el parto vaginal es de menos de 19 meses.  Su mdico puede aconsejarle no tener un parto vaginal si tiene preeclampsia (hipertensin, protena en la orina e hinchazn en la cara y las extremidades).  El parto vaginal suele ser exitoso si ya tuvo un parto vaginal previamente.  Tambin suele ser exitoso cuando el trabajo de parto comienza espontneamente antes de la fecha.  El parto vaginal despus de [place] Else es similar a un parto espontneo vaginal normal. Document Released: 01/17/2008 Document Revised: 05/21/2013 [employer] Patient Information 2015 [place], [place]. This information is not intended to replace advice given to you by your health care provider. Make sure you discuss any questions you have with your health care provider.

## 2015-04-26 NOTE — Progress Notes (Signed)
  Patient was seen on 04/26/15 for Gestational Diabetes self-management . Spanish Interpreter [place] assist with visit.  The following learning objectives were met by the patient :   States the definition of Gestational Diabetes  States why dietary management is important in controlling blood glucose  States when to check blood glucose levels  Demonstrates proper blood glucose monitoring techniques  States the effect of stress and exercise on blood glucose levels  Plan:  Consider  increasing your activity level by walking daily as tolerated Begin checking BG before breakfast and 2 hours after first bit of breakfast, lunch and dinner after  as directed by MD  Take medication  as directed by MD  Blood glucose monitor given: TrueTrack Lot # [id number] Exp: 2017/04/13 Blood glucose reading: 88 2hpp  Patient instructed to monitor glucose levels: FBS: 60 - <90 2 hour: <120  Patient received the following handouts:  Nutrition Diabetes and Pregnancy  Carbohydrate Counting List  Meal Planning worksheet  Patient will be seen for follow-up as needed.

## 2015-04-26 NOTE — Progress Notes (Signed)
Appointment with Dr Frederico Hamman at [facility] scheduled for 05/21/2015 @9 :45AM Fetal echo scheduled with Dr Filbert Schilder at [facility] 05/31/2015 @ 9:30AM

## 2015-04-26 NOTE — Progress Notes (Signed)
Nutrition note: 1st visit consult & GDM diet education Pt was recently diagnosed with GDM & has h/o obesity. Pt has gained 5.8# @ [redacted]w[redacted]d, which is wnl. Pt reports eating 3 meals/d. Pt is taking a PNV. Pt reports no N&V but has heartburn occ. Pt reports walking for 15 mins 2x/wk. Pt received verbal & written education in Spanish via an interpreter about GDM diet. Encouraged walking ~30 mins/d. Discussed tips to decrease heartburn. Discussed wt gain goals of 11-20# or 0.5#/wk. Pt agrees to follow GDM diet with 3 meals & 1-3 snacks/d with proper CHO/ protein combination. Pt does not have [place] but plans to apply. Pt plans to BF. F/u in 2-4 wks Vladimir Faster, MS, RD, LDN, [facility]

## 2015-04-26 NOTE — Progress Notes (Signed)
Subjective:transfer from HD early Dx DM    Morgan Ramsey is a [id number] [redacted]w[redacted]d being seen today for her first obstetrical visit.  Her obstetrical history is significant for advanced maternal age and A2/B DM. Patient does intend to breast feed. Pregnancy history fully reviewed.  Patient reports no complaints.  Filed Vitals:   04/26/15 0811  BP: 123/85  Pulse: 90  Temp: 98.7 F (37.1 C)  Weight: 224 lb 12.8 oz (101.969 kg)    HISTORY: OB History  Gravida Para Term Preterm AB SAB TAB Ectopic Multiple Living  4 2 2  1  1   2     # Outcome Date GA Lbr Len/2nd Weight Sex Delivery Anes PTL Lv  4 Current           3 Term 05/21/11 [redacted]w[redacted]d 05:32 / 04:46 9 lb 6.1 oz (4.255 kg) F CS-LTranv EPI  Y  2 TAB 12/19/09        N  1 Term 02/18/05 [redacted]w[redacted]d  8 lb 10 oz (3.912 kg) F    Y     Past Medical History  Diagnosis Date  . History of chlamydia 2008  . AMA (advanced maternal age) multigravida 36+   . Obese   . History of candidal vulvovaginitis   . Gestational diabetes    Past Surgical History  Procedure Laterality Date  . Cesarean section  05/21/2011    Procedure: CESAREAN SECTION;  Surgeon: Jonnie Kind, MD;  Location: [facility];  Service: Gynecology;  Laterality: N/A;  Primary cesarean section with delivery of baby girl at 39. Apgars 9/9.   Family History  Problem Relation Age of Onset  . Diabetes Mother     oral agents  . Anesthesia problems Neg Hx   . Hypotension Neg Hx   . Malignant hyperthermia Neg Hx   . Pseudochol deficiency Neg Hx      Exam    Uterus:     Pelvic Exam:    Perineum: No Hemorrhoids   Vulva: normal   Vagina:  normal mucosa   pH: Wet  prep   Cervix: no cervical motion tenderness and no lesions   Adnexa: not evaluated   Bony Pelvis: average  System: Breast:     Skin: normal coloration and turgor, no rashes    Neurologic: oriented, normal mood   Extremities: normal strength, tone, and muscle mass   HEENT sclera clear, anicteric   Mouth/Teeth  dental hygiene good   Neck supple   Cardiovascular: regular rate and rhythm   Respiratory:  appears well, vitals normal, no respiratory distress, acyanotic, normal RR   Abdomen: soft, non-tender; bowel sounds normal; no masses,  no organomegaly   Urinary: urethral meatus normal      Assessment:    Pregnancy: [id number] Patient Active Problem List   Diagnosis Date Noted  . Language barrier 04/26/2015  . AMA (advanced maternal age) multigravida 35+ 04/26/2015  . H/O herpes simplex type 2 infection 04/26/2015  . Previous cesarean section 04/26/2015  . LGA (large for gestational age) fetus 04/26/2015  . Obesity 04/26/2015  . Anemia 04/26/2015  . Gestational diabetes mellitus, antepartum 04/26/2015  counseled re RCS vs VBAC      Plan:     Initial labs drawn.Baseline for DM. ASA RX Prenatal vitamins. Problem list reviewed and updated. Genetic Screening discussed Quad Screen: results reviewed.  Ultrasound discussed; fetal survey: results reviewed.  Follow up in 2 weeks. 50% of 30 min visit spent on counseling and coordination of care.  Opthalmology, fetal echo   Lyndal Reggio 04/26/2015

## 2015-04-27 ENCOUNTER — Telehealth: Payer: Self-pay | Admitting: General Practice

## 2015-04-27 LAB — WET PREP, GENITAL
CLUE CELLS WET PREP: NONE SEEN
TRICH WET PREP: NONE SEEN
Yeast Wet Prep HPF POC: NONE SEEN

## 2015-04-27 NOTE — Telephone Encounter (Signed)
Telephone call to patient regarding negative wet prep. Called patient with pacific intepreter [phone] and informed patient of negative results. Patient verbalized understanding and states this morning her blood sugar was 148. She would like to know what number it needs to be and is there a number too high that she needs to watch out for. Told patient we want the number 2 hours after eating to be under 120 and any number over 300 she should let someone know. Patient verbalized understanding and had no other questions

## 2015-04-29 LAB — COMPREHENSIVE METABOLIC PANEL
ALK PHOS: 80 U/L (ref 33–115)
ALT: 13 U/L (ref 6–29)
AST: 13 U/L (ref 10–30)
Albumin: 3.3 g/dL — ABNORMAL LOW (ref 3.6–5.1)
BUN: 8 mg/dL (ref 7–25)
CALCIUM: 9 mg/dL (ref 8.6–10.2)
CHLORIDE: 104 mmol/L (ref 98–110)
CO2: 25 mmol/L (ref 20–31)
Creat: 0.51 mg/dL (ref 0.50–1.10)
Glucose, Bld: 96 mg/dL (ref 65–99)
POTASSIUM: 4.2 mmol/L (ref 3.5–5.3)
Sodium: 137 mmol/L (ref 135–146)
TOTAL PROTEIN: 6.5 g/dL (ref 6.1–8.1)
Total Bilirubin: 0.2 mg/dL (ref 0.2–1.2)

## 2015-04-29 LAB — HEMOGLOBIN A1C
HEMOGLOBIN A1C: 5.6 % (ref <5.7)
Mean Plasma Glucose: 114 mg/dL (ref <117)

## 2015-05-01 LAB — PROTEIN, URINE, 24 HOUR
PROTEIN 24H UR: 234 mg/d — AB (ref <150)
PROTEIN, URINE: 8 mg/dL (ref 5–24)

## 2015-05-10 ENCOUNTER — Encounter: Payer: Self-pay | Admitting: Family Medicine

## 2015-05-17 ENCOUNTER — Ambulatory Visit (INDEPENDENT_AMBULATORY_CARE_PROVIDER_SITE_OTHER): Payer: Self-pay | Admitting: Obstetrics & Gynecology

## 2015-05-17 VITALS — BP 110/68 | HR 89 | Temp 97.8°F | Wt 221.2 lb

## 2015-05-17 DIAGNOSIS — O24419 Gestational diabetes mellitus in pregnancy, unspecified control: Secondary | ICD-10-CM

## 2015-05-17 LAB — POCT URINALYSIS DIP (DEVICE)
BILIRUBIN URINE: NEGATIVE
Glucose, UA: NEGATIVE mg/dL
Hgb urine dipstick: NEGATIVE
KETONES UR: NEGATIVE mg/dL
NITRITE: NEGATIVE
Protein, ur: NEGATIVE mg/dL
Specific Gravity, Urine: 1.025 (ref 1.005–1.030)
Urobilinogen, UA: 0.2 mg/dL (ref 0.0–1.0)
pH: 6.5 (ref 5.0–8.0)

## 2015-05-17 MED ORDER — GLYBURIDE 1.25 MG PO TABS: ORAL_TABLET | ORAL | Status: DC

## 2015-05-17 NOTE — Progress Notes (Signed)
Subjective:  Morgan Ramsey is a 39 y.o. [id number] at [redacted]w[redacted]d being seen today for ongoing prenatal care.  Patient reports no complaints.  Contractions: Not present.  Vag. Bleeding: None. Movement: Present. Denies leaking of fluid.   The following portions of the patient's history were reviewed and updated as appropriate: allergies, current medications, past family history, past medical history, past social history, past surgical history and problem list.   Objective:   Filed Vitals:   05/17/15 1019  BP: 110/68  Pulse: 89  Temp: 97.8 F (36.6 C)  Weight: 221 lb 3.2 oz (100.336 kg)    Fetal Status: Fetal Heart Rate (bpm): 147   Movement: Present     General:  Alert, oriented and cooperative. Patient is in no acute distress.  Skin: Skin is warm and dry. No rash noted.   Cardiovascular: Normal heart rate noted  Respiratory: Normal respiratory effort, no problems with respiration noted  Abdomen: Soft, gravid, appropriate for gestational age. Pain/Pressure: Present     Pelvic: Vag. Bleeding: None Vag D/C Character: White   Cervical exam deferred        Extremities: Normal range of motion.  Edema: None  Mental Status: Normal mood and affect. Normal behavior. Normal judgment and thought content.   Urinalysis:      Assessment and Plan:  Pregnancy: [id number] at [redacted]w[redacted]d  1. Gestational diabetes mellitus, antepartum All fastings are 90s to 100s.  Post prandia--many 120s-130s.  Will start glyburide 1.25 mg bid.    Preterm labor symptoms and general obstetric precautions including but not limited to vaginal bleeding, contractions, leaking of fluid and fetal movement were reviewed in detail with the patient. Please refer to After Visit Summary for other counseling recommendations.  Return in about 1 week (around 05/24/2015).   Guss Bunde, MD

## 2015-05-17 NOTE — Progress Notes (Signed)
Arbie Cookey used for Spanish interpreter Reviewed tip of week with patient

## 2015-05-24 ENCOUNTER — Ambulatory Visit (INDEPENDENT_AMBULATORY_CARE_PROVIDER_SITE_OTHER): Payer: Self-pay | Admitting: Obstetrics & Gynecology

## 2015-05-24 VITALS — BP 101/70 | HR 85 | Temp 97.5°F | Wt 220.9 lb

## 2015-05-24 DIAGNOSIS — O24419 Gestational diabetes mellitus in pregnancy, unspecified control: Secondary | ICD-10-CM

## 2015-05-24 LAB — POCT URINALYSIS DIP (DEVICE)
Bilirubin Urine: NEGATIVE
Glucose, UA: NEGATIVE mg/dL
KETONES UR: NEGATIVE mg/dL
Nitrite: NEGATIVE
PH: 5.5 (ref 5.0–8.0)
PROTEIN: 30 mg/dL — AB
Urobilinogen, UA: 0.2 mg/dL (ref 0.0–1.0)

## 2015-05-24 NOTE — Progress Notes (Signed)
Subjective:  Morgan Ramsey is a 39 y.o. [id number] at [redacted]w[redacted]d being seen today for ongoing prenatal care.  Patient reports no complaints.  Contractions: Not present.  Vag. Bleeding: None. Movement: Present. Denies leaking of fluid.   The following portions of the patient's history were reviewed and updated as appropriate: allergies, current medications, past family history, past medical history, past social history, past surgical history and problem list.   Objective:   Filed Vitals:   05/24/15 0926  BP: 101/70  Pulse: 85  Temp: 97.5 F (36.4 C)  Weight: 220 lb 14.4 oz (100.2 kg)    Fetal Status: Fetal Heart Rate (bpm): 146   Movement: Present     General:  Alert, oriented and cooperative. Patient is in no acute distress.  Skin: Skin is warm and dry. No rash noted.   Cardiovascular: Normal heart rate noted  Respiratory: Normal respiratory effort, no problems with respiration noted  Abdomen: Soft, gravid, appropriate for gestational age. Pain/Pressure: Absent     Pelvic: Vag. Bleeding: None     Cervical exam deferred        Extremities: Normal range of motion.  Edema: Trace  Mental Status: Normal mood and affect. Normal behavior. Normal judgment and thought content.   Urinalysis: Urine Protein: 1+ Urine Glucose: Negative  Assessment and Plan:  Pregnancy: [id number] at [redacted]w[redacted]d  1. Gestational diabetes mellitus, antepartum fbs most in range occasional above 100, PP >90 % in range, continue current management  Preterm labor symptoms and general obstetric precautions including but not limited to vaginal bleeding, contractions, leaking of fluid and fetal movement were reviewed in detail with the patient. Please refer to After Visit Summary for other counseling recommendations.  Return in about 2 weeks (around 06/07/2015). Counseled again re VBAC vs CS  Woodroe Mode, MD

## 2015-05-24 NOTE — Patient Instructions (Addendum)
Segundo trimestre de Media planner (Second Trimester of Pregnancy) El segundo trimestre va desde la semana13 hasta la 69, desde el cuarto hasta el sexto mes, y suele ser el momento en el que mejor se siente. Su organismo se ha adaptado a Public relations account executive y comienza a Print production planner. En general, las nuseas matutinas han disminuido o han desaparecido completamente, puede tener ms energa y un aumento de apetito. El segundo trimestre es tambin la poca en la que el feto se desarrolla rpidamente. Hacia el final del sexto mes, el feto mide aproximadamente 9pulgadas (23cm) y pesa alrededor de 1 libras (700g). Es probable que sienta que el beb se Software engineer (da pataditas) entre las 13 y 20semanas del Media planner. CAMBIOS EN EL ORGANISMO Su organismo atraviesa por muchos cambios durante el [place], y estos varan de Ardelia Mems mujer a Theatre manager.   Seguir [employer]. Notar que la parte baja del abdomen sobresale.  Podrn aparecer las primeras Apache Corporation caderas, el abdomen y las [place].  Es posible que tenga dolores de cabeza que pueden aliviarse con los medicamentos que el mdico le permita tomar.  Tal vez tenga necesidad de orinar con ms frecuencia porque el feto est ejerciendo presin Field seismologist.  Debido al Glennis Brink podr sentir Victorio Palm estomacal con frecuencia.  Puede estar estreida, ya que ciertas hormonas enlentecen los movimientos de los msculos que [employer] desechos a travs de los intestinos.  Pueden aparecer hemorroides o abultarse e hincharse las venas (venas varicosas).  Puede tener dolor de espalda que se debe al Southern Company de peso y a que las hormonas del Scientist, research (life sciences) las articulaciones entre los huesos de la pelvis, y Civil Service fast streamer consecuencia de la modificacin del peso y los msculos que mantienen el equilibrio.  Las [employer] Corporation seguirn creciendo y Teaching laboratory technician.  Las Production manager y estar sensibles al cepillado y al hilo dental.  Pueden aparecer zonas oscuras o  manchas (cloasma, mscara del Media planner) en el rostro que probablemente se atenuar despus del nacimiento del beb.  Es posible que se forme una lnea oscura desde el ombligo hasta la zona del pubis (linea nigra) que probablemente se atenuar despus del nacimiento del beb.  Tal vez haya cambios en el cabello que pueden incluir su engrosamiento, crecimiento rpido y cambios en la textura. Adems, a algunas mujeres se les cae el cabello durante o despus del embarazo, o tienen el cabello seco o fino. Lo ms probable es que el cabello se le normalice despus del nacimiento del beb. QU DEBE ESPERAR EN LAS CONSULTAS PRENATALES Durante una visita prenatal de rutina:  La pesarn para asegurarse de que usted y el feto estn creciendo normalmente.  Le tomarn la presin arterial.  Le medirn el abdomen para controlar el desarrollo del beb.  Se escucharn los latidos cardacos fetales.  Se evaluarn los resultados de los estudios solicitados en visitas anteriores. El mdico puede preguntarle lo siguiente:  Cmo se siente.  Si siente los movimientos del beb.  Si ha tenido sntomas anormales, como prdida de lquido, [place], dolores de cabeza intensos o clicos abdominales.  Si est consumiendo algn producto que contenga tabaco, como cigarrillos, tabaco de Higher education careers adviser y Psychologist, sport and exercise.  Si tiene [place]. Otros estudios que podrn realizarse durante el segundo trimestre incluyen lo siguiente:  Anlisis de sangre para detectar lo siguiente:  Concentraciones de hierro bajas (anemia).  Diabetes gestacional (entre la semana 24 y la 17).  Anticuerpos Rh.  Anlisis de orina para detectar infecciones, diabetes o protenas en la  Gerarda Gunther ecografa para confirmar que el beb crece y se desarrolla correctamente.  Una amniocentesis para diagnosticar posibles problemas genticos.  Estudios del feto para descartar espina bfida y sndrome de Down.  Prueba del VIH (virus  de inmunodeficiencia humana). Los exmenes prenatales de rutina incluyen la prueba de deteccin del VIH, a menos que decida no Radiation protection practitioner. INSTRUCCIONES PARA EL CUIDADO EN EL HOGAR   Evite fumar, consumir hierbas, beber alcohol y tomar frmacos que no le hayan recetado. Estas sustancias qumicas afectan la formacin y el desarrollo del beb.  No consuma ningn producto que contenga tabaco, lo que incluye cigarrillos, tabaco de Higher education careers adviser y Psychologist, sport and exercise. Si necesita ayuda para dejar de fumar, consulte al [employer]. Puede recibir asesoramiento y otro tipo de recursos para dejar de fumar.  [place] mdico en relacin con el uso de medicamentos. Durante el embarazo, hay medicamentos que son seguros de tomar y otros que no.  Haga ejercicio solamente como se lo haya indicado el mdico. Sentir clicos uterinos es un buen signo para Ambulance person actividad fsica.  Contine comiendo alimentos sanos con regularidad.  Use un sostn que le brinde buen soporte si le Nordstrom.  No se d baos de inmersin en agua caliente, baos turcos ni saunas.  Use el cinturn de seguridad en todo momento mientras conduce.  No coma carne cruda ni queso sin cocinar; evite el contacto con las bandejas sanitarias de los gatos y la tierra que estos animales usan. Estos elementos contienen grmenes que pueden causar defectos congnitos en el beb.  [place].  Tome entre 1500 y 2000mg  de calcio diariamente comenzando en la [id number] del embarazo [place] .  Si est estreida, pruebe un laxante suave (si el mdico lo autoriza). Consuma ms alimentos ricos en fibra, como vegetales y frutas frescos y Psychologist, prison and probation services. Beba gran cantidad de lquido para mantener la orina de tono claro o color amarillo plido.  Dese baos de asiento con agua tibia para Best boy o las molestias causadas por las hemorroides. Use una crema para las hemorroides si el mdico la  autoriza.  Si tiene venas varicosas, use medias de descanso. Eleve los pies durante 74minutos, 3 o 4veces por da. Limite el consumo de sal en su dieta.  No levante objetos pesados, use zapatos de tacones bajos y mantenga una buena postura.  Descanse con las piernas elevadas si tiene calambres o dolor de cintura.  Visite a su dentista si an no lo ha [place] manager. Use un cepillo de dientes blando para higienizarse los dientes y psese el hilo dental con suavidad.  Puede seguir [employer] Power, a menos que el mdico le indique lo contrario.  Concurra a todas las visitas prenatales segn las indicaciones de su mdico. SOLICITE ATENCIN MDICA SI:   Natale [place].  Siente clicos leves, presin en la pelvis o dolor persistente en el abdomen.  Tiene nuseas, vmitos o diarrea persistentes.  Margette Fast secrecin vaginal con mal olor.  Siente dolor al Continental Airlines. SOLICITE ATENCIN MDICA DE INMEDIATO SI:   Tiene fiebre.  Tiene una prdida de lquido por la vagina.  Tiene sangrado o pequeas prdidas vaginales.  Siente dolor intenso o clicos en el abdomen.  Sube o baja de peso rpidamente.  Tiene dificultad para respirar y siente dolor de pecho.  Sbitamente se le hinchan mucho el rostro, las [place], los tobillos, los pies o las piernas.  No ha sentido los movimientos del beb durante  Leone Brand.  Siente un dolor de cabeza intenso que no se alivia con medicamentos.  Su visin se modifica.   Esta informacin no tiene Marine scientist el consejo del mdico. Asegrese de hacerle al mdico cualquier pregunta que tenga.   Document Released: 05/10/2005 Document Revised: 08/21/2014 Elsevier Interactive Patient Education 2016 [place] vaginal despus de Ardelia Mems cesrea (Vaginal Birth After Cesarean Delivery) Un parto vaginal despus de un parto por cesrea es dar a luz por la vagina despus de haber dado a luz por medio de una intervencin  [place]. En el pasado, si una mujer tena un beb por cesrea, todos los partos posteriores deban hacerse por cesrea. Esto ya no es as. Puede ser seguro para la mam intentar un parto vaginal luego de una cesrea.  Es importante que converse con su mdico desde comienzos del Media planner de modo que pueda [employer], beneficios y opciones. Le dar tiempo para decidir qu es lo mejor en su caso particular. La decisin final de tener un parto vaginal o por cesrea debe tomarse en conjunto, entre usted y el mdico. Cualquier cambio en su salud o la de su beb durante el embarazo puede ser motivo de un cambio de decisin respecto del parto vaginal.  LAS MUJERES QUE OPTAN POR EL PARTO VAGINAL, DEBEN CONSULTAR AL MDICO PARA ASEGURARSE DE QUE: La cesrea anterior se haya realizado con un corte (incisin) uterino transversal (no con una incisin vertical clsica). El canal de parto es lo suficientemente grande como para que pase el [place]. No ha sido sometida a otras operaciones del tero. Durante el trabajo de parto, le realizarn un monitoreo fetal Emergency planning/management officer, en todo momento. Habr un quirfano disponible y listo en caso de necesitar una cesrea de emergencia. Un mdico y personal de quirfano estarn disponibles en todo momento durante el [place] de parto, para realizar una cesrea en caso de ser necesario. Habr un anestesista disponible en caso de necesitar una cesrea de emergencia. La nursery est lista y cuenta con personal especializado y el equipo disponible para cuidar al beb en caso de emergencia. BENEFICIOS DEL PARTO VAGINAL: Permanencia ms breve en el hospital. Prevencin de los riesgos asociados con el parto por cesrea, por ejemplo: Complicaciones quirrgicas, como apertura o hernia de la incisin. Lesiones en otros rganos. Cristy Hilts. Esto puede ocurrir si aparece una infeccin despus de la ciruga. Tambin puede ocurrir como reaccin a los medicamentos administrados para  adormecerla durante la [place]. Menos prdida de sangre y menos probabilidad de necesitar una transfusin sangunea. Menor riesgo de cogulos sanguneos e infeccin. Tiempo ms corto de recuperacin. Menor riesgo de remocin del tero (histerectoma). Menor riesgo de que la placenta cubra parcial o completamente la abertura del tero (placenta previa) en embarazos futuros. Menos riesgos en el [place] de parto y [place] futuros. RIESGOS Ruptura del tero. Esto ocurre en menos del 1% de los partos vaginales. El riesgo de que eso suceda es mayor si: Se toman medidas para iniciar el proceso del [place] de parto (inducir Doctor, hospital) o Educational psychologist o intensificar las contracciones (aumentar el trabajo de [place]). Se usan medicamentos para ablandar (madurar) el cuello del tero. Es necesario extraer el tero (histerectoma) si se rompe. No debe llevarse a cabo si: La cesrea previa se realiz con una incisin vertical (clsica) o con forma de T, o usted no sabe cul de [employer] han practicado. Ha sufrido ruptura del tero. Ha tenido ciertos tipos de Leisure centre manager en el tero, como la extirpacin de fibromas uterinos.  Pregntele a su mdico sobre otros tipos de cirugas que le impiden tener un parto vaginal. Tiene ciertos problemas mdicos o relacionados con el parto (obsttricos). El beb est en problemas. Tuvo dos cesreas previas y ningn parto vaginal. OTRAS COSAS QUE DEBE SABER: La anestesia peridural es segura. Es seguro dar vuelta al beb si se encuentra de nalgas (intentar una versin ceflica externa). Es seguro intentarlo en caso de mellizos. El parto vaginal puede no ser apropiado si el beb pesa 8,8lb (4kg) o ms. Sin embargo, las predicciones de [place] no son siempre exactas y no deben ser lo nico a tenerse en cuenta para decidir si el parto vaginal es lo indicado para usted. Hay aumento en el porcentaje de fracasos si el intervalo entre la cesrea y el parto vaginal es de menos de 19 meses. Su  mdico puede aconsejarle no tener un parto vaginal si tiene preeclampsia (hipertensin, protena en la orina e hinchazn en la cara y las extremidades). El parto vaginal suele ser exitoso si ya tuvo un parto vaginal previamente. Tambin suele ser exitoso cuando el trabajo de parto comienza espontneamente antes de la fecha. El parto vaginal despus de Elmon Else es similar a un parto espontneo vaginal normal.   Esta informacin no tiene Marine scientist el consejo del mdico. Asegrese de hacerle al mdico cualquier pregunta que tenga.   Document Released: 01/17/2008 Document Revised: 05/21/2013 Elsevier Interactive Patient Education [employer].

## 2015-05-24 NOTE — Progress Notes (Signed)
Used interpreter Lockie Mola. Urinalysis shows moderate leukocytes.

## 2015-06-07 ENCOUNTER — Ambulatory Visit (INDEPENDENT_AMBULATORY_CARE_PROVIDER_SITE_OTHER): Payer: Self-pay | Admitting: Obstetrics and Gynecology

## 2015-06-07 ENCOUNTER — Encounter: Payer: Self-pay | Admitting: *Deleted

## 2015-06-07 VITALS — BP 133/64 | HR 91 | Temp 98.6°F | Wt 222.2 lb

## 2015-06-07 DIAGNOSIS — O24419 Gestational diabetes mellitus in pregnancy, unspecified control: Secondary | ICD-10-CM

## 2015-06-07 DIAGNOSIS — E669 Obesity, unspecified: Secondary | ICD-10-CM

## 2015-06-07 DIAGNOSIS — O09522 Supervision of elderly multigravida, second trimester: Secondary | ICD-10-CM

## 2015-06-07 DIAGNOSIS — Z8619 Personal history of other infectious and parasitic diseases: Secondary | ICD-10-CM

## 2015-06-07 DIAGNOSIS — O3662X0 Maternal care for excessive fetal growth, second trimester, not applicable or unspecified: Secondary | ICD-10-CM

## 2015-06-07 DIAGNOSIS — O99212 Obesity complicating pregnancy, second trimester: Secondary | ICD-10-CM

## 2015-06-07 DIAGNOSIS — O99012 Anemia complicating pregnancy, second trimester: Secondary | ICD-10-CM

## 2015-06-07 DIAGNOSIS — Z98891 History of uterine scar from previous surgery: Secondary | ICD-10-CM

## 2015-06-07 DIAGNOSIS — IMO0002 Reserved for concepts with insufficient information to code with codable children: Secondary | ICD-10-CM

## 2015-06-07 DIAGNOSIS — N898 Other specified noninflammatory disorders of vagina: Secondary | ICD-10-CM

## 2015-06-07 DIAGNOSIS — Z789 Other specified health status: Secondary | ICD-10-CM

## 2015-06-07 DIAGNOSIS — Z23 Encounter for immunization: Secondary | ICD-10-CM

## 2015-06-07 DIAGNOSIS — O0990 Supervision of high risk pregnancy, unspecified, unspecified trimester: Secondary | ICD-10-CM

## 2015-06-07 DIAGNOSIS — D649 Anemia, unspecified: Secondary | ICD-10-CM

## 2015-06-07 LAB — POCT URINALYSIS DIP (DEVICE)
BILIRUBIN URINE: NEGATIVE
Glucose, UA: NEGATIVE mg/dL
HGB URINE DIPSTICK: NEGATIVE
Ketones, ur: NEGATIVE mg/dL
NITRITE: NEGATIVE
PH: 7 (ref 5.0–8.0)
Protein, ur: NEGATIVE mg/dL
SPECIFIC GRAVITY, URINE: 1.02 (ref 1.005–1.030)
Urobilinogen, UA: 1 mg/dL (ref 0.0–1.0)

## 2015-06-07 LAB — POCT URINALYSIS DIPSTICK
Glucose, UA: NEGATIVE
NITRITE UA: NEGATIVE
Protein, UA: 7

## 2015-06-07 MED ORDER — GLYBURIDE 2.5 MG PO TABS: ORAL_TABLET | ORAL | Status: DC

## 2015-06-07 MED ORDER — ASPIRIN EC 81 MG PO TBEC: 81.0000 mg | DELAYED_RELEASE_TABLET | Freq: Every day | ORAL | Status: DC

## 2015-06-07 MED ORDER — GLUCOSE BLOOD VI STRP: ORAL_STRIP | Status: DC

## 2015-06-07 MED ORDER — ACCU-CHEK FASTCLIX LANCETS MISC: 1.0000 [IU] | Freq: Four times a day (QID) | Status: DC

## 2015-06-07 NOTE — Addendum Note (Signed)
Addended by: Phillip Heal, Alessander Sikorski A on: 06/07/2015 11:35 AM   Modules accepted: Orders

## 2015-06-07 NOTE — Addendum Note (Signed)
Addended by: Phillip Heal, DEMETRICE A on: 06/07/2015 11:26 AM   Modules accepted: Orders

## 2015-06-07 NOTE — Progress Notes (Signed)
Subjective:  Morgan Ramsey is a 39 y.o. [id number] at [redacted]w[redacted]d being seen today for ongoing prenatal care.  Patient reports no complaints.  Contractions: Not present.  Vag. Bleeding: None. Movement: Present. Denies leaking of fluid.   The following portions of the patient's history were reviewed and updated as appropriate: allergies, current medications, past family history, past medical history, past social history, past surgical history and problem list. Problem list updated.  Objective:   Filed Vitals:   06/07/15 1053  BP: 133/64  Pulse: 91  Temp: 98.6 F (37 C)  Weight: 222 lb 3.2 oz (100.789 kg)    Fetal Status: Fetal Heart Rate (bpm): 149 Fundal Height: 26 cm Movement: Present     General:  Alert, oriented and cooperative. Patient is in no acute distress.  Skin: Skin is warm and dry. No rash noted.   Cardiovascular: Normal heart rate noted  Respiratory: Normal respiratory effort, no problems with respiration noted  Abdomen: Soft, gravid, appropriate for gestational age. Pain/Pressure: Present     Pelvic: Vag. Bleeding: None     Cervical exam deferred        Extremities: Normal range of motion.     Mental Status: Normal mood and affect. Normal behavior. Normal judgment and thought content.   Urinalysis:      Assessment and Plan:  Pregnancy: [id number] at [redacted]w[redacted]d  # AMA - ordering growth scan, needs serially, delivery by 72, antenatal testing @ 36  # A2GDM - glucose appropriate, continue glyburide bid  # Pregnancy - cbc, hiv, rpr today - tdap today  # HSV - ppx at 36 weeks  # TOLAC - consent signed today Preterm labor symptoms and general obstetric precautions including but not limited to vaginal bleeding, contractions, leaking of fluid and fetal movement were reviewed in detail with the patient. Please refer to After Visit Summary for other counseling recommendations.  Return in about 2 weeks (around 06/21/2015).   Gwynne Edinger, MD

## 2015-06-08 LAB — CBC
HEMATOCRIT: 33.7 % — AB (ref 36.0–46.0)
Hemoglobin: 11.1 g/dL — ABNORMAL LOW (ref 12.0–15.0)
MCH: 22.7 pg — ABNORMAL LOW (ref 26.0–34.0)
MCHC: 32.9 g/dL (ref 30.0–36.0)
MCV: 69.1 fL — AB (ref 78.0–100.0)
MPV: 9.5 fL (ref 8.6–12.4)
Platelets: 266 10*3/uL (ref 150–400)
RBC: 4.88 MIL/uL (ref 3.87–5.11)
RDW: 20.8 % — ABNORMAL HIGH (ref 11.5–15.5)
WBC: 9.5 10*3/uL (ref 4.0–10.5)

## 2015-06-08 LAB — HIV ANTIBODY (ROUTINE TESTING W REFLEX): HIV 1&2 Ab, 4th Generation: NONREACTIVE

## 2015-06-08 LAB — RPR

## 2015-06-14 ENCOUNTER — Ambulatory Visit (HOSPITAL_COMMUNITY): Payer: Self-pay

## 2015-06-15 ENCOUNTER — Ambulatory Visit (HOSPITAL_COMMUNITY)
Admission: RE | Admit: 2015-06-15 | Discharge: 2015-06-15 | Disposition: A | Discharge status: Home / Self Care | Payer: Self-pay | Source: Ambulatory Visit | Attending: Obstetrics and Gynecology | Admitting: Obstetrics and Gynecology

## 2015-06-15 ENCOUNTER — Other Ambulatory Visit (HOSPITAL_COMMUNITY): Payer: Self-pay | Admitting: *Deleted

## 2015-06-15 ENCOUNTER — Encounter (HOSPITAL_COMMUNITY): Payer: Self-pay

## 2015-06-15 DIAGNOSIS — O24419 Gestational diabetes mellitus in pregnancy, unspecified control: Secondary | ICD-10-CM | POA: Insufficient documentation

## 2015-06-15 DIAGNOSIS — O0990 Supervision of high risk pregnancy, unspecified, unspecified trimester: Secondary | ICD-10-CM

## 2015-06-15 DIAGNOSIS — O34219 Maternal care for unspecified type scar from previous cesarean delivery: Secondary | ICD-10-CM | POA: Insufficient documentation

## 2015-06-15 DIAGNOSIS — Z3A28 28 weeks gestation of pregnancy: Secondary | ICD-10-CM | POA: Insufficient documentation

## 2015-06-15 DIAGNOSIS — O09522 Supervision of elderly multigravida, second trimester: Secondary | ICD-10-CM | POA: Insufficient documentation

## 2015-06-21 ENCOUNTER — Ambulatory Visit (INDEPENDENT_AMBULATORY_CARE_PROVIDER_SITE_OTHER): Payer: Self-pay | Admitting: Medical

## 2015-06-21 VITALS — BP 122/65 | HR 85 | Temp 99.1°F | Wt 221.8 lb

## 2015-06-21 DIAGNOSIS — O09523 Supervision of elderly multigravida, third trimester: Secondary | ICD-10-CM

## 2015-06-21 DIAGNOSIS — Z98891 History of uterine scar from previous surgery: Secondary | ICD-10-CM

## 2015-06-21 DIAGNOSIS — IMO0002 Reserved for concepts with insufficient information to code with codable children: Secondary | ICD-10-CM

## 2015-06-21 DIAGNOSIS — O0993 Supervision of high risk pregnancy, unspecified, third trimester: Secondary | ICD-10-CM

## 2015-06-21 DIAGNOSIS — O3663X0 Maternal care for excessive fetal growth, third trimester, not applicable or unspecified: Secondary | ICD-10-CM

## 2015-06-21 DIAGNOSIS — O24419 Gestational diabetes mellitus in pregnancy, unspecified control: Secondary | ICD-10-CM

## 2015-06-21 LAB — POCT URINALYSIS DIP (DEVICE)
GLUCOSE, UA: NEGATIVE mg/dL
NITRITE: NEGATIVE
PH: 5.5 (ref 5.0–8.0)
PROTEIN: 30 mg/dL — AB
Specific Gravity, Urine: >=1.03 (ref 1.005–1.030)
Urobilinogen, UA: 0.2 mg/dL (ref 0.0–1.0)

## 2015-06-21 MED ORDER — GLYBURIDE 2.5 MG PO TABS: ORAL_TABLET | ORAL | Status: DC

## 2015-06-21 NOTE — Patient Instructions (Signed)
Tercer trimestre de Media planner (Third Trimester of Pregnancy) El tercer trimestre comprende desde la [id number] hasta la [id number], es decir, desde el mes7 hasta el mes9. El tercer trimestre es un perodo en el que el feto crece rpidamente. Hacia el final del noveno mes, el feto mide alrededor de 20pulgadas (45cm) de largo y pesa entre 6 y 63 libras (2,700 y 3,500kg).  CAMBIOS EN EL ORGANISMO Su organismo atraviesa por muchos cambios durante el [place], y estos varan de Ardelia Mems mujer a Theatre manager.   Seguir [employer]. Es de esperar que aumente entre 25 y 35libras (24 y 16kg) hacia el final del Media planner.  Podrn aparecer las primeras [employer] caderas, el abdomen y las [place].  Puede tener necesidad de Garment/textile technologist con ms frecuencia porque el feto baja hacia la pelvis y ejerce presin sobre la vejiga.  Debido al Glennis Brink podr sentir Victorio Palm estomacal con frecuencia.  Puede estar estreida, ya que ciertas hormonas enlentecen los movimientos de los msculos que [employer] desechos a travs de los intestinos.  Pueden aparecer hemorroides o abultarse e hincharse las venas (venas varicosas).  Puede sentir dolor plvico debido al [employer] y a que las hormonas del Scientist, research (life sciences) las articulaciones entre los huesos de la pelvis. El dolor de espalda puede ser consecuencia de la sobrecarga de los msculos que soportan la [place].  Tal vez haya cambios en el cabello que pueden incluir su engrosamiento, crecimiento rpido y cambios en la textura. Adems, a algunas mujeres se les cae el cabello durante o despus del embarazo, o tienen el cabello seco o fino. Lo ms probable es que el cabello se le normalice despus del nacimiento del beb.  Las [employer] seguirn creciendo y Teaching laboratory technician. A veces, puede haber una secrecin amarilla de las mamas llamada calostro.  El ombligo puede salir hacia afuera.  Puede sentir que le falta el aire debido a que se expande el tero.  Puede notar que el feto  "baja" o lo siente ms bajo, en el abdomen.  Puede tener una prdida de secrecin mucosa con sangre. Esto suele ocurrir en el trmino de unos pocos das a una semana antes de que comience el [place].  El cuello del tero se vuelve delgado y blando (se borra) cerca de la fecha de [place]. QU DEBE ESPERAR EN LOS EXMENES PRENATALES  Le harn exmenes prenatales cada 2semanas hasta la semana36. A partir de ese momento le harn exmenes semanales. Durante una visita prenatal de rutina:  La pesarn para asegurarse de que usted y el feto estn creciendo normalmente.  Le tomarn la presin arterial.  Le medirn el abdomen para controlar el desarrollo del beb.  Se escucharn los latidos cardacos fetales.  Se evaluarn los resultados de los estudios solicitados en visitas anteriores.  Le revisarn el cuello del tero cuando est prxima la fecha de parto para controlar si este se ha borrado. Alrededor de la semana36, el mdico le revisar el cuello del tero. Al mismo tiempo, realizar un anlisis de las secreciones del tejido vaginal. Este examen es para determinar si hay un tipo de bacteria, estreptococo Grupo B. El mdico le explicar esto con ms detalle. El mdico puede preguntarle lo siguiente:  Cmo le gustara que fuera el [place].  Cmo se siente.  Si siente los movimientos del beb.  Si ha tenido sntomas anormales, como prdida de lquido, [place], dolores de cabeza intensos o clicos abdominales.  Si est consumiendo algn producto que contenga tabaco, como cigarrillos, tabaco  de Higher education careers adviser y Psychologist, sport and exercise.  Si tiene [employer]. Otros exmenes o estudios de deteccin que pueden realizarse durante el tercer trimestre incluyen lo siguiente:  Anlisis de sangre para controlar los niveles de hierro (anemia).  Controles fetales para determinar su salud, nivel de [place] y Mining engineer. Si tiene [place] enfermedad o hay problemas durante el embarazo, le harn  estudios.  Prueba del VIH (virus de inmunodeficiencia humana). Si corre Electronics engineer, pueden realizarle una prueba de deteccin del VIH durante el tercer trimestre del embarazo. FALSO TRABAJO DE PARTO Es posible que sienta contracciones leves e irregulares que finalmente desaparecen. Se llaman contracciones de Braxton Hicks o falso trabajo de [place]. Las Yahoo pueden durar horas, das o incluso semanas, antes de que el verdadero trabajo de parto se inicie. Si las contracciones ocurren a intervalos regulares, se intensifican o se hacen dolorosas, lo mejor es que la revise el mdico.  SIGNOS DE TRABAJO DE PARTO   Clicos de tipo menstrual.  Contracciones cada 48mnutos o menos.  Contracciones que comienzan en la parte superior del tero y se extienden hacia abajo, a la zona inferior del abdomen y la espalda.  Sensacin de mayor presin en la pelvis o dolor de espalda.  Una secrecin de mucosidad acuosa o con sangre que sale de la vagina. Si tiene alguno de estos signos antes de la [id number] eMedia planner llame a su mdico de inmediato. Debe concurrir al hospital para que la controlen inmediatamente. INSTRUCCIONES PARA EL CUIDADO EN EL HOGAR   Evite fumar, consumir hierbas, beber alcohol y tomar frmacos que no le hayan recetado. Estas sustancias qumicas afectan la formacin y el desarrollo del beb.  No consuma ningn producto que contenga tabaco, lo que incluye cigarrillos, tabaco de mHigher education careers advisery cPsychologist, sport and exercise Si necesita ayuda para dejar de fumar, consulte al mMeadWestvaco Puede recibir asesoramiento y otro tipo de recursos para dejar de fumar.  SHardinsburgmdico en relacin con el uso de medicamentos. Durante el embarazo, hay medicamentos que son seguros de tomar y otros que no.  Haga ejercicio solamente como se lo haya indicado el mdico. Sentir clicos uterinos es un buen signo para dAmbulance personactividad fsica.  Contine comiendo alimentos sanos con  regularidad.  Use un sostn que le brinde buen soporte si le dNordstrom  No se d baos de inmersin en agua caliente, baos turcos ni saunas.  Use el cinturn de seguridad en todo momento mientras conduce.  No coma carne cruda ni queso sin cocinar; evite el contacto con las bandejas sanitarias de los gatos y la tierra que estos animales usan. Estos elementos contienen grmenes que pueden causar defectos congnitos en el beb.  TSebastian  Tome entre 1500 y 20017mde calcio diariamente comenzando en la [id number] embarazo haConvoy Si est estreida, pruebe un laxante suave (si el mdico lo autoriza). Consuma ms alimentos ricos en fibra, como vegetales y frutas frescos y cePsychologist, prison and probation servicesBeba gran cantidad de lquido para mantener la orina de tono claro o color amarillo plido.  Dese baos de asiento con agua tibia para alBest boy las molestias causadas por las hemorroides. Use una crema para las hemorroides si el mdico la autoriza.  Si tiene venas varicosas, use medias de descanso. Eleve los pies durante 1518mtos, 3 o 4veces por da. Limite el consumo de sal en su dieta.  Evite levantar objetos pesados, use zapatos de tacones bajos y manWestern SaharaDescanse  con las piernas elevadas si tiene calambres o dolor de cintura.  Visite a su dentista si no lo ha Quarry manager. Use un cepillo de dientes blando para higienizarse los dientes y psese el hilo dental con suavidad.  Puede seguir [employer], a menos que el mdico le indique lo contrario.  No haga viajes largos excepto que sea absolutamente necesario y solo con la autorizacin del [place] clases prenatales para Development worker, international aid, Psychologist, prison and probation services y hacer preguntas sobre el [place] de parto y [place].  Haga un ensayo de la partida al hospital.  Prepare el bolso que llevar al hospital.  Prepare la habitacin del beb.  Concurra a todas  las visitas prenatales segn las indicaciones de su mdico. SOLICITE ATENCIN MDICA SI:  No est segura de que est en trabajo de parto o de que ha roto la bolsa de las aguas.  Tiene mareos.  Siente clicos leves, presin en la pelvis o dolor persistente en el abdomen.  Tiene nuseas, vmitos o diarrea persistentes.  Margette Fast secrecin vaginal con mal olor.  Siente dolor al [employer]. SOLICITE ATENCIN MDICA DE INMEDIATO SI:   Tiene fiebre.  Tiene una prdida de lquido por la vagina.  Tiene sangrado o pequeas prdidas vaginales.  Siente dolor intenso o clicos en el abdomen.  Sube o baja de peso rpidamente.  Tiene dificultad para respirar y siente dolor de pecho.  Sbitamente se le hinchan mucho el rostro, las [place], los tobillos, los pies o las piernas.  No ha sentido los movimientos del beb durante Leone Brand.  Siente un dolor de cabeza intenso que no se alivia con medicamentos.  Su visin se modifica.   Esta informacin no tiene Marine scientist el consejo del mdico. Asegrese de hacerle al mdico cualquier pregunta que tenga.   Document Released: 05/10/2005 Document Revised: 08/21/2014 Elsevier Interactive Patient Education [employer].

## 2015-06-21 NOTE — Progress Notes (Signed)
C/o Pain Left epigastric/rib area.

## 2015-06-21 NOTE — Progress Notes (Signed)
Subjective:  Morgan Ramsey is a 39 y.o. [id number] at [redacted]w[redacted]d being seen today for ongoing prenatal care.  Patient reports backache. Patient reports intermittent left lateral thoracic back pain. She states worse with certain movements.  Contractions: Not present.  Vag. Bleeding: None. Movement: Present. Denies leaking of fluid.   The following portions of the patient's history were reviewed and updated as appropriate: allergies, current medications, past family history, past medical history, past social history, past surgical history and problem list. Problem list updated.  Objective:   Filed Vitals:   06/21/15 1039  BP: 122/65  Pulse: 85  Temp: 99.1 F (37.3 C)  Weight: 221 lb 12.8 oz (100.608 kg)    Fetal Status: Fetal Heart Rate (bpm): 150 Fundal Height: 29 cm Movement: Present     General:  Alert, oriented and cooperative. Patient is in no acute distress.  Skin: Skin is warm and dry. No rash noted.   Cardiovascular: Normal heart rate noted  Respiratory: Normal respiratory effort, no problems with respiration noted  Abdomen: Soft, gravid, appropriate for gestational age. Pain/Pressure: Present     Pelvic: Vag. Bleeding: None Vag D/C Character: White   Cervical exam deferred        Extremities: Normal range of motion.  Edema: None  Mental Status: Normal mood and affect. Normal behavior. Normal judgment and thought content.   Urinalysis: Urine Protein: 1+ Urine Glucose: Negative  Assessment and Plan:  Pregnancy: [id number] at [redacted]w[redacted]d  1. Gestational diabetes mellitus, antepartum Glucose is not well-controlled on current dose of Glyburide. Will increase dose:  - glyBURIDE (DIABETA) 2.5 MG tablet; Take 2 tablets q am before breakfast and 1.5 tablets at 9 pm  Dispense: 105 tablet; Refill: 3  2. AMA (advanced maternal age) multigravida 12+, third trimester Growth scan scheduled 07/13/15 with MFM Patient will start antenatal testing at 22 weeks  3. LGA (large for gestational age)  fetus  85. Previous cesarean section TOLAC forms signed 06/07/15  Preterm labor symptoms and general obstetric precautions including but not limited to vaginal bleeding, contractions, leaking of fluid and fetal movement were reviewed in detail with the patient. Please refer to After Visit Summary for other counseling recommendations.  Return in about 1 week (around 06/28/2015) for Routine prenatal care, start NST 2 x weekly next visit.   Luvenia Redden, PA-C

## 2015-06-28 ENCOUNTER — Other Ambulatory Visit: Payer: Self-pay

## 2015-07-12 ENCOUNTER — Ambulatory Visit (INDEPENDENT_AMBULATORY_CARE_PROVIDER_SITE_OTHER): Payer: Self-pay | Admitting: Obstetrics and Gynecology

## 2015-07-12 ENCOUNTER — Encounter: Payer: Self-pay | Admitting: Obstetrics and Gynecology

## 2015-07-12 VITALS — BP 103/75 | HR 97 | Wt 227.0 lb

## 2015-07-12 DIAGNOSIS — Z789 Other specified health status: Secondary | ICD-10-CM

## 2015-07-12 DIAGNOSIS — O0993 Supervision of high risk pregnancy, unspecified, third trimester: Secondary | ICD-10-CM

## 2015-07-12 DIAGNOSIS — Z8619 Personal history of other infectious and parasitic diseases: Secondary | ICD-10-CM

## 2015-07-12 DIAGNOSIS — O09523 Supervision of elderly multigravida, third trimester: Secondary | ICD-10-CM

## 2015-07-12 DIAGNOSIS — O24419 Gestational diabetes mellitus in pregnancy, unspecified control: Secondary | ICD-10-CM

## 2015-07-12 DIAGNOSIS — Z98891 History of uterine scar from previous surgery: Secondary | ICD-10-CM

## 2015-07-12 LAB — POCT URINALYSIS DIP (DEVICE)
Bilirubin Urine: NEGATIVE
Glucose, UA: NEGATIVE mg/dL
HGB URINE DIPSTICK: NEGATIVE
Ketones, ur: NEGATIVE mg/dL
Leukocytes, UA: NEGATIVE
Nitrite: NEGATIVE
PROTEIN: NEGATIVE mg/dL
SPECIFIC GRAVITY, URINE: 1.025 (ref 1.005–1.030)
UROBILINOGEN UA: 0.2 mg/dL (ref 0.0–1.0)
pH: 5.5 (ref 5.0–8.0)

## 2015-07-12 NOTE — Progress Notes (Signed)
Interpreter Zenda Alpers present for encounter. Breastfeeding tip of the week reviewed. [place] scheduled 12/1.

## 2015-07-12 NOTE — Progress Notes (Signed)
Subjective:  Morgan Ramsey is a 39 y.o. 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  She is currently monitored for the following issues for this high-risk pregnancy and has Language barrier; AMA (advanced maternal age) multigravida 35+; H/O herpes simplex type 2 infection; Previous cesarean section; LGA (large for gestational age) fetus; Obesity; Anemia; Gestational diabetes mellitus, antepartum; and Supervision of high-risk pregnancy on her problem list.  Patient reports no complaints.  Contractions: Not present. Vag. Bleeding: None.  Movement: Present. Denies leaking of fluid.   The following portions of the patient's history were reviewed and updated as appropriate: allergies, current medications, past family history, past medical history, past social history, past surgical history and problem list. Problem list updated.  Objective:   Filed Vitals:   07/12/15 0952  BP: 103/75  Pulse: 97  Weight: 227 lb (102.967 kg)    Fetal Status: Fetal Heart Rate (bpm): NST   Movement: Present     General:  Alert, oriented and cooperative. Patient is in no acute distress.  Skin: Skin is warm and dry. No rash noted.   Cardiovascular: Normal heart rate noted  Respiratory: Normal respiratory effort, no problems with respiration noted  Abdomen: Soft, gravid, appropriate for gestational age. Pain/Pressure: Present     Pelvic: Vag. Bleeding: None     Cervical exam deferred        Extremities: Normal range of motion.  Edema: None  Mental Status: Normal mood and affect. Normal behavior. Normal judgment and thought content.   Urinalysis: Urine Protein: Negative Urine Glucose: Negative  Assessment and Plan:  Pregnancy: [id number] at [redacted]w[redacted]d  1. Gestational diabetes mellitus, antepartum CBGs reviewed. Fasting 78-95 2 hr pp 107-155 50 % within rage. Patient admits to not always adhering to the diet No change in current glyburide regiment Diet reviewed with the patient - Fetal  nonstress test- reviewed and reactive  2. Previous cesarean section   3. Supervision of high-risk pregnancy, third trimester   4. Language barrier Spanish interpreter present  5. H/O herpes simplex type 2 infection Will start prophylaxis at 34 weeks  6. AMA (advanced maternal age) multigravida 33+, third trimester   Preterm labor symptoms and general obstetric precautions including but not limited to vaginal bleeding, contractions, leaking of fluid and fetal movement were reviewed in detail with the patient. Please refer to After Visit Summary for other counseling recommendations.  Return in about 3 days (around 07/15/2015) for 2x/wk as scheduled.   Mora Bellman, MD

## 2015-07-13 ENCOUNTER — Ambulatory Visit (HOSPITAL_COMMUNITY): Payer: Self-pay

## 2015-07-15 ENCOUNTER — Ambulatory Visit (HOSPITAL_COMMUNITY)
Admission: RE | Admit: 2015-07-15 | Discharge: 2015-07-15 | Disposition: A | Discharge status: Home / Self Care | Payer: Self-pay | Source: Ambulatory Visit | Attending: Maternal and Fetal Medicine | Admitting: Maternal and Fetal Medicine

## 2015-07-15 ENCOUNTER — Ambulatory Visit (INDEPENDENT_AMBULATORY_CARE_PROVIDER_SITE_OTHER): Payer: Self-pay | Admitting: General Practice

## 2015-07-15 VITALS — BP 109/61 | HR 91

## 2015-07-15 VITALS — BP 126/57 | HR 89 | Wt 226.6 lb

## 2015-07-15 DIAGNOSIS — O99213 Obesity complicating pregnancy, third trimester: Secondary | ICD-10-CM | POA: Insufficient documentation

## 2015-07-15 DIAGNOSIS — O09523 Supervision of elderly multigravida, third trimester: Secondary | ICD-10-CM | POA: Insufficient documentation

## 2015-07-15 DIAGNOSIS — O34219 Maternal care for unspecified type scar from previous cesarean delivery: Secondary | ICD-10-CM | POA: Insufficient documentation

## 2015-07-15 DIAGNOSIS — O24414 Gestational diabetes mellitus in pregnancy, insulin controlled: Secondary | ICD-10-CM

## 2015-07-15 DIAGNOSIS — O24419 Gestational diabetes mellitus in pregnancy, unspecified control: Secondary | ICD-10-CM | POA: Insufficient documentation

## 2015-07-15 DIAGNOSIS — Z3A32 32 weeks gestation of pregnancy: Secondary | ICD-10-CM | POA: Insufficient documentation

## 2015-07-15 NOTE — Progress Notes (Signed)
NST reviewed and reactive.  

## 2015-07-19 ENCOUNTER — Encounter: Payer: Self-pay | Admitting: Obstetrics and Gynecology

## 2015-07-19 ENCOUNTER — Ambulatory Visit (INDEPENDENT_AMBULATORY_CARE_PROVIDER_SITE_OTHER): Payer: Self-pay | Admitting: Obstetrics and Gynecology

## 2015-07-19 VITALS — BP 107/66 | HR 84 | Temp 98.5°F | Wt 228.0 lb

## 2015-07-19 DIAGNOSIS — IMO0002 Reserved for concepts with insufficient information to code with codable children: Secondary | ICD-10-CM

## 2015-07-19 DIAGNOSIS — O09523 Supervision of elderly multigravida, third trimester: Secondary | ICD-10-CM

## 2015-07-19 DIAGNOSIS — Z789 Other specified health status: Secondary | ICD-10-CM

## 2015-07-19 DIAGNOSIS — Z98891 History of uterine scar from previous surgery: Secondary | ICD-10-CM

## 2015-07-19 DIAGNOSIS — O24419 Gestational diabetes mellitus in pregnancy, unspecified control: Secondary | ICD-10-CM

## 2015-07-19 DIAGNOSIS — O0993 Supervision of high risk pregnancy, unspecified, third trimester: Secondary | ICD-10-CM

## 2015-07-19 LAB — POCT URINALYSIS DIP (DEVICE)
Bilirubin Urine: NEGATIVE
Glucose, UA: NEGATIVE mg/dL
HGB URINE DIPSTICK: NEGATIVE
Ketones, ur: NEGATIVE mg/dL
Leukocytes, UA: NEGATIVE
NITRITE: NEGATIVE
PH: 6 (ref 5.0–8.0)
PROTEIN: NEGATIVE mg/dL
Specific Gravity, Urine: 1.02 (ref 1.005–1.030)
UROBILINOGEN UA: 0.2 mg/dL (ref 0.0–1.0)

## 2015-07-19 NOTE — Progress Notes (Signed)
Subjective:  Morgan Ramsey is a 39 y.o. [id number] at [redacted]w[redacted]d being seen today for ongoing prenatal care.  She is currently monitored for the following issues for this high-risk pregnancy and has Language barrier; AMA (advanced maternal age) multigravida 35+; H/O herpes simplex type 2 infection; Previous cesarean section; LGA (large for gestational age) fetus; Obesity; Anemia; Gestational diabetes mellitus, antepartum; and Supervision of high-risk pregnancy on her problem list.  Patient reports no complaints.  Contractions: Not present. Vag. Bleeding: None.  Movement: Present. Denies leaking of fluid.   The following portions of the patient's history were reviewed and updated as appropriate: allergies, current medications, past family history, past medical history, past social history, past surgical history and problem list. Problem list updated.  Objective:   Filed Vitals:   07/19/15 1055  BP: 107/66  Pulse: 84  Temp: 98.5 F (36.9 C)  Weight: 228 lb (103.42 kg)    Fetal Status: Fetal Heart Rate (bpm): 141   Movement: Present     General:  Alert, oriented and cooperative. Patient is in no acute distress.  Skin: Skin is warm and dry. No rash noted.   Cardiovascular: Normal heart rate noted  Respiratory: Normal respiratory effort, no problems with respiration noted  Abdomen: Soft, gravid, appropriate for gestational age.       Pelvic: Vag. Bleeding: None     Cervical exam deferred        Extremities: Normal range of motion.  Edema: None  Mental Status: Normal mood and affect. Normal behavior. Normal judgment and thought content.   Urinalysis:      Assessment and Plan:  Pregnancy: [id number] at [redacted]w[redacted]d  1. AMA (advanced maternal age) multigravida 11+, third trimester   2. Previous cesarean section Patient desires TOLAC. Consent signed  3. Supervision of high-risk pregnancy, third trimester   4. LGA (large for gestational age) fetus   20. Language barrier   6. Gestational  diabetes mellitus, antepartum CBGs reviewed and greater than 50% within range Continue current glyburide dosage Reviewed adhering to the diet 07/15/2015 EFW 5lb NST reviewed and reactive  Preterm labor symptoms and general obstetric precautions including but not limited to vaginal bleeding, contractions, leaking of fluid and fetal movement were reviewed in detail with the patient. Please refer to After Visit Summary for other counseling recommendations.  Return in about 1 week (around 07/26/2015).   Mora Bellman, MD

## 2015-07-19 NOTE — Progress Notes (Signed)
Spanish Interpreter Lockie Mola Educated pt on Rooming In; info given

## 2015-07-23 ENCOUNTER — Ambulatory Visit (INDEPENDENT_AMBULATORY_CARE_PROVIDER_SITE_OTHER): Payer: Self-pay | Admitting: *Deleted

## 2015-07-23 DIAGNOSIS — Z36 Encounter for antenatal screening of mother: Secondary | ICD-10-CM

## 2015-07-23 DIAGNOSIS — O24419 Gestational diabetes mellitus in pregnancy, unspecified control: Secondary | ICD-10-CM

## 2015-07-23 NOTE — Progress Notes (Signed)
NST performed today was reviewed and was found to be reactive.  AFI normal at 14.5 cm.  Continue recommended antenatal testing and prenatal care.

## 2015-07-26 ENCOUNTER — Encounter: Payer: Self-pay | Admitting: Obstetrics and Gynecology

## 2015-07-26 ENCOUNTER — Ambulatory Visit (INDEPENDENT_AMBULATORY_CARE_PROVIDER_SITE_OTHER): Payer: Self-pay | Admitting: Obstetrics and Gynecology

## 2015-07-26 VITALS — BP 124/78 | HR 98 | Temp 98.5°F | Wt 226.6 lb

## 2015-07-26 DIAGNOSIS — Z98891 History of uterine scar from previous surgery: Secondary | ICD-10-CM

## 2015-07-26 DIAGNOSIS — O09523 Supervision of elderly multigravida, third trimester: Secondary | ICD-10-CM

## 2015-07-26 DIAGNOSIS — O24419 Gestational diabetes mellitus in pregnancy, unspecified control: Secondary | ICD-10-CM

## 2015-07-26 DIAGNOSIS — O0993 Supervision of high risk pregnancy, unspecified, third trimester: Secondary | ICD-10-CM

## 2015-07-26 LAB — POCT URINALYSIS DIP (DEVICE)
BILIRUBIN URINE: NEGATIVE
Glucose, UA: NEGATIVE mg/dL
HGB URINE DIPSTICK: NEGATIVE
KETONES UR: NEGATIVE mg/dL
NITRITE: NEGATIVE
Protein, ur: NEGATIVE mg/dL
SPECIFIC GRAVITY, URINE: 1.02 (ref 1.005–1.030)
Urobilinogen, UA: 0.2 mg/dL (ref 0.0–1.0)
pH: 7 (ref 5.0–8.0)

## 2015-07-26 MED ORDER — PRENATAL MULTIVITAMIN CH: 1.0000 | ORAL_TABLET | Freq: Every day | ORAL | Status: DC

## 2015-07-26 MED ORDER — VALACYCLOVIR HCL 1 G PO TABS: 1000.0000 mg | ORAL_TABLET | Freq: Every day | ORAL | Status: DC

## 2015-07-26 NOTE — Progress Notes (Signed)
Pt is in need of strips for testing and prescription for prenatal vit.

## 2015-07-26 NOTE — Progress Notes (Signed)
Subjective:  Morgan Ramsey is a 39 y.o. 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  She is currently monitored for the following issues for this high-risk pregnancy and has Language barrier; AMA (advanced maternal age) multigravida 35+; H/O herpes simplex type 2 infection; Previous cesarean section; LGA (large for gestational age) fetus; Obesity; Anemia; Gestational diabetes mellitus, antepartum; and Supervision of high-risk pregnancy on her problem list.  Patient reports no complaints.  Contractions: Not present. Vag. Bleeding: None.  Movement: Present. Denies leaking of fluid.   The following portions of the patient's history were reviewed and updated as appropriate: allergies, current medications, past family history, past medical history, past social history, past surgical history and problem list. Problem list updated.  Objective:   Filed Vitals:   07/26/15 0956  BP: 124/78  Pulse: 98  Temp: 98.5 F (36.9 C)  Weight: 226 lb 9.6 oz (102.785 kg)    Fetal Status: Fetal Heart Rate (bpm): 145   Movement: Present     General:  Alert, oriented and cooperative. Patient is in no acute distress.  Skin: Skin is warm and dry. No rash noted.   Cardiovascular: Normal heart rate noted  Respiratory: Normal respiratory effort, no problems with respiration noted  Abdomen: Soft, gravid, appropriate for gestational age. Pain/Pressure: Absent     Pelvic: Vag. Bleeding: None Vag D/C Character: White   Cervical exam deferred        Extremities: Normal range of motion.  Edema: None  Mental Status: Normal mood and affect. Normal behavior. Normal judgment and thought content.   Urinalysis: Urine Protein: Negative Urine Glucose: Negative  Assessment and Plan:  Pregnancy: [id number] at [redacted]w[redacted]d  1. Previous cesarean section Desires TOLAC- consent signed  2. Supervision of high-risk pregnancy, third trimester   3. Gestational diabetes mellitus, antepartum CBGs reviewed- fasting  all out of range 98-115 and 2 hr pp majority within range with the exception of post dinner which are all in the 150's. Patient admits to overeating at dinner Will increase bedtime glyburide to 5 mg such as new glyburide dosage is 5 mg BID Follow up growth ultrasound 12/29 NST reviewed and reactive   4. AMA (advanced maternal age) multigravida 70+, third trimester   Preterm labor symptoms and general obstetric precautions including but not limited to vaginal bleeding, contractions, leaking of fluid and fetal movement were reviewed in detail with the patient. Please refer to After Visit Summary for other counseling recommendations.  No Follow-up on file.   Mora Bellman, MD

## 2015-07-26 NOTE — Patient Instructions (Signed)
Contraception Choices Contraception (birth control) is the use of any methods or devices to prevent pregnancy. Below are some methods to help avoid pregnancy. HORMONAL METHODS   Contraceptive implant. This is a thin, plastic tube containing progesterone hormone. It does not contain estrogen hormone. Your health care provider inserts the tube in the inner part of the upper arm. The tube can remain in place for up to 3 years. After 3 years, the implant must be removed. The implant prevents the ovaries from releasing an egg (ovulation), thickens the cervical mucus to prevent sperm from entering the uterus, and thins the lining of the inside of the uterus.  Progesterone-only injections. These injections are given every 3 months by your health care provider to prevent pregnancy. This synthetic progesterone hormone stops the ovaries from releasing eggs. It also thickens cervical mucus and changes the uterine lining. This makes it harder for sperm to survive in the uterus.  Birth control pills. These pills contain estrogen and progesterone hormone. They work by preventing the ovaries from releasing eggs (ovulation). They also cause the cervical mucus to thicken, preventing the sperm from entering the uterus. Birth control pills are prescribed by a health care provider.Birth control pills can also be used to treat heavy periods.  Minipill. This type of birth control pill contains only the progesterone hormone. They are taken every day of each month and must be prescribed by your health care provider.  Birth control patch. The patch contains hormones similar to those in birth control pills. It must be changed once a week and is prescribed by a health care provider.  Vaginal ring. The ring contains hormones similar to those in birth control pills. It is left in the vagina for 3 weeks, removed for 1 week, and then a new one is put back in place. The patient must be comfortable inserting and removing the ring  from the vagina.A health care provider's prescription is necessary.  Emergency contraception. Emergency contraceptives prevent pregnancy after unprotected sexual intercourse. This pill can be taken right after sex or up to 5 days after unprotected sex. It is most effective the sooner you take the pills after having sexual intercourse. Most emergency contraceptive pills are available without a prescription. Check with your pharmacist. Do not use emergency contraception as your only form of birth control. BARRIER METHODS   Female condom. This is a thin sheath (latex or rubber) that is worn over the penis during sexual intercourse. It can be used with spermicide to increase effectiveness.  Female condom. This is a soft, loose-fitting sheath that is put into the vagina before sexual intercourse.  Diaphragm. This is a soft, latex, dome-shaped barrier that must be fitted by a health care provider. It is inserted into the vagina, along with a spermicidal jelly. It is inserted before intercourse. The diaphragm should be left in the vagina for 6 to 8 hours after intercourse.  Cervical cap. This is a round, soft, latex or plastic cup that fits over the cervix and must be fitted by a health care provider. The cap can be left in place for up to 48 hours after intercourse.  Sponge. This is a soft, circular piece of polyurethane foam. The sponge has spermicide in it. It is inserted into the vagina after wetting it and before sexual intercourse.  Spermicides. These are chemicals that kill or block sperm from entering the cervix and uterus. They come in the form of creams, jellies, suppositories, foam, or tablets. They do not require a   prescription. They are inserted into the vagina with an applicator before having sexual intercourse. The process must be repeated every time you have sexual intercourse. INTRAUTERINE CONTRACEPTION  Intrauterine device (IUD). This is a T-shaped device that is put in a woman's uterus  during a menstrual period to prevent pregnancy. There are 2 types:  Copper IUD. This type of IUD is wrapped in copper wire and is placed inside the uterus. Copper makes the uterus and fallopian tubes produce a fluid that kills sperm. It can stay in place for 10 years.  Hormone IUD. This type of IUD contains the hormone progestin (synthetic progesterone). The hormone thickens the cervical mucus and prevents sperm from entering the uterus, and it also thins the uterine lining to prevent implantation of a fertilized egg. The hormone can weaken or kill the sperm that get into the uterus. It can stay in place for 3-5 years, depending on which type of IUD is used. PERMANENT METHODS OF CONTRACEPTION  Female tubal ligation. This is when the woman's fallopian tubes are surgically sealed, tied, or blocked to prevent the egg from traveling to the uterus.  Hysteroscopic sterilization. This involves placing a small coil or insert into each fallopian tube. Your doctor uses a technique called hysteroscopy to do the procedure. The device causes scar tissue to form. This results in permanent blockage of the fallopian tubes, so the sperm cannot fertilize the egg. It takes about 3 months after the procedure for the tubes to become blocked. You must use another form of birth control for these 3 months.  Female sterilization. This is when the female has the tubes that carry sperm tied off (vasectomy).This blocks sperm from entering the vagina during sexual intercourse. After the procedure, the man can still ejaculate fluid (semen). NATURAL PLANNING METHODS  Natural family planning. This is not having sexual intercourse or using a barrier method (condom, diaphragm, cervical cap) on days the woman could become pregnant.  Calendar method. This is keeping track of the length of each menstrual cycle and identifying when you are fertile.  Ovulation method. This is avoiding sexual intercourse during ovulation.  Symptothermal  method. This is avoiding sexual intercourse during ovulation, using a thermometer and ovulation symptoms.  Post-ovulation method. This is timing sexual intercourse after you have ovulated. Regardless of which type or method of contraception you choose, it is important that you use condoms to protect against the transmission of sexually transmitted infections (STIs). Talk with your health care provider about which form of contraception is most appropriate for you.   This information is not intended to replace advice given to you by your health care provider. Make sure you discuss any questions you have with your health care provider.   Document Released: 07/31/2005 Document Revised: 08/05/2013 Document Reviewed: 01/23/2013 [employer] Interactive Patient Education 2016 [employer]  

## 2015-07-30 ENCOUNTER — Other Ambulatory Visit: Payer: Self-pay

## 2015-07-30 ENCOUNTER — Ambulatory Visit (INDEPENDENT_AMBULATORY_CARE_PROVIDER_SITE_OTHER): Payer: Self-pay | Admitting: *Deleted

## 2015-07-30 VITALS — BP 110/63 | HR 84

## 2015-07-30 DIAGNOSIS — Z36 Encounter for antenatal screening of mother: Secondary | ICD-10-CM

## 2015-07-30 DIAGNOSIS — O24419 Gestational diabetes mellitus in pregnancy, unspecified control: Secondary | ICD-10-CM

## 2015-07-30 DIAGNOSIS — O09523 Supervision of elderly multigravida, third trimester: Secondary | ICD-10-CM

## 2015-08-02 ENCOUNTER — Ambulatory Visit (INDEPENDENT_AMBULATORY_CARE_PROVIDER_SITE_OTHER): Payer: Self-pay | Admitting: Family Medicine

## 2015-08-02 ENCOUNTER — Encounter: Payer: Self-pay | Admitting: Family Medicine

## 2015-08-02 VITALS — BP 113/70 | HR 93 | Wt 225.1 lb

## 2015-08-02 DIAGNOSIS — O0993 Supervision of high risk pregnancy, unspecified, third trimester: Secondary | ICD-10-CM

## 2015-08-02 DIAGNOSIS — O09523 Supervision of elderly multigravida, third trimester: Secondary | ICD-10-CM

## 2015-08-02 DIAGNOSIS — O24419 Gestational diabetes mellitus in pregnancy, unspecified control: Secondary | ICD-10-CM

## 2015-08-02 DIAGNOSIS — Z98891 History of uterine scar from previous surgery: Secondary | ICD-10-CM

## 2015-08-02 LAB — POCT URINALYSIS DIP (DEVICE)
BILIRUBIN URINE: NEGATIVE
GLUCOSE, UA: NEGATIVE mg/dL
Hgb urine dipstick: NEGATIVE
KETONES UR: NEGATIVE mg/dL
LEUKOCYTES UA: NEGATIVE
NITRITE: NEGATIVE
PH: 5.5 (ref 5.0–8.0)
Protein, ur: NEGATIVE mg/dL
Specific Gravity, Urine: 1.025 (ref 1.005–1.030)
Urobilinogen, UA: 0.2 mg/dL (ref 0.0–1.0)

## 2015-08-02 LAB — OB RESULTS CONSOLE GBS: GBS: NEGATIVE

## 2015-08-02 MED ORDER — GLYBURIDE 5 MG PO TABS: ORAL_TABLET | ORAL | Status: DC

## 2015-08-02 NOTE — Patient Instructions (Signed)
Tercer trimestre de Media planner (Third Trimester of Pregnancy) El tercer trimestre comprende desde la [id number] hasta la Morgan Ramsey, es decir, desde el mes7 hasta el mes9. El tercer trimestre es un perodo en el que el feto crece rpidamente. Hacia el final del noveno mes, el feto mide alrededor de 20pulgadas (45cm) de largo y pesa entre 6 y 67 libras (2,700 y 36,500kg).  CAMBIOS EN EL ORGANISMO Su organismo atraviesa por muchos cambios durante el Morgan Ramsey, y estos varan de Morgan Ramsey a Morgan Ramsey.   Seguir [employer]. Es de esperar que aumente entre 25 y 35libras (3 y 16kg) hacia el final del Media planner.  Podrn aparecer las primeras Apache Corporation caderas, el abdomen y las [place].  Puede tener necesidad de Garment/textile technologist con ms frecuencia porque el feto baja hacia la pelvis y ejerce presin sobre la vejiga.  Debido al Glennis Brink podr sentir Morgan Ramsey estomacal con frecuencia.  Puede estar estreida, ya que ciertas hormonas enlentecen los movimientos de los msculos que Morgan Ramsey a travs de los intestinos.  Pueden aparecer hemorroides o abultarse e hincharse las venas (venas varicosas).  Puede sentir dolor plvico debido al Medtronic y a que las hormonas del Scientist, research (life sciences) las articulaciones entre los huesos de la pelvis. El dolor de espalda puede ser consecuencia de la sobrecarga de los msculos que soportan la [place].  Tal vez haya cambios en el cabello que pueden incluir su engrosamiento, crecimiento rpido y cambios en la textura. Adems, a algunas mujeres se les cae el cabello durante o despus del embarazo, o tienen el cabello seco o fino. Lo ms probable es que el cabello se le normalice despus del nacimiento del beb.  Las Lincoln National Corporation seguirn creciendo y Teaching laboratory technician. A veces, puede haber una secrecin amarilla de las mamas llamada calostro.  El ombligo puede salir hacia afuera.  Puede sentir que le falta el aire debido a que se expande el tero.  Puede notar que el feto  "baja" o lo siente ms bajo, en el abdomen.  Puede tener una prdida de secrecin mucosa con sangre. Esto suele ocurrir en el trmino de unos pocos das a una semana antes de que comience el [place] de [place].  El cuello del tero se vuelve delgado y blando (se borra) cerca de la fecha de [place]. QU DEBE ESPERAR EN LOS EXMENES PRENATALES  Le harn exmenes prenatales cada 2semanas hasta la semana36. A partir de ese momento le harn exmenes semanales. Durante una visita prenatal de rutina:  La pesarn para asegurarse de que usted y el feto estn creciendo normalmente.  Le tomarn la presin arterial.  Le medirn el abdomen para controlar el desarrollo del beb.  Se escucharn los latidos cardacos fetales.  Se evaluarn los resultados de los estudios solicitados en visitas anteriores.  Le revisarn el cuello del tero cuando est prxima la fecha de parto para controlar si este se ha borrado. Alrededor de la semana36, el mdico le revisar el cuello del tero. Al mismo tiempo, realizar un anlisis de las secreciones del tejido vaginal. Este examen es para determinar si hay un tipo de bacteria, estreptococo Grupo B. El mdico le explicar esto con ms detalle. El mdico puede preguntarle lo siguiente:  Cmo le gustara que fuera el [place].  Cmo se siente.  Si siente los movimientos del beb.  Si ha tenido sntomas anormales, como prdida de lquido, [place], dolores de cabeza intensos o clicos abdominales.  Si est consumiendo algn producto que contenga tabaco, como cigarrillos, tabaco  de Higher education careers adviser y Psychologist, sport and exercise.  Si tiene Sunoco. Otros exmenes o estudios de deteccin que pueden realizarse durante el tercer trimestre incluyen lo siguiente:  Anlisis de sangre para controlar los niveles de hierro (anemia).  Controles fetales para determinar su salud, nivel de [place] y Mining engineer. Si tiene [place] enfermedad o hay problemas durante el embarazo, le harn  estudios.  Prueba del VIH (virus de inmunodeficiencia humana). Si corre Electronics engineer, pueden realizarle una prueba de deteccin del VIH durante el tercer trimestre del embarazo. FALSO TRABAJO DE PARTO Es posible que sienta contracciones leves e irregulares que finalmente desaparecen. Se llaman contracciones de Braxton Hicks o falso trabajo de [place]. Las Yahoo pueden durar horas, das o incluso semanas, antes de que el verdadero trabajo de parto se inicie. Si las contracciones ocurren a intervalos regulares, se intensifican o se hacen dolorosas, lo mejor es que la revise el mdico.  SIGNOS DE TRABAJO DE PARTO   Clicos de tipo menstrual.  Contracciones cada 48mnutos o menos.  Contracciones que comienzan en la parte superior del tero y se extienden hacia abajo, a la zona inferior del abdomen y la espalda.  Sensacin de mayor presin en la pelvis o dolor de espalda.  Una secrecin de mucosidad acuosa o con sangre que sale de la vagina. Si tiene alguno de estos signos antes de la [id number] eMedia planner llame a su mdico de inmediato. Debe concurrir al hospital para que la controlen inmediatamente. INSTRUCCIONES PARA EL CUIDADO EN EL HOGAR   Evite fumar, consumir hierbas, beber alcohol y tomar frmacos que no le hayan recetado. Estas sustancias qumicas afectan la formacin y el desarrollo del beb.  No consuma ningn producto que contenga tabaco, lo que incluye cigarrillos, tabaco de mHigher education careers advisery cPsychologist, sport and exercise Si necesita ayuda para dejar de fumar, consulte al mMeadWestvaco Puede recibir asesoramiento y otro tipo de recursos para dejar de fumar.  SHardinsburgmdico en relacin con el uso de medicamentos. Durante el embarazo, hay medicamentos que son seguros de tomar y otros que no.  Haga ejercicio solamente como se lo haya indicado el mdico. Sentir clicos uterinos es un buen signo para dAmbulance personactividad fsica.  Contine comiendo alimentos sanos con  regularidad.  Use un sostn que le brinde buen soporte si le dNordstrom  No se d baos de inmersin en agua caliente, baos turcos ni saunas.  Use el cinturn de seguridad en todo momento mientras conduce.  No coma carne cruda ni queso sin cocinar; evite el contacto con las bandejas sanitarias de los gatos y la tierra que estos animales usan. Estos elementos contienen grmenes que pueden causar defectos congnitos en el beb.  TSebastian  Tome entre 1500 y 20017mde calcio diariamente comenzando en la [id number] embarazo haConvoy Si est estreida, pruebe un laxante suave (si el mdico lo autoriza). Consuma ms alimentos ricos en fibra, como vegetales y frutas frescos y cePsychologist, prison and probation servicesBeba gran cantidad de lquido para mantener la orina de tono claro o color amarillo plido.  Dese baos de asiento con agua tibia para alBest boy las molestias causadas por las hemorroides. Use una crema para las hemorroides si el mdico la autoriza.  Si tiene venas varicosas, use medias de descanso. Eleve los pies durante 1518mtos, 3 o 4veces por da. Limite el consumo de sal en su dieta.  Evite levantar objetos pesados, use zapatos de tacones bajos y manWestern SaharaDescanse  con las piernas elevadas si tiene calambres o dolor de cintura.  Visite a su dentista si no lo ha Quarry Ramsey. Use un cepillo de dientes blando para higienizarse los dientes y psese el hilo dental con suavidad.  Puede seguir [employer], a menos que el mdico le indique lo contrario.  No haga viajes largos excepto que sea absolutamente necesario y solo con la autorizacin del [place] clases prenatales para Development worker, international aid, Psychologist, prison and probation services y hacer preguntas sobre el [place] de parto y [place].  Haga un ensayo de la partida al hospital.  Prepare el bolso que llevar al hospital.  Prepare la habitacin del beb.  Concurra a todas  las visitas prenatales segn las indicaciones de su mdico. SOLICITE ATENCIN MDICA SI:  No est segura de que est en trabajo de parto o de que ha roto la bolsa de las aguas.  Tiene mareos.  Siente clicos leves, presin en la pelvis o dolor persistente en el abdomen.  Tiene nuseas, vmitos o diarrea persistentes.  Margette Fast secrecin vaginal con mal olor.  Siente dolor al Continental Airlines. SOLICITE ATENCIN MDICA DE INMEDIATO SI:   Tiene fiebre.  Tiene una prdida de lquido por la vagina.  Tiene sangrado o pequeas prdidas vaginales.  Siente dolor intenso o clicos en el abdomen.  Sube o baja de peso rpidamente.  Tiene dificultad para respirar y siente dolor de pecho.  Sbitamente se le hinchan mucho el rostro, las [place], los tobillos, los pies o las piernas.  No ha sentido los movimientos del beb durante Leone Brand.  Siente un dolor de cabeza intenso que no se alivia con medicamentos.  Su visin se modifica.   Esta informacin no tiene Marine scientist el consejo del mdico. Asegrese de hacerle al mdico cualquier pregunta que tenga.   Document Released: 05/10/2005 Document Revised: 08/21/2014 Elsevier Interactive Patient Education 2016 [place] (Breastfeeding) Decidir Economist es una de las mejores elecciones que puede hacer por usted y su beb. El cambio hormonal durante el Media planner produce el desarrollo del tejido mamario y [place] la cantidad y el tamao de los conductos galactforos. Estas hormonas tambin permiten que las protenas, los azcares y las grasas de la sangre produzcan la Northeast Utilities materna en las glndulas productoras de [place]. Las hormonas impiden que la leche materna sea liberada antes del nacimiento del beb, adems de impulsar el flujo de leche luego del nacimiento. Una vez que ha comenzado a Economist, Freight forwarder beb, as Therapist, occupational succin o Social research officer, government, pueden estimular la liberacin de [place] de las glndulas productoras de  [place].  LOS BENEFICIOS DE AMAMANTAR Para el beb  La primera leche (calostro) ayuda a Garment/textile technologist funcionamiento del sistema digestivo del beb.  La leche tiene anticuerpos que ayudan a Chemical engineer las infecciones en el beb.  El beb tiene una menor incidencia de asma, alergias y del sndrome de muerte sbita del lactante.  Los nutrientes en la [place] materna son mejores para el beb que la [place] maternizada y estn preparados exclusivamente para cubrir las necesidades del beb.  La leche materna mejora el desarrollo cerebral del beb.  Es menos probable que el beb desarrolle otras enfermedades, como obesidad infantil, asma o diabetes mellitus de tipo 2. Para usted   La lactancia materna favorece el desarrollo de un vnculo muy especial entre la madre y el beb.  Es conveniente. La leche materna siempre est disponible a la Tree surgeon y es [place].  La lactancia materna  ayuda a Medical illustrator y a perder el peso ganado durante el [place].  Favorece la contraccin del tero al tamao que tena antes del embarazo de manera ms rpida y disminuye el sangrado (loquios) despus del parto.  La lactancia materna contribuye a reducir Catering Ramsey de desarrollar diabetes mellitus de tipo 2, osteoporosis o cncer de mama o de ovario en el futuro. SIGNOS DE QUE EL BEB EST HAMBRIENTO Primeros signos de hambre  Aumenta su estado de [place].  Se estira.  Mueve la cabeza de un lado a otro.  Mueve la cabeza y abre la boca cuando se le toca la mejilla o la comisura de la boca (reflejo de bsqueda).  [place] vocalizaciones, tales como sonidos de succin, se relame los labios, emite arrullos, suspiros, o chirridos.  Mueve la [employer] boca.  Se chupa con ganas los dedos o las manos. Signos tardos de Hartford Financial.  Llora de manera intermitente. Signos de BJ[employer] Wholesale signos de hambre extrema requerirn que lo calme y lo consuele antes de que el  beb pueda alimentarse adecuadamente. No espere a que se manifiesten los siguientes signos de hambre extrema para comenzar a Economist:   Air cabin crew.  Llanto intenso y fuerte.  Gritos. INFORMACIN BSICA SOBRE LA LACTANCIA MATERNA Iniciacin de la lactancia materna  Encuentre un lugar cmodo para sentarse o acostarse, con un buen respaldo para el cuello y la espalda.  Coloque una almohada o una manta enrollada debajo del beb para acomodarlo a la altura de la mama (si est sentada). Las almohadas para Economist se han diseado especialmente a fin de servir de apoyo para los brazos y el beb Kellogg.  Asegrese de que el abdomen del beb est frente al suyo.   Masajee suavemente la mama. Con las yemas de los dedos, masajee la pared del pecho hacia el pezn en un movimiento circular. Esto estimula el flujo de [place]. Es posible que Oceanographer este movimiento mientras amamanta si la leche fluye lentamente.  Sostenga la mama con el pulgar por arriba del pezn y los otros 4 dedos por debajo de la mama. Asegrese de que los dedos se encuentren lejos del pezn y de la boca del beb.  Empuje suavemente los labios del beb con el pezn o con el dedo.  Cuando la boca del beb se abra lo suficiente, acrquelo rpidamente a la mama e introduzca todo el pezn y la zona oscura que lo rodea (areola), tanto como sea posible, dentro de la boca del beb.  Debe haber ms areola visible por arriba del labio superior del beb que por debajo del labio inferior.  La lengua del beb debe estar entre la enca inferior y la [place].  Asegrese de que la boca del beb est en la posicin correcta alrededor del pezn (prendida). Los labios del beb deben crear un sello sobre la mama y estar doblados hacia afuera (invertidos).  Es comn que el beb succione durante 2 a 3 minutos para que comience el flujo de [place]. Cmo debe prenderse Es muy importante que le ensee al beb cmo prenderse  adecuadamente a la mama. Si el beb no se prende adecuadamente, puede causarle dolor en el pezn y reducir la produccin de [place], y hacer que el beb tenga un escaso aumento de [place]. Adems, si el beb no se prende adecuadamente al pezn, puede tragar aire durante la alimentacin. Esto puede causarle molestias al beb. Hacer eructar al beb al Eliezer Lofts  de mama puede ayudarlo a Futures trader. Sin embargo, ensearle al beb cmo prenderse a la mama adecuadamente es la mejor manera de evitar que se sienta molesto por tragar Administrator, sports se alimenta. Signos de que el beb se ha prendido adecuadamente al pezn:   Hall Busing o succiona de modo silencioso, sin causarle dolor.  Se escucha que traga cada 3 o 4 succiones.  Hay movimientos musculares por arriba y por delante de sus odos al Mining engineer. Signos de que el beb no se ha prendido Product Ramsey al pezn:   Hace ruidos de succin o de chasquido mientras se alimenta.  Siente dolor en el pezn. Si cree que el beb no se prendi correctamente, deslice el dedo en la comisura de la boca y Micron Technology las encas del beb para interrumpir la succin. Intente comenzar a amamantar nuevamente. Signos de Economist Signos del beb:   Disminuye gradualmente el nmero de succiones o cesa la succin por completo.  Se duerme.  Relaja el cuerpo.  Retiene una pequea cantidad de ALLTEL Corporation boca.  Se desprende solo del pecho. Signos que presenta usted:  Las mamas han aumentado la firmeza, el peso y el tamao 1 a 3 horas despus de Economist.  Estn ms blandas inmediatamente despus de amamantar.  Un aumento del volumen de [place], y tambin un cambio en su consistencia y color se producen hacia el [place] de Therapist, nutritional.  Los pezones no duelen, ni estn agrietados ni sangran. Signos de que su beb recibe la cantidad de leche suficiente  Moja al menos 3 paales en 24 horas. La orina debe ser clara y de color  amarillo plido a los [place].  Defeca al menos 3 veces en 24 horas a los 5 das de vida. La materia fecal debe ser blanda y [place].  Defeca al menos 3 veces en 24 horas a los 7 das de vida. La materia fecal debe ser grumosa y [place].  No registra una prdida de peso mayor del 10% del peso al nacer durante los primeros [place].  Aumenta de peso un promedio de 4 a 7onzas (113 a 198g) por semana despus de los [place].  Aumenta de [place], [place], de [place] uniforme a Proofreader de los 5 das de vida, sin Museum/gallery curator prdida de peso despus de las 2semanas de vida. Despus de alimentarse, es posible que el beb regurgite una pequea cantidad. Esto es frecuente. FRECUENCIA Y DURACIN DE LA LACTANCIA MATERNA El amamantamiento frecuente la ayudar a producir ms [place] y a Warden/ranger de Social research officer, government en los pezones e hinchazn en las [place]. Alimente al beb cuando muestre signos de hambre o si siente la necesidad de reducir la congestin de las [place]. Esto se denomina "lactancia a demanda". Evite el uso del chupete mientras trabaja para establecer la lactancia (las primeras 4 a 6 semanas despus del nacimiento del beb). Despus de este perodo, podr ofrecerle un chupete. Las investigaciones demostraron que el uso del chupete durante el primer ao de vida del beb disminuye el riesgo de desarrollar el sndrome de muerte sbita del lactante (SMSL). Permita que el nio se alimente en cada mama todo lo que desee. Contine amamantando al beb hasta que haya terminado de alimentarse. Cuando el beb se desprende o se queda dormido mientras se est alimentando de la primera mama, ofrzcale la segunda. Debido a que, con frecuencia, los recin [place] las primeras semanas de vida, es posible que  deba despertar al beb para alimentarlo. Los horarios de Writer de un beb a otro. Sin embargo, las siguientes reglas pueden servir como gua para ayudarla a  Engineer, materials que el beb se alimenta adecuadamente:  Se puede amamantar a los recin nacidos (bebs de 4 semanas o menos de vida) cada 1 a 3 horas.  No deben transcurrir ms de 3 horas durante el da o 5 horas durante la noche sin que se amamante a los recin nacidos.  Debe amamantar al beb 8 veces como mnimo en un perodo de 24 horas, hasta que comience a introducir slidos en su dieta, a los 6 meses de vida aproximadamente. [place] extraccin y Recruitment consultant de la leche materna le permiten asegurarse de que el beb se alimente exclusivamente de [place], aun en momentos en los que no puede amamantar. Esto tiene especial importancia si debe regresar al [place] en el perodo en que an est amamantando o si no puede estar presente en los momentos en que el beb debe alimentarse. Su asesor en lactancia puede orientarla sobre cunto tiempo es [place].  El sacaleche es un aparato que le permite extraer leche de la mama a un recipiente estril. Luego, la leche materna extrada puede almacenarse en un refrigerador o Pension scheme Ramsey. Algunos sacaleches son Theodore Demark, James Ivanoff otros son elctricos. Consulte a su asesor en lactancia qu tipo ser ms conveniente para usted. Los sacaleches se pueden comprar; sin embargo, algunos hospitales y grupos de apoyo a la lactancia materna alquilan Production assistant, radio. Un asesor en lactancia puede ensearle cmo extraer OfficeMax Incorporated, en caso de que prefiera no usar un sacaleche.  CMO CUIDAR LAS MAMAS DURANTE LA LACTANCIA MATERNA Los pezones se secan, agrietan y duelen durante la Therapist, nutritional. Las siguientes recomendaciones pueden ayudarla a Morgan Ramsey las [employer] y sanas:  Art therapist usar jabn en los pezones.  Use un sostn de soporte. Aunque no son esenciales, las camisetas sin mangas o los sostenes especiales para Economist estn diseados para acceder fcilmente a las mamas, para Economist  sin tener que quitarse todo el sostn o la camiseta. Evite usar sostenes con aro o sostenes muy ajustados.  Seque al aire sus pezones durante 3 a 33mnutos despus de amamantar al beb.  Utilice solo apsitos de aChiropodistsostn para aTax adviserlas prdidas de lDaviston La prdida de un poco de lOwens Corningtomas es normal.  Utilice lanolina sobre los pezones luego de aEconomist La lanolina ayuda a mantener la humedad normal de la piel. Si uCanadalanolina pura, no tiene que lavarse los pezones antes de volver a aResearch scientist (life sciences)al beb. La lanolina pura no es txica para el beb. Adems, puede extraer mCiscoalgunas gotas de lOakleymaterna y mCommunity education officersuavemente esa lFranklin Resources para que la lCentrevillese seque al aire. Durante las primeras semanas despus de dar a luz, algunas mujeres pueden experimentar hinchazn en las mamas (congestin mFour Corn[place]. La congestin puede hacer que sienta las mamas pesadas, calientes y sensibles al tacto. El pico de la congestin ocurre dentro de los 3 a 5 das despus del pMontgomery Las siguientes recomendaciones pueden ayudarla a aPublic house managerla congestin:  Vace por completo las mamas al aGaines[place] Puede aplicar calor hmedo en las mamas (en la ducha o con toallas hmedas para manos) antes de aEconomisto extraer lNortheast Utilities Esto aumenta la circulacin y aSaint Helenaa que la lCantril Si el beb no vaca por completo las mLincoln National Corporation  mamas cuando lo amamanta, extraiga la leche restante despus de que haya finalizado.  Use un sostn ajustado (para amamantar o comn) o una camiseta sin mangas durante 1 o 2 das para indicar al cuerpo que disminuya ligeramente la produccin de leche.  Aplique compresas de hielo sobre las mamas, a menos que le resulte demasiado incmodo.  Asegrese de que el beb est prendido y se encuentre en la posicin correcta mientras lo alimenta. Si la congestin persiste luego de 48 horas o despus de seguir estas recomendaciones, comunquese con su  mdico o un asesor en lactancia. RECOMENDACIONES GENERALES PARA EL CUIDADO DE LA SALUD DURANTE LA LACTANCIA MATERNA  Consuma alimentos saludables. Alterne comidas y colaciones, y coma 3 de cada una por da. Dado que lo que come afecta la leche materna, es posible que algunas comidas hagan que su beb se vuelva ms irritable de lo habitual. Evite comer este tipo de alimentos si percibe que afectan de manera negativa al beb.  Beba leche, jugos de fruta y agua para satisfacer su sed (aproximadamente 10 vasos al da).  Descanse con frecuencia, reljese y tome sus vitaminas prenatales para evitar la fatiga, el estrs y la anemia.  Contine con los autocontroles de la mama.  Evite masticar y fumar tabaco. Las sustancias qumicas de los cigarrillos que pasan a la leche materna y la exposicin al humo ambiental del tabaco pueden daar al beb.  No consuma alcohol ni drogas, incluida la marihuana. Algunos medicamentos, que pueden ser perjudiciales para el beb, pueden pasar a travs de la leche materna. Es importante que consulte a su mdico antes de tomar cualquier medicamento, incluidos todos los medicamentos recetados y de venta libre, as como los suplementos vitamnicos y herbales. Puede quedar embarazada durante la lactancia. Si desea controlar la natalidad, consulte a su mdico cules son las opciones ms seguras para el beb. SOLICITE ATENCIN MDICA SI:   Usted siente que quiere dejar de amamantar o se siente frustrada con la lactancia.  Siente dolor en las mamas o en los pezones.  Sus pezones estn agrietados o sangran.  Sus pechos estn irritados, sensibles o calientes.  Tiene un rea hinchada en cualquiera de las mamas.  Siente escalofros o fiebre.  Tiene nuseas o vmitos.  Presenta una secrecin de otro lquido distinto de la leche materna de los pezones.  Sus mamas no se llenan antes de amamantar al beb para el quinto da despus del parto.  Se siente triste y  deprimida.  El beb est demasiado somnoliento como para comer bien.  El beb tiene problemas para dormir.  Moja menos de 3 paales en 24 horas.  Defeca menos de 3 veces en 24 horas.  La piel del beb o la parte blanca de los ojos se vuelven amarillentas.  El beb no ha aumentado de peso a los 5 das de vida. SOLICITE ATENCIN MDICA DE INMEDIATO SI:   El beb est muy cansado (letargo) y no se quiere despertar para comer.  Le sube la fiebre sin causa.   Esta informacin no tiene como fin reemplazar el consejo del mdico. Asegrese de hacerle al mdico cualquier pregunta que tenga.   Document Released: 07/31/2005 Document Revised: 04/21/2015 Elsevier Interactive Patient Education 2016 Elsevier Inc.  

## 2015-08-02 NOTE — Progress Notes (Signed)
Subjective:  Morgan Ramsey is a 39 y.o. [id number] at [redacted]w[redacted]d being seen today for ongoing prenatal care.  She is currently monitored for the following issues for this high-risk pregnancy and has Language barrier; AMA (advanced maternal age) multigravida 35+; H/O herpes simplex type 2 infection; Previous cesarean section; LGA (large for gestational age) fetus; Obesity; Anemia; Gestational diabetes mellitus, antepartum; and Supervision of high-risk pregnancy on her problem list.  Patient reports no complaints.  Contractions: Not present. Vag. Bleeding: None.  Movement: Present. Denies leaking of fluid.   The following portions of the patient's history were reviewed and updated as appropriate: allergies, current medications, past family history, past medical history, past social history, past surgical history and problem list. Problem list updated.  Objective:   Filed Vitals:   08/02/15 1042  BP: 113/70  Pulse: 93  Weight: 225 lb 1.6 oz (102.105 kg)    Fetal Status: Fetal Heart Rate (bpm): NST Fundal Height: 36 cm Movement: Present     General:  Alert, oriented and cooperative. Patient is in no acute distress.  Skin: Skin is warm and dry. No rash noted.   Cardiovascular: Normal heart rate noted  Respiratory: Normal respiratory effort, no problems with respiration noted  Abdomen: Soft, gravid, appropriate for gestational age. Pain/Pressure: Absent     Pelvic: Vag. Bleeding: None     Cervical exam deferred        Extremities: Normal range of motion.  Edema: None  Mental Status: Normal mood and affect. Normal behavior. Normal judgment and thought content.   Urinalysis: Urine Protein: Negative Urine Glucose: Negative FBS 75-106 most are less than 90 2 hour pp 45-150 most are out of range NST reviewed and reactive.  Assessment and Plan:  Pregnancy: [id number] at [redacted]w[redacted]d  1. Gestational diabetes mellitus, antepartum Increase her Glyburide to 7.5 q am - Culture, beta strep (group b only) -  GC/Chlamydia probe amp ([place])not at [facility] - glyBURIDE (DIABETA) 5 MG tablet; Take 1.5 tablet at breakfast and 1 tablet at bedtime  Dispense: 90 tablet; Refill: 2  2. AMA (advanced maternal age) multigravida 89+, third trimester 2x/wk testing continues  3. Supervision of high-risk pregnancy, third trimester - Culture, beta strep (group b only) - GC/Chlamydia probe amp ([place])not at [facility]  4. Previous cesarean section Desires TOLAC--will consider again after U/S is done at 38 wks  Preterm labor symptoms and general obstetric precautions including but not limited to vaginal bleeding, contractions, leaking of fluid and fetal movement were reviewed in detail with the patient. Please refer to After Visit Summary for other counseling recommendations.  Return in about 4 days (around 08/06/2015) for 2x/wk as scheduled.   Donnamae Jude, MD

## 2015-08-02 NOTE — Progress Notes (Signed)
Interpreter Lockie Mola present for encounter.  [place] for growth and BPP scheduled 12/29.  Pt requested refill of Glyburide - since dose was changed last week, new Rx sen to pharmacy.

## 2015-08-03 ENCOUNTER — Telehealth: Payer: Self-pay | Admitting: General Practice

## 2015-08-03 LAB — GC/CHLAMYDIA PROBE AMP (~~LOC~~) NOT AT ARMC
CHLAMYDIA, DNA PROBE: NEGATIVE
NEISSERIA GONORRHEA: NEGATIVE

## 2015-08-03 NOTE — Telephone Encounter (Signed)
Patient called and left message on nurse line in [place] requesting a call back from the nurse. Called patient with pacific interpreter [phone], no answer- left message stating we are trying to reach you to return your phone call, please call us back at the clinics

## 2015-08-04 LAB — CULTURE, BETA STREP (GROUP B ONLY)

## 2015-08-04 NOTE — Telephone Encounter (Signed)
Patient called yesterday afternoon 12/20 in [place] stating she is returning our call

## 2015-08-06 ENCOUNTER — Ambulatory Visit (INDEPENDENT_AMBULATORY_CARE_PROVIDER_SITE_OTHER): Payer: Self-pay | Admitting: *Deleted

## 2015-08-06 VITALS — BP 120/71 | HR 90

## 2015-08-06 DIAGNOSIS — O09523 Supervision of elderly multigravida, third trimester: Secondary | ICD-10-CM

## 2015-08-06 DIAGNOSIS — Z36 Encounter for antenatal screening of mother: Secondary | ICD-10-CM

## 2015-08-06 DIAGNOSIS — O24419 Gestational diabetes mellitus in pregnancy, unspecified control: Secondary | ICD-10-CM

## 2015-08-06 NOTE — Telephone Encounter (Signed)
Pt was @ clinic for appt today. She stated that the Rx for Valtrex was too expensive so she scheduled appt @ [facility] on 12/21. She was given the medication from their pharmacy.

## 2015-08-06 NOTE — Progress Notes (Signed)
Pt reports decreased Fm yesterday - very good FM during NST today.  Pt advised to go to [facility] for future occasions of decreased FM.  Per [place] today, baby is breech - will check again @ next visit on 12/27 and determine delivery plan.

## 2015-08-06 NOTE — Progress Notes (Signed)
NST reactive.

## 2015-08-10 ENCOUNTER — Ambulatory Visit (INDEPENDENT_AMBULATORY_CARE_PROVIDER_SITE_OTHER): Payer: Self-pay | Admitting: Family Medicine

## 2015-08-10 VITALS — BP 120/63 | HR 89

## 2015-08-10 DIAGNOSIS — O0993 Supervision of high risk pregnancy, unspecified, third trimester: Secondary | ICD-10-CM

## 2015-08-10 DIAGNOSIS — O24419 Gestational diabetes mellitus in pregnancy, unspecified control: Secondary | ICD-10-CM

## 2015-08-10 DIAGNOSIS — Z98891 History of uterine scar from previous surgery: Secondary | ICD-10-CM

## 2015-08-10 DIAGNOSIS — O09523 Supervision of elderly multigravida, third trimester: Secondary | ICD-10-CM

## 2015-08-10 LAB — POCT URINALYSIS DIP (DEVICE)
Bilirubin Urine: NEGATIVE
GLUCOSE, UA: 250 mg/dL — AB
Hgb urine dipstick: NEGATIVE
KETONES UR: NEGATIVE mg/dL
Leukocytes, UA: NEGATIVE
Nitrite: NEGATIVE
PROTEIN: NEGATIVE mg/dL
Urobilinogen, UA: 0.2 mg/dL (ref 0.0–1.0)
pH: 5.5 (ref 5.0–8.0)

## 2015-08-10 MED ORDER — METFORMIN HCL 500 MG PO TABS: 500.0000 mg | ORAL_TABLET | Freq: Two times a day (BID) | ORAL | Status: DC

## 2015-08-10 NOTE — Patient Instructions (Signed)
Diabetes mellitus gestacional (Gestational Diabetes Mellitus) La diabetes mellitus gestacional, ms comnmente conocida como diabetes gestacional es un tipo de diabetes que desarrollan algunas mujeres durante el [place]. En la diabetes gestacional, el pncreas no produce suficiente insulina (una hormona) o las clulas son menos sensibles a la insulina producida (resistencia a la insulina), o ambas cosas. Normalmente, la [employer] azcares de los alimentos a las clulas de los tejidos. Las clulas de los tejidos [employer] azcares para Dealer. La falta de insulina o la falta de una respuesta normal a la insulina hace que el exceso de azcar se acumule en la sangre en lugar de Location manager en las clulas de los tejidos. Como resultado, se producen niveles altos de Dispensing optician (hiperglucemia). El efecto de los niveles altos de Location manager (glucosa) puede causar muchos problemas.  FACTORES DE RIESGO Usted tiene mayor probabilidad de desarrollar diabetes gestacional si tiene antecedentes familiares de diabetes y tambin si tiene uno o ms de los siguientes factores de riesgo:  ndice de masa corporal superior a 30 (obesidad).  Embarazo previo con diabetes gestacional.  La edad avanzada en el momento del embarazo. Si se mantienen los niveles de glucosa en la sangre en un rango normal durante el [place], las mujeres pueden tener un embarazo saludable. Si los niveles de glucosa en la sangre no estn bien controlados, puede haber riesgos para usted, el feto o el recin nacido, o durante el trabajo de parto y [place].  SNTOMAS  Si se presentan sntomas, stos son similares a los sntomas que normalmente experimentar durante el embarazo. Los sntomas de la diabetes gestacional son:   Lovey Newcomer de la sed (polidipsia).  Aumento de la miccin (poliuria).  Orina con ms frecuencia durante la noche (nocturia).  Prdida de peso. La prdida de peso puede ser muy rpida.  Infecciones  frecuentes y recurrentes.  Cansancio (fatiga).  Debilidad.  Cambios en la visin, como visin borrosa.  Olor a Medical illustrator.  Dolor abdominal. DIAGNSTICO La diabetes se diagnostica cuando hay aumento de los niveles de glucosa en la [place]. El nivel de glucosa en la sangre puede controlarse en uno o ms de los siguientes anlisis de sangre:  Medicin de glucosa en la sangre en [place]. No se le permitir comer durante al menos 8 horas antes de que se tome [place] de [place].  Pruebas al azar de glucosa en la sangre. El nivel de glucosa en la sangre se controla en cualquier momento del da sin importar el momento en que haya comido.  Prueba de tolerancia a la glucosa oral (PTGO). La glucosa en la sangre se mide despus de no haber comido (ayunas) durante una a tres horas y despus de beber una bebida que contenga glucosa. Dado que las hormonas que causan la resistencia a la insulina son ms altas alrededor Navistar International Corporation 24 a 28 de Media planner, generalmente se realiza una PTGO durante ese tiempo. Si tiene factores de riesgo, en la primera visita prenatal pueden hacerle pruebas de deteccin de diabetes tipo 2 no diagnosticada. TRATAMIENTO  La diabetes gestacional debe controlarse en primer lugar con dieta y ejercicios. Pueden agregarse medicamentos, pero solo si son necesarios.  Usted tendr que tomar medicamentos para la diabetes o insulina diariamente para Theatre manager los niveles de glucosa en la sangre en el rango deseado.  Usted tendr Avaya dosis de insulina con la actividad fsica y la eleccin de alimentos saludables. Si tiene diabetes gestacional, el objetivo del tratamiento ser  mantener los siguientes niveles sanguneos de glucosa:  Antes de las comidas (preprandial): valor de 95 mg/dl o inferior.  Despus de las comidas (posprandial):  Una hora despus de la comida: valor de 140 mg/dl o inferior.  Dos horas despus de la comida: valor de 120 mg/dl o  inferior. Si tiene diabetes tipo 1 o tipo 2 preexistente, el objetivo del tratamiento ser mantener los siguientes niveles sanguneos de glucosa:  Antes de las comidas, a la hora de acostarse y durante la noche: de 60 a 99 mg/dl.  Despus de las comidas: valor mximo de 100 a 129 mg/dl. INSTRUCCIONES PARA EL CUIDADO EN EL HOGAR   Controle su nivel de hemoglobina A1c dos veces al ao.  Contrlese a diario el nivel de glucosa en la sangre segn las indicaciones de su mdico. Es comn realizar controles frecuentes de la glucosa en la sangre.  Supervise las cetonas en la orina cuando est enferma y segn las indicaciones de su mdico.  Tome el medicamento para la diabetes y adminstrese insulina segn las indicaciones de su mdico para mantener el nivel de glucosa en la sangre en el rango deseado.  Nunca se quede sin medicamento para la diabetes o sin insulina. Es necesario que la reciba todos los das.  Ajuste la insulina segn la ingesta de hidratos de carbono. Los hidratos de carbono pueden aumentar los niveles de glucosa en la sangre, pero deben incluirse en su dieta. Los hidratos de carbono aportan vitaminas, minerales y fibra que son una parte esencial de una dieta saludable. Los hidratos de carbono se encuentran en frutas, verduras, cereales integrales, productos lcteos, legumbres y alimentos que contienen azcares aadidos.  Consuma alimentos saludables. Alterne 3 comidas con 3 colaciones.  Aumente de peso saludablemente. El aumento del peso total vara de acuerdo con el ndice de masa corporal que tena antes del embarazo (IMC).  Lleve una tarjeta de alerta mdica o use una pulsera o medalla de alerta mdica.  Lleve con usted una colacin de 15gramos de hidratos de carbono en todo momento para controlar los niveles bajos de glucosa en la sangre (hipoglucemia). Algunos ejemplos de colaciones de 15gramos de hidratos de carbono son los siguientes:  Tabletas de glucosa, 3 o 4.  Gel  de glucosa, tubo de 15 gramos.  Pasas de uva, 2 cucharadas (24 g).  Caramelos de goma, 6.  Galletas de animales, 8.  Jugo de fruta, gaseosa comn, o leche descremada, 4 onzas (120 ml).  Pastillas de goma, 9.  Reconocer la hipoglucemia. Durante el embarazo la hipoglucemia se produce cuando hay niveles de glucosa en la sangre de 60 mg/dl o menos. El riesgo de hipoglucemia aumenta durante el ayuno o cuando se saltea las comidas, durante o despus de realizar ejercicio intenso y mientras duerme. Los sntomas de hipoglucemia son:  Temblores o sacudidas.  Disminucin de la capacidad de concentracin.  Sudoracin.  Aumento de la frecuencia cardaca.  Dolor de cabeza.  Sequedad en la boca.  Hambre.  Irritabilidad.  Ansiedad.  Sueo agitado.  Alteracin del habla o de la coordinacin.  Confusin.  Tratar la hipoglucemia rpidamente. Si usted est alerta y puede tragar con seguridad, siga la regla de 15/15 que consiste en:  Tome entre 15 y 20gramos de glucosa de accin rpida o carbohidratos. Las opciones de accin rpida son un gel de glucosa, tabletas de glucosa, o 4 onzas (120 ml) de jugo de frutas, gaseosa comn, o leche baja en grasa.  Compruebe su nivel de glucosa en la sangre   15 minutos despus de tomar la glucosa.  Tome entre 15 y 20 gramos ms de glucosa si el nivel de glucosa en la sangre todava es de 70mg/dl o inferior.  Ingiera una comida o una colacin en el lapso de 1 hora una vez que los niveles de glucosa en la sangre vuelven a la normalidad.  Est atento a la poliuria (miccin excesiva) y la polidipsia (sensacin de mucha sed), que son los primeros signos de la hiperglucemia. El reconocimiento temprano de la hiperglucemia permite un tratamiento oportuno. Trate la hiperglucemia segn le indic su mdico.  Haga actividad fsica por lo menos 30minutos al da o como lo indique su mdico. Se recomienda que 30 minutos despus de cada comida, realice diez minutos  de actividad fsica para controlar los niveles de glucosa postprandial en la sangre.  Ajuste su dosis de insulina y la ingesta de alimentos, segn sea necesario, si inicia un nuevo ejercicio o deporte.  Siga su plan para los das de enfermedad cuando no pueda comer o beber como de costumbre.  Evite el tabaco y el alcohol.  Concurra a todas las visitas de control como se lo haya indicado el mdico.  Siga el consejo del mdico respecto a los controles prenatales y posteriores al parto (postparto), las visitas, la planificacin de las comidas, el ejercicio, los medicamentos, las vitaminas, los anlisis de sangre, otras pruebas mdicas y actividades fsicas.  Realice diariamente el cuidado de la piel y de los pies. Examine su piel y los pies diariamente para ver si tiene cortes, moretones, enrojecimiento, problemas en las uas, sangrado, ampollas o llagas.  Cepllese los dientes y encas por lo menos dos veces al da y use hilo dental al menos una vez por da. Concurra regularmente a las visitas de control con el dentista.  Programe un examen de vista durante el primer trimestre de su embarazo o como lo indique su mdico.  Comparta su plan de control de diabetes en el trabajo o en la escuela.  Mantngase al da con las vacunas.  Aprenda a manejar el estrs.  Obtenga la mayor cantidad posible de informacin sobre la diabetes y solicite ayuda siempre que sea necesario.  Obtenga informacin sobre el amamantamiento y analice esta posibilidad.  Debe controlar el nivel de azcar en la sangre de 6a 12semanas despus del parto. Esto se hace con una prueba de tolerancia a la glucosa oral (PTGO). SOLICITE ATENCIN MDICA SI:   No puede comer alimentos o beber por ms de 6 horas.  Tuvo nuseas o ha vomitado durante ms de 6 horas.  Tiene un nivel de glucosa en la sangre de 200 mg/dl y cetonas en la orina.  Presenta algn cambio en el estado mental.  Desarrolla problemas de visin.  Sufre  un dolor persistente de cabeza.  Siente dolor o molestias en la parte superior del abdomen.  Desarrolla una enfermedad grave adicional.  Tuvo diarrea durante ms de 6 horas.  Ha estado enfermo o ha tenido fiebre durante un par de das y no mejora. SOLICITE ATENCIN MDICA DE INMEDIATO SI:   Tiene dificultad para respirar.  Ya no siente los movimientos del beb.  Est sangrando o tiene flujo vaginal.  Comienza a tener contracciones o trabajo de parto prematuro. ASEGRESE DE QUE:  Comprende estas instrucciones.  Controlar su afeccin.  Recibir ayuda de inmediato si no mejora o si empeora.   Esta informacin no tiene como fin reemplazar el consejo del mdico. Asegrese de hacerle al mdico cualquier pregunta que tenga.     Document Released: 05/10/2005 Document Revised: 08/21/2014 Elsevier Interactive Patient Education 2016 [place] (Breastfeeding) Decidir Economist es una de las mejores elecciones que puede hacer por usted y su beb. El cambio hormonal durante el Media planner produce el desarrollo del tejido mamario y [place] la cantidad y el tamao de los conductos galactforos. Estas hormonas tambin permiten que las protenas, los azcares y las grasas de la sangre produzcan la Northeast Utilities materna en las glndulas productoras de [place]. Las hormonas impiden que la leche materna sea liberada antes del nacimiento del beb, adems de impulsar el flujo de leche luego del nacimiento. Una vez que ha comenzado a Economist, Freight forwarder beb, as Therapist, occupational succin o Social research officer, government, pueden estimular la liberacin de [place] de las glndulas productoras de [place].  LOS BENEFICIOS DE AMAMANTAR Para el beb  La primera leche (calostro) ayuda a Garment/textile technologist funcionamiento del sistema digestivo del beb.  La leche tiene anticuerpos que ayudan a Chemical engineer las infecciones en el beb.  El beb tiene una menor incidencia de asma, alergias y del sndrome de muerte sbita del lactante.  Los  nutrientes en la [place] materna son mejores para el beb que la [place] maternizada y estn preparados exclusivamente para cubrir las necesidades del beb.  La leche materna mejora el desarrollo cerebral del beb.  Es menos probable que el beb desarrolle otras enfermedades, como obesidad infantil, asma o diabetes mellitus de tipo 2. Para usted   La lactancia materna favorece el desarrollo de un vnculo muy especial entre la madre y el beb.  Es conveniente. La leche materna siempre est disponible a la Tree surgeon y es [place].  La lactancia materna ayuda a quemar caloras y a perder el peso ganado durante el [place].  Favorece la contraccin del tero al tamao que tena antes del embarazo de manera ms rpida y disminuye el sangrado (loquios) despus del parto.  La lactancia materna contribuye a reducir Catering manager de desarrollar diabetes mellitus de tipo 2, osteoporosis o cncer de mama o de ovario en el futuro. SIGNOS DE QUE EL BEB EST HAMBRIENTO Primeros signos de hambre  Aumenta su estado de [place].  Se estira.  Mueve la cabeza de un lado a otro.  Mueve la cabeza y abre la boca cuando se le toca la mejilla o la comisura de la boca (reflejo de bsqueda).  [place] vocalizaciones, tales como sonidos de succin, se relame los labios, emite arrullos, suspiros, o chirridos.  Mueve la [employer] boca.  Se chupa con ganas los dedos o las manos. Signos tardos de [employer].  Llora de manera intermitente. Signos de [employer]oles[employer] signos de hambre extrema requerirn que lo calme y lo consuele antes de que el beb pueda alimentarse adecuadamente. No espere a que se manifiesten los siguientes signos de hambre extrema para comenzar a Economist:   Air cabin crew.  Llanto intenso y fuerte.  Gritos. INFORMACIN BSICA SOBRE LA LACTANCIA MATERNA Iniciacin de la lactancia materna  Encuentre un lugar cmodo para sentarse o acostarse, con un  buen respaldo para el cuello y la espalda.  Coloque una almohada o una manta enrollada debajo del beb para acomodarlo a la altura de la mama (si est sentada). Las almohadas para Economist se han diseado especialmente a fin de servir de apoyo para los brazos y el beb Kellogg.  Asegrese de que el abdomen del beb est frente al suyo.   Masajee suavemente la mama. Con las yemas  de los dedos, masajee la pared del pecho hacia el pezn en un movimiento circular. Esto estimula el flujo de [place]. Es posible que Oceanographer este movimiento mientras amamanta si la leche fluye lentamente.  Sostenga la mama con el pulgar por arriba del pezn y los otros 4 dedos por debajo de la mama. Asegrese de que los dedos se encuentren lejos del pezn y de la boca del beb.  Empuje suavemente los labios del beb con el pezn o con el dedo.  Cuando la boca del beb se abra lo suficiente, acrquelo rpidamente a la mama e introduzca todo el pezn y la zona oscura que lo rodea (areola), tanto como sea posible, dentro de la boca del beb.  Debe haber ms areola visible por arriba del labio superior del beb que por debajo del labio inferior.  La lengua del beb debe estar entre la enca inferior y la [place].  Asegrese de que la boca del beb est en la posicin correcta alrededor del pezn (prendida). Los labios del beb deben crear un sello sobre la mama y estar doblados hacia afuera (invertidos).  Es comn que el beb succione durante 2 a 3 minutos para que comience el flujo de [place]. Cmo debe prenderse Es muy importante que le ensee al beb cmo prenderse adecuadamente a la mama. Si el beb no se prende adecuadamente, puede causarle dolor en el pezn y reducir la produccin de [place], y hacer que el beb tenga un escaso aumento de [place]. Adems, si el beb no se prende adecuadamente al pezn, puede tragar aire durante la alimentacin. Esto puede causarle molestias al beb. Hacer eructar  al beb al Eliezer Lofts de mama puede ayudarlo a liberar el aire. Sin embargo, ensearle al beb cmo prenderse a la mama adecuadamente es la mejor manera de evitar que se sienta molesto por tragar Administrator, sports se alimenta. Signos de que el beb se ha prendido adecuadamente al pezn:   Hall Busing o succiona de modo silencioso, sin causarle dolor.  Se escucha que traga cada 3 o 4 succiones.  Hay movimientos musculares por arriba y por delante de sus odos al Mining engineer. Signos de que el beb no se ha prendido Product manager al pezn:   Hace ruidos de succin o de chasquido mientras se alimenta.  Siente dolor en el pezn. Si cree que el beb no se prendi correctamente, deslice el dedo en la comisura de la boca y Mic[employer] Technology las encas del beb para interrumpir la succin. Intente comenzar a amamantar nuevamente. Signos de Economist Signos del beb:   Disminuye gradualmente el nmero de succiones o cesa la succin por completo.  Se duerme.  Relaja el cuerpo.  Retiene una pequea cantidad de [employer] Corporation boca.  Se desprende solo del pecho. Signos que presenta usted:  Las mamas han aumentado la firmeza, el peso y el tamao 1 a 3 horas despus de Economist.  Estn ms blandas inmediatamente despus de amamantar.  Un aumento del volumen de [place], y tambin un cambio en su consistencia y color se producen hacia el [place] de Therapist, nutritional.  Los pezones no duelen, ni estn agrietados ni sangran. Signos de que su beb recibe la cantidad de leche suficiente  Moja al menos 3 paales en 24 horas. La orina debe ser clara y de color amarillo plido a los [place].  Defeca al menos 3 veces en 24 horas a los 5 das de vida. La materia fecal debe ser  blanda y Designer, fashion/clothing.  Defeca al menos 3 veces en 24 horas a los 7 das de vida. La materia fecal debe ser grumosa y [place].  No registra una prdida de peso mayor del 10% del peso al nacer durante los primeros [place].  Aumenta de peso un promedio de 4 a 7onzas (113 a 198g) por semana despus de los [place].  Aumenta de [place], [place], de [place] uniforme a Proofreader de los 5 das de vida, sin Museum/gallery curator prdida de peso despus de las 2semanas de vida. Despus de alimentarse, es posible que el beb regurgite una pequea cantidad. Esto es frecuente. FRECUENCIA Y DURACIN DE LA LACTANCIA MATERNA El amamantamiento frecuente la ayudar a producir ms [place] y a Warden/ranger de Social research officer, government en los pezones e hinchazn en las [place]. Alimente al beb cuando muestre signos de hambre o si siente la necesidad de reducir la congestin de las [place]. Esto se denomina "lactancia a demanda". Evite el uso del chupete mientras trabaja para establecer la lactancia (las primeras 4 a 6 semanas despus del nacimiento del beb). Despus de este perodo, podr ofrecerle un chupete. Las investigaciones demostraron que el uso del chupete durante el primer ao de vida del beb disminuye el riesgo de desarrollar el sndrome de muerte sbita del lactante (SMSL). Permita que el nio se alimente en cada mama todo lo que desee. Contine amamantando al beb hasta que haya terminado de alimentarse. Cuando el beb se desprende o se queda dormido mientras se est alimentando de la primera mama, ofrzcale la segunda. Debido a que, con frecuencia, los recin [employer] las primeras semanas de vida, es posible que deba despertar al beb para alimentarlo. Los horarios de Writer de un beb a otro. Sin embargo, las siguientes reglas pueden servir como gua para ayudarla a Engineer, materials que el beb se alimenta adecuadamente:  Se puede amamantar a los recin nacidos (bebs de 4 semanas o menos de vida) cada 1 a 3 horas.  No deben transcurrir ms de 3 horas durante el da o 5 horas durante la noche sin que se amamante a los recin nacidos.  Debe amamantar al beb 8 veces como mnimo en un perodo de 24 horas,  hasta que comience a introducir slidos en su dieta, a los 6 meses de vida aproximadamente. [place] extraccin y Recruitment consultant de la leche materna le permiten asegurarse de que el beb se alimente exclusivamente de [place], aun en momentos en los que no puede amamantar. Esto tiene especial importancia si debe regresar al [place] en el perodo en que an est amamantando o si no puede estar presente en los momentos en que el beb debe alimentarse. Su asesor en lactancia puede orientarla sobre cunto tiempo es [place].  El sacaleche es un aparato que le permite extraer leche de la mama a un recipiente estril. Luego, la leche materna extrada puede almacenarse en un refrigerador o Pension scheme manager. Algunos sacaleches son Theodore Demark, James Ivanoff otros son elctricos. Consulte a su asesor en lactancia qu tipo ser ms conveniente para usted. Los sacaleches se pueden comprar; sin embargo, algunos hospitales y grupos de apoyo a la lactancia materna alquilan Production assistant, radio. Un asesor en lactancia puede ensearle cmo extraer [employer]Max Incorporated, en caso de que prefiera no usar un sacaleche.  CMO CUIDAR LAS MAMAS DURANTE LA LACTANCIA MATERNA Los pezones se secan, agrietan y duelen durante la Therapist, nutritional. Las siguientes recomendaciones pueden ayudarla a  mantener las [employer] y sanas:  Evite usar [employer].  Use un sostn de soporte. Aunque no son esenciales, las camisetas sin mangas o los sostenes especiales para Economist estn diseados para acceder fcilmente a las mamas, para Economist sin tener que quitarse todo el sostn o la camiseta. Evite usar sostenes con aro o sostenes muy ajustados.  Seque al aire sus pezones durante 3 a 12minutos despus de amamantar al beb.  Utilice solo apsitos de Chiropodist sostn para Tax adviser las prdidas de [place]. La prdida de un poco de Owens [employer]ning tomas es  normal.  Utilice lanolina sobre los pezones luego de Economist. La lanolina ayuda a mantener la humedad normal de la piel. Si [place] lanolina pura, no tiene que lavarse los pezones antes de volver a Research scientist (life sciences) al beb. La lanolina pura no es txica para el beb. Adems, puede extraer Cisco algunas gotas de [place] materna y Community education officer suavemente esa [employer], para que la [place] se seque al aire. Durante las primeras semanas despus de dar a luz, algunas mujeres pueden experimentar hinchazn en las mamas (congestin [place]). La congestin puede hacer que sienta las mamas pesadas, calientes y sensibles al tacto. El pico de la congestin ocurre dentro de los 3 a 5 das despus del [place]. Las siguientes recomendaciones pueden ayudarla a Public house manager la congestin:  Vace por completo las mamas al [place]. Puede aplicar calor hmedo en las mamas (en la ducha o con toallas hmedas para manos) antes de Economist o extraer Northeast Utilities. Esto aumenta la circulacin y [place] a que la [place]. Si el beb no vaca por completo las mamas cuando lo amamanta, extraiga la [place] restante despus de que haya finalizado.  Use un sostn ajustado (para amamantar o comn) o una camiseta sin mangas durante 1 o 2 das para indicar al cuerpo que disminuya ligeramente la produccin de [place].  Aplique compresas de hielo [employer], a menos que le resulte demasiado incmodo.  Asegrese de que el beb est prendido y se encuentre en la posicin correcta mientras lo alimenta. Si la congestin persiste luego de 48 horas o despus de seguir estas recomendaciones, comunquese con su mdico o un Lobbyist. RECOMENDACIONES GENERALES PARA EL CUIDADO DE LA SALUD DURANTE LA LACTANCIA MATERNA  Consuma alimentos saludables. [place]ne comidas y colaciones, y coma 3 de cada una por da. Dado que lo que come So[employer]ron Corporation, es posible que algunas comidas hagan que su beb se vuelva ms irritable de  lo habitual. Evite comer este tipo de alimentos si percibe que afectan de manera negativa al beb.  Beba leche, jugos de fruta y agua para Engineer, water su sed (aproximadamente [place]).  Descanse con frecuencia, reljese y tome sus vitaminas prenatales para evitar la fatiga, el estrs y la anemia.  Contine con los autocontroles de la mama.  Evite Engineer, manufacturing systems y fumar tabaco. Las sustancias qumicas de los cigarrillos que pasan a la leche materna y la exposicin al humo ambiental del tabaco pueden daar al beb.  No consuma alcohol ni drogas, incluida la marihuana. Algunos medicamentos, que pueden ser perjudiciales para el beb, pueden pasar a travs de la SL[employer] Corporation. Es importante que consulte a su mdico antes de Medical sales representative, incluidos todos los medicamentos recetados y de [place], as como los suplementos vitamnicos y herbales. Puede quedar embarazada durante la lactancia. Si desea controlar la natalidad, consulte a su mdico cules son  las opciones ms seguras para el beb. SOLICITE ATENCIN MDICA SI:   Usted siente que quiere dejar de Economist o se siente frustrada con la lactancia.  Siente dolor en las mamas o en los pezones.  Sus pezones estn agrietados o Control and instrumentation engineer.  Sus pechos estn irritados, sensibles o calientes.  Tiene un rea hinchada en cualquiera de las mamas.  Siente escalofros o fiebre.  Tiene nuseas o vmitos.  Presenta una secrecin de otro lquido distinto de la leche materna de los pezones.  Sus mamas no se llenan antes de Economist al beb para el quinto da despus del [place].  Se siente triste y deprimida.  El beb est demasiado somnoliento como para comer bien.  El beb tiene problemas para dormir.  Moja menos de 3 paales en 24 horas.  Defeca menos de 3 veces en 24 horas.  La piel del beb o la parte blanca de los ojos se vuelven amarillentas.  El beb no ha aumentado de [place] a los [place]. SOLICITE ATENCIN  MDICA DE INMEDIATO SI:   El beb est muy cansado Engineer, manufacturing) y no se quiere despertar para comer.  Le sube la fiebre sin causa.   Esta informacin no tiene Marine scientist el consejo del mdico. Asegrese de hacerle al mdico cualquier pregunta que tenga.   Document Released: 07/31/2005 Document Revised: 04/21/2015 Elsevier Interactive Patient Education [employer].

## 2015-08-10 NOTE — Progress Notes (Signed)
Interpreter Anastasio Auerbach present for encounter.  Review of CBG log shows many elevated values. Dr. Kennon Rounds called to see pt for evaluation.

## 2015-08-10 NOTE — Progress Notes (Signed)
Subjective:  Morgan Ramsey is a 39 y.o. [id number] at [redacted]w[redacted]d being seen today for ongoing prenatal care.  She is currently monitored for the following issues for this high-risk pregnancy and has Language barrier; AMA (advanced maternal age) multigravida 35+; H/O herpes simplex type 2 infection; Previous cesarean section; LGA (large for gestational age) fetus; Obesity; Anemia; Gestational diabetes mellitus, antepartum; and Supervision of high-risk pregnancy on her problem list.  Patient reports no complaints.   .  .  Movement: Present. Denies leaking of fluid.   The following portions of the patient's history were reviewed and updated as appropriate: allergies, current medications, past family history, past medical history, past social history, past surgical history and problem list. Problem list updated.  Objective:   Filed Vitals:   08/10/15 1322  BP: 120/63  Pulse: 89    Fetal Status: Fetal Heart Rate (bpm): NST   Movement: Present     General:  Alert, oriented and cooperative. Patient is in no acute distress.  Skin: Skin is warm and dry. No rash noted.   Cardiovascular: Normal heart rate noted  Respiratory: Normal respiratory effort, no problems with respiration noted  Abdomen: Soft, gravid, appropriate for gestational age.       Extremities: Normal range of motion.     Mental Status: Normal mood and affect. Normal behavior. Normal judgment and thought content.  FBS 66-110 1 in range 2 hour 63-156 almost all are out of range NST reviewed and reactive. Assessment and Plan:  Pregnancy: [id number] at [redacted]w[redacted]d  1. AMA (advanced maternal age) multigravida 40+, third trimester - Fetal nonstress test  2. Gestational diabetes mellitus, antepartum Add Glucophage to Glyburide, as glycemic control remains suboptimal. - Fetal nonstress test - metFORMIN (GLUCOPHAGE) 500 MG tablet; Take 1 tablet (500 mg total) by mouth 2 (two) times daily with a meal.  Dispense: 60 tablet; Refill: 2  3.  Supervision of high-risk pregnancy, third trimester Continue prenatal care  4. Previous cesarean section For TOLAC, but may change if remains breech or growth is very large.  Term labor symptoms and general obstetric precautions including but not limited to vaginal bleeding, contractions, leaking of fluid and fetal movement were reviewed in detail with the patient. Please refer to After Visit Summary for other counseling recommendations.  Return in 6 days (on 08/16/2015) for as scheduled.   Donnamae Jude, MD

## 2015-08-12 ENCOUNTER — Other Ambulatory Visit (HOSPITAL_COMMUNITY): Payer: Self-pay | Admitting: Maternal and Fetal Medicine

## 2015-08-12 ENCOUNTER — Ambulatory Visit (HOSPITAL_COMMUNITY)
Admission: RE | Admit: 2015-08-12 | Discharge: 2015-08-12 | Disposition: A | Discharge status: Home / Self Care | Payer: Self-pay | Source: Ambulatory Visit | Attending: Family Medicine | Admitting: Family Medicine

## 2015-08-12 ENCOUNTER — Encounter (HOSPITAL_COMMUNITY): Payer: Self-pay

## 2015-08-12 ENCOUNTER — Other Ambulatory Visit: Payer: Self-pay | Admitting: Obstetrics & Gynecology

## 2015-08-12 DIAGNOSIS — O34219 Maternal care for unspecified type scar from previous cesarean delivery: Secondary | ICD-10-CM

## 2015-08-12 DIAGNOSIS — O24419 Gestational diabetes mellitus in pregnancy, unspecified control: Secondary | ICD-10-CM

## 2015-08-12 DIAGNOSIS — Z3A36 36 weeks gestation of pregnancy: Secondary | ICD-10-CM

## 2015-08-12 DIAGNOSIS — O09522 Supervision of elderly multigravida, second trimester: Secondary | ICD-10-CM | POA: Insufficient documentation

## 2015-08-12 DIAGNOSIS — O09523 Supervision of elderly multigravida, third trimester: Secondary | ICD-10-CM

## 2015-08-12 DIAGNOSIS — O99213 Obesity complicating pregnancy, third trimester: Secondary | ICD-10-CM

## 2015-08-15 DIAGNOSIS — O24419 Gestational diabetes mellitus in pregnancy, unspecified control: Secondary | ICD-10-CM

## 2015-08-15 HISTORY — DX: Gestational diabetes mellitus in pregnancy, unspecified control: O24.419

## 2015-08-15 NOTE — L&D Delivery Note (Signed)
Delivery Note At 1:38 PM a viable female was delivered via Vaginal, Spontaneous Delivery (Presentation:vertex ; LOA Occiput Anterior).  APGAR: 9, 9; weight  .   Placenta status: Intact, Pathology.  Cord: 3 vessels with the following complications: None.  Cord pH: n/a  Anesthesia: Epidural  Episiotomy: None Lacerations: 2nd degree Suture Repair: 3.0 vicryl rapide Est. Blood Loss 50 (mL):    Mom to postpartum.  Baby to Couplet care / Skin to Skin.  LAWSON, MARIE DARLENE 08/29/2015, 1:50 PM

## 2015-08-16 ENCOUNTER — Ambulatory Visit (INDEPENDENT_AMBULATORY_CARE_PROVIDER_SITE_OTHER): Payer: Self-pay | Admitting: Obstetrics & Gynecology

## 2015-08-16 ENCOUNTER — Encounter: Payer: Self-pay | Admitting: Obstetrics & Gynecology

## 2015-08-16 VITALS — BP 109/68 | HR 98 | Wt 230.8 lb

## 2015-08-16 DIAGNOSIS — O09523 Supervision of elderly multigravida, third trimester: Secondary | ICD-10-CM

## 2015-08-16 DIAGNOSIS — O0993 Supervision of high risk pregnancy, unspecified, third trimester: Secondary | ICD-10-CM

## 2015-08-16 DIAGNOSIS — O24419 Gestational diabetes mellitus in pregnancy, unspecified control: Secondary | ICD-10-CM

## 2015-08-16 LAB — POCT URINALYSIS DIP (DEVICE)
Bilirubin Urine: NEGATIVE
GLUCOSE, UA: NEGATIVE mg/dL
HGB URINE DIPSTICK: NEGATIVE
Ketones, ur: NEGATIVE mg/dL
NITRITE: NEGATIVE
Protein, ur: NEGATIVE mg/dL
Specific Gravity, Urine: 1.02 (ref 1.005–1.030)
UROBILINOGEN UA: 0.2 mg/dL (ref 0.0–1.0)
pH: 5.5 (ref 5.0–8.0)

## 2015-08-16 MED ORDER — GLYBURIDE 5 MG PO TABS: ORAL_TABLET | ORAL | Status: DC

## 2015-08-16 NOTE — Patient Instructions (Signed)
Parto vaginal (Vaginal Delivery) Durante el parto, el mdico la ayudar a dar a luz a su beb. En elparto vaginal, deber pujar para que el beb salga por la vagina. Sin embargo, antes de que pueda sacar al beb, es necesario que ocurran ciertas cosas. La abertura del tero (cuello del tero) tiene que ablandarse, hacerse ms delgado y abrirse (dilatar) hasta que llegue a 10 cm. Adems, el beb tiene que bajar desde el tero a la vagina. SIGNOS DE TRABAJO DE PARTO  El mdico tendr primero que asegurarse de que usted est en trabajo de parto. Algunos signos son:   Eliminar lo que se llama tapn mucoso antes del inicio del trabajo de parto. Este es una pequea cantidad de mucosidad teida con sangre.  Tener contracciones uterinas regulares y dolorosas.   El tiempo entre las contracciones debe acortarse  Las molestias y el dolor se harn ms intensos gradualmente.  El dolor de las contracciones empeora al caminar y no se alivia con el reposo.   El cuello del tero se hace mas delgado (se borra) y se dilata. ANTES DEL PARTO Una vez que se inicie el trabajo de parto y sea admitida en el hospital o sanatorio, el mdico podr hacer lo siguiente:   Realizar un examen fsico.  Controlar si hay complicaciones relacionadas con el trabajo de parto.  Verificar su presin arterial, temperatura y pulso y la frecuencia cardaca (signos vitales).   Determinar si se ha roto el saco amnitico y cundo ha ocurrido.  Realizar un examen vaginal (utilizando un guante estril y un lubricante) para determinar:  La posicin (presentacin) del beb. El beb se presenta con la cabeza primero (vertex) en el canal de parto (vagina), o estn los pies o las nalgas primero (de nalgas)?  El nivel (estacin) de la cabeza del beb dentro del canal de parto.  El borramiento y la dilatacin del cuello uterino  El monitor fetal electrnico generalmente se coloca sobre el abdomen al llegar. Se utiliza para  controlar las contracciones y la frecuencia cardaca del beb.  Cuando el monitor est en el abdomen (monitor fetal externo), slo toma la frecuencia y la duracin de las contracciones. No informa acerca de la intensidad de las contracciones.  Si el mdico necesita saber exactamente la intensidad de las contracciones o cul es la frecuencia cardaca del beb, colocar un monitor interno en la vagina y el tero. El mdico comentar los riesgos y los beneficios de usar un monitor interno y le pedir autorizacin antes de colocar el dispositivo.  El monitoreo fetal continuo ser necesario si le han aplicado una epidural, si le administran ciertos medicamentos (como oxitocina) y si tiene complicaciones del embarazo o del trabajo de parto.  Podrn colocarle una va intravenosa en una vena del brazo para suministrarle lquidos y medicamentos, si es necesario. TRES ETAPAS DEL TRABAJO DE PARTO Y EL PARTO El trabajo de parto y el parto normales se dividen en tres etapas. Primera etapa Esta etapa comienza cuando comienzan las contracciones regulares y el cuello comienza a borrarse y dilatarse. Finaliza cuando el cuello est completamente abierto (completamente dilatado). La primera etapa es la etapa ms larga del trabajo de parto y puede durar desde 3 horas a 15 horas.  Algunos mtodos estn disponibles para ayudar con el dolor del parto. Usted y su mdico decidirn qu opcin es la mejor para usted. Las opciones incluyen:   Medicamentos narcticos. Estos son medicamentos fuertes que usted puede recibir a travs de una va intravenosa o   como inyeccin en el msculo. Estos medicamentos Financial trader pero no hacen que desaparezca completamente.  Epidural. Se administra un medicamento a travs de un tubo delgado que se inserta en la espalda. El medicamento adormece la parte inferior del cuerpo y evita el dolor en esa zona.  Bloqueo paracervical Es una inyeccin de un anestsico en cada lado del cuello  uterino.  Usted podr pedir un parto natural, que implica que no se usen analgsicos ni epidural durante el parto y el trabajo de parto. En cambio, podr tener otro tipo de ayuda como ejercicios respiratorios para hacer frente al [employer]. Segunda etapa La segunda etapa del trabajo de parto comienza cuando el cuello se ha dilatado completamente a 10 cm. Contina hasta que usted puja al beb hacia abajo, por el canal de [place], y el beb nace. Esta etapa puede durar slo algunos minutos o algunas horas.  La posicin del la [place] del beb a medida que pasa por el canal de parto, es informada como un nmero, llamado estacin. Si la cabeza del beb no ha iniciado su descenso, la estacin se describe como que est en menos 3 (-3). Cuando la cabeza del beb est en la estacin cero, est en el medio del canal de parto y se encaja en la pelvis. La estacin en la que se encuentra el beb indica el progreso de la segunda etapa del [place].  Cuando el beb nace, el mdico lo sostendr con la cabeza hacia abajo para evitar que el lquido amnitico, el moco y la sangre entren en los pulmones del beb. La boca y la nariz del beb podrn ser succionadas con un pequeo bulbo para retirar todo lquido [address] mdico podr Glass blower/designer al beb sobre su estmago. Es importante evitar que el beb tome fro. Para hacerlo, el mdico secar al beb, lo colocar directamente sobre su piel, (sin mantas entre usted y el beb) y lo cubrir con mantas secas y tibias.  Se corta el cordn umbilical. Tercera etapa Durante la tercera etapa del trabajo de parto, el mdico sacar la placenta (alumbramiento) y se asegurar de que el sangrado est controlado. La salida de la placenta generalmente demora 5 minutos pero puede tardar hasta 30 minutos. Luego de la salida de la placenta, le darn un medicamento por va intravenosa o inyectable para ayudar a Chemical engineer tero y Geneticist, molecular. Si planea amamantar al beb,  puede intentar en este momento Luego de la salida de la placenta, el tero debe contraerse y [place]. Si el tero no queda firme, el mdico lo Community education officer. Esto es importante debido a que la contraccin del tero ayuda a Oceanographer sangrado en el sitio en que la placenta estaba unida al tero. Si el tero no se contrae adecuadamente ni [employer], podr causar un sangrado abundante. Si hay mucho sangrado, podrn darle medicamentos para contraer el tero y Sales promotion account executive.    Esta informacin no tiene Marine scientist el consejo del mdico. Asegrese de hacerle al mdico cualquier pregunta que tenga.   Document Released: 07/13/2008 Document Revised: 08/21/2014 Elsevier Interactive Patient Education [employer].

## 2015-08-16 NOTE — Progress Notes (Signed)
Interpreter Lockie Mola present for encounter. [place] for growth done 12/29 - breech presentation then however bedside US today confirms vertex presentation.

## 2015-08-16 NOTE — Progress Notes (Signed)
fbs 84-103 and pp up to [place]  Morgan Ramsey is a 40 y.o. [id number] at [redacted]w[redacted]d being seen today for ongoing prenatal care.  She is currently monitored for the following issues for this high-risk pregnancy and has Language barrier; AMA (advanced maternal age) multigravida 35+; H/O herpes simplex type 2 infection; Previous cesarean section; LGA (large for gestational age) fetus; Obesity; Anemia; Gestational diabetes mellitus, antepartum; and Supervision of high-risk pregnancy on her problem list.  Patient reports occasional contractions.  Contractions: Not present. Vag. Bleeding: None.  Movement: Present. Denies leaking of fluid.   The following portions of the patient's history were reviewed and updated as appropriate: allergies, current medications, past family history, past medical history, past social history, past surgical history and problem list. Problem list updated.  Objective:   Filed Vitals:   08/16/15 1016  BP: 109/68  Pulse: 98  Weight: 230 lb 12.8 oz (104.69 kg)    Fetal Status: Fetal Heart Rate (bpm): NST   Movement: Present     General:  Alert, oriented and cooperative. Patient is in no acute distress.  Skin: Skin is warm and dry. No rash noted.   Cardiovascular: Normal heart rate noted  Respiratory: Normal respiratory effort, no problems with respiration noted  Abdomen: Soft, gravid, appropriate for gestational age. Pain/Pressure: Present     Pelvic: Vag. Bleeding: None     Cervical exam deferred        Extremities: Normal range of motion.  Edema: None  Mental Status: Normal mood and affect. Normal behavior. Normal judgment and thought content.   Urinalysis:      Assessment and Plan:  Pregnancy: [id number] at [redacted]w[redacted]d  1. AMA (advanced maternal age) multigravida 47+, third trimester NST reactive today  2. Gestational diabetes mellitus, antepartum >90 %ile on [place], previous LGA delivery  3. Supervision of high-risk pregnancy, third trimester PP  BG out of range, will increase glyburide to 10 mg am  5 mg HS  Term labor symptoms and general obstetric precautions including but not limited to vaginal bleeding, contractions, leaking of fluid and fetal movement were reviewed in detail with the patient. Please refer to After Visit Summary for other counseling recommendations.  Return in about 4 days (around 08/20/2015) for 2x/wk as scheduled.   Woodroe Mode, MD

## 2015-08-17 ENCOUNTER — Encounter (HOSPITAL_COMMUNITY): Payer: Self-pay

## 2015-08-20 ENCOUNTER — Telehealth (HOSPITAL_COMMUNITY): Payer: Self-pay | Admitting: *Deleted

## 2015-08-20 ENCOUNTER — Ambulatory Visit (INDEPENDENT_AMBULATORY_CARE_PROVIDER_SITE_OTHER): Payer: Self-pay | Admitting: *Deleted

## 2015-08-20 ENCOUNTER — Encounter (HOSPITAL_COMMUNITY): Payer: Self-pay | Admitting: *Deleted

## 2015-08-20 VITALS — BP 107/59 | HR 93

## 2015-08-20 DIAGNOSIS — O09523 Supervision of elderly multigravida, third trimester: Secondary | ICD-10-CM

## 2015-08-20 DIAGNOSIS — O24419 Gestational diabetes mellitus in pregnancy, unspecified control: Secondary | ICD-10-CM

## 2015-08-20 DIAGNOSIS — Z36 Encounter for antenatal screening of mother: Secondary | ICD-10-CM

## 2015-08-20 NOTE — Progress Notes (Signed)
Patient has breech presentation. Discussed options with patient: Either attempting ECV on Monday versus having a repeat cesarean section in 1 week. Patient opted to attempt ECV. I did discuss that in attempting the ECV, and is highly possible that it will not succeed given the patient's late gestational age and macrosomic baby. Will arrange. Patient told to arrive nothing by mouth.  NST reactive.

## 2015-08-20 NOTE — Progress Notes (Signed)
Pt reports decreased FM since yesterday afternoon. Pt felt good FM during NST.  Dr. Nehemiah Settle in to discuss plan for delivery. [place] interpreter # [phone] used for encounter.

## 2015-08-20 NOTE — Telephone Encounter (Signed)
[phone] interpreter number

## 2015-08-20 NOTE — Telephone Encounter (Signed)
Preadmission screen  

## 2015-08-23 ENCOUNTER — Observation Stay (HOSPITAL_COMMUNITY)
Admission: RE | Admit: 2015-08-23 | Discharge: 2015-08-23 | Disposition: A | Discharge status: Home / Self Care | Payer: Self-pay | Source: Ambulatory Visit | Attending: Family Medicine | Admitting: Family Medicine

## 2015-08-23 ENCOUNTER — Other Ambulatory Visit: Payer: Self-pay

## 2015-08-23 DIAGNOSIS — Z538 Procedure and treatment not carried out for other reasons: Secondary | ICD-10-CM | POA: Insufficient documentation

## 2015-08-23 DIAGNOSIS — Z3A00 Weeks of gestation of pregnancy not specified: Secondary | ICD-10-CM | POA: Insufficient documentation

## 2015-08-23 DIAGNOSIS — O321XX0 Maternal care for breech presentation, not applicable or unspecified: Principal | ICD-10-CM | POA: Insufficient documentation

## 2015-08-23 NOTE — Progress Notes (Signed)
1/6 NST reviewed and reactive

## 2015-08-23 NOTE — H&P (Signed)
Patient Name: Morgan Ramsey, female   DOB: Sep 16, 1975, 40 y.o.  MRN: [id number]   Patient was seen on L&D for ECV.  US done when pt arrived and fetal position confirmed to be vertex.  Patient sent home without procedure.  Truett Mainland, DO  08/23/2015 11:13 PM

## 2015-08-23 NOTE — Progress Notes (Signed)
Patient ID: Morgan Ramsey, female   DOB: May 08, 1976, 40 y.o.   MRN: [id number] Pt in for version for breech presentation. No complaints. O: VSS, FHR pattern reassurring, vertex confirmed by [place]. A: spont version. Now vertex P: d/c home

## 2015-08-23 NOTE — Discharge Instructions (Signed)
Evaluación de los movimientos fetales  °(Fetal Movement Counts) °Nombre del paciente: __________________________________________________ Fecha de parto estimada: ____________________ °La evaluación de los movimientos fetales es muy recomendable en los embarazos de alto riesgo, pero también es una buena idea que lo hagan todas las embarazadas. El médico le indicará que comience a contarlos a las 28 semanas de embarazo. Los movimientos fetales suelen aumentar:  °· Después de una comida completa. °· Después de la actividad física. °· Después de comer o beber algo dulce o frío. °· En reposo. °Preste atención cuando sienta que el bebé está más activo. Esto le ayudará a notar un patrón de ciclos de vigilia y sueño de su bebé y cuáles son los factores que contribuyen a un aumento de los movimientos fetales. Es importante llevar a cabo un recuento de movimientos fetales, al mismo tiempo cada día, cuando el bebé normalmente está más activo.  °CÓMO CONTAR LOS MOVIMIENTOS FETALES °1. Busque un lugar tranquilo y cómodo para sentarse o recostarse sobre el lado izquierdo. Al recostarse sobre su lado izquierdo, le proporciona una mejor circulación de sangre y oxígeno al bebé. °2. Anote el día y la hora en una hoja de papel o en un diario. °3. Comience contando las pataditas, revoloteos, chasquidos, vueltas o pinchazos en un período de 2 horas. Debe sentir al menos 10 movimientos en 2 horas. °4. Si no siente 10 movimientos en 2 horas, espere 2 ó 3 horas y cuente de nuevo. Busque cambios en el patrón o si no cuenta lo suficiente en 2 horas. °SOLICITE ATENCIÓN MÉDICA SI:  °· Siente menos de 10 pataditas en 2 horas, en dos intentos. °· No hay movimientos durante una hora. °· El patrón se modifica o le lleva más tiempo cada día contar las 10 pataditas. °· Siente que el bebé no se mueve como lo hace habitualmente. °Fecha: ____________ Movimientos: ____________ Hora de inicio: ____________ Hora de finalización: ____________  °Fecha:  ____________ Movimientos: ____________ Hora de inicio: ____________ Hora de finalización: ____________  °Fecha: ____________ Movimientos: ____________ Hora de inicio: ____________ Hora de finalización: ____________  °Fecha: ____________ Movimientos: ____________ Hora de inicio: ____________ Hora de finalización: ____________  °Fecha: ____________ Movimientos: ____________ Hora de inicio: ____________ Hora de finalización: ____________  °Fecha: ____________ Movimientos: ____________ Hora de inicio: ____________ Hora de finalización: ____________  °Fecha: ____________ Movimientos: ____________ Hora de inicio: ____________ Hora de finalización: ____________  °Fecha: ____________ Movimientos: ____________ Hora de inicio: ____________ Hora de finalización: ____________  °Fecha: ____________ Movimientos: ____________ Hora de inicio: ____________ Hora de finalización: ____________  °Fecha: ____________ Movimientos: ____________ Hora de inicio: ____________ Hora de finalización: ____________  °Fecha: ____________ Movimientos: ____________ Hora de inicio: ____________ Hora de finalización: ____________  °Fecha: ____________ Movimientos: ____________ Hora de inicio: ____________ Hora de finalización: ____________  °Fecha: ____________ Movimientos: ____________ Hora de inicio: ____________ Hora de finalización: ____________  °Fecha: ____________ Movimientos: ____________ Hora de inicio: ____________ Hora de finalización: ____________  °Fecha: ____________ Movimientos: ____________ Hora de inicio: ____________ Hora de finalización: ____________  °Fecha: ____________ Movimientos: ____________ Hora de inicio: ____________ Hora de finalización: ____________  °Fecha: ____________ Movimientos: ____________ Hora de inicio: ____________ Hora de finalización: ____________  °Fecha: ____________ Movimientos: ____________ Hora de inicio: ____________ Hora de finalización: ____________  °Fecha: ____________ Movimientos: ____________ Hora  de inicio: ____________ Hora de finalización: ____________  °Fecha: ____________ Movimientos: ____________ Hora de inicio: ____________ Hora de finalización: ____________  °Fecha: ____________ Movimientos: ____________ Hora de inicio: ____________ Hora de finalización: ____________  °Fecha: ____________ Movimientos: ____________ Hora de inicio: ____________ Hora de   finalizacin: ____________  Toma Copier: ____________ Movimientos: ____________ Eda Paschal inicio: ____________ Eda Paschal finalizacin: ____________  Toma Copier: ____________ Movimientos: ____________ Eda Paschal inicio: ____________ Eda Paschal finalizacin: ____________  Toma Copier: ____________ Movimientos: ____________ Eda Paschal inicio: ____________ Eda Paschal finalizacin: ____________  Toma Copier: ____________ Movimientos: ____________ Eda Paschal inicio: ____________ Eda Paschal finalizacin: ____________  Toma Copier: ____________ Movimientos: ____________ Eda Paschal inicio: ____________ Mellody Drown de finalizacin: ____________  Toma Copier: ____________ Movimientos: ____________ Mellody Drown de inicio: ____________ Mellody Drown de finalizacin: ____________  Toma Copier: ____________ Movimientos: ____________ Mellody Drown de inicio: ____________ Mellody Drown de finalizacin: ____________  Toma Copier: ____________ Movimientos: ____________ Mellody Drown de inicio: ____________ Mellody Drown de finalizacin: ____________  Toma Copier: ____________ Movimientos: ____________ Mellody Drown de inicio: ____________ Mellody Drown de finalizacin: ____________  Toma Copier: ____________ Movimientos: ____________ Mellody Drown de inicio: ____________ Mellody Drown de finalizacin: ____________  Toma Copier: ____________ Movimientos: ____________ Mellody Drown de inicio: ____________ Mellody Drown de finalizacin: ____________  Toma Copier: ____________ Movimientos: ____________ Mellody Drown de inicio: ____________ Mellody Drown de finalizacin: ____________  Toma Copier: ____________ Movimientos: ____________ Mellody Drown de inicio: ____________ Mellody Drown de finalizacin: ____________  Toma Copier: ____________ Movimientos: ____________ Mellody Drown de inicio: ____________ Mellody Drown de finalizacin:  ____________  Toma Copier: ____________ Movimientos: ____________ Mellody Drown de inicio: ____________ Mellody Drown de finalizacin: ____________  Toma Copier: ____________ Movimientos: ____________ Mellody Drown de inicio: ____________ Mellody Drown de finalizacin: ____________  Toma Copier: ____________ Movimientos: ____________ Mellody Drown de inicio: ____________ Mellody Drown de finalizacin: ____________  Toma Copier: ____________ Movimientos: ____________ Mellody Drown de inicio: ____________ Mellody Drown de finalizacin: ____________  Toma Copier: ____________ Movimientos: ____________ Mellody Drown de inicio: ____________ Mellody Drown de finalizacin: ____________  Toma Copier: ____________ Movimientos: ____________ Mellody Drown de inicio: ____________ Mellody Drown de finalizacin: ____________  Toma Copier: ____________ Movimientos: ____________ Mellody Drown de inicio: ____________ Mellody Drown de finalizacin: ____________  Toma Copier: ____________ Movimientos: ____________ Mellody Drown de inicio: ____________ Mellody Drown de finalizacin: ____________  Toma Copier: ____________ Movimientos: ____________ Mellody Drown de inicio: ____________ Mellody Drown de finalizacin: ____________  Toma Copier: ____________ Movimientos: ____________ Mellody Drown de inicio: ____________ Mellody Drown de finalizacin: ____________  Toma Copier: ____________ Movimientos: ____________ Mellody Drown de inicio: ____________ Mellody Drown de finalizacin: ____________  Toma Copier: ____________ Movimientos: ____________ Mellody Drown de inicio: ____________ Mellody Drown de finalizacin: ____________  Toma Copier: ____________ Movimientos: ____________ Mellody Drown de inicio: ____________ Mellody Drown de finalizacin: ____________  Toma Copier: ____________ Movimientos: ____________ Mellody Drown de inicio: ____________ Mellody Drown de finalizacin: ____________  Toma Copier: ____________ Movimientos: ____________ Mellody Drown de inicio: ____________ Mellody Drown de finalizacin: ____________  Toma Copier: ____________ Movimientos: ____________ Mellody Drown de inicio: ____________ Mellody Drown de finalizacin: ____________  Toma Copier: ____________ Movimientos: ____________ Mellody Drown de inicio: ____________ Mellody Drown de finalizacin: ____________  Toma Copier: ____________  Movimientos: ____________ Mellody Drown de inicio: ____________ Mellody Drown de finalizacin: ____________  Toma Copier: ____________ Movimientos: ____________ Mellody Drown de inicio: ____________ Mellody Drown de finalizacin: ____________  Toma Copier: ____________ Movimientos: ____________ Mellody Drown de inicio: ____________ Mellody Drown de finalizacin: ____________    Esta informacin no tiene como fin reemplazar el consejo del mdico. Asegrese de hacerle al mdico cualquier pregunta que tenga.   Document Released: 11/07/2007 Document Revised: 07/17/2012 Elsevier Interactive Patient Education 2016 [place] natural (Natural Childbirth) Un parto natural es un parto en el que no se utilizan medicamentos para el dolor ni anestesia (epidural o espinal). Tampoco se utilizan monitores fetales, no se realiza cesrea (una incisin en la parte inferior del abdomen) ni episiotoma (un corte en la parte inferior de la vagina). Con la ayuda de una persona especializada en partos (partera), usted dirigir su propio parto como desee. Muchas mujeres eligen Chartered certified accountant parto natural porque se sienten que pueden controlar mejor el proceso y Personnel officer en contacto con el parto y el nacimiento. Tambin lo hacen por preocupacin con respecto al [employer] que puedan tener los medicamentos en ellas y el beb. Las  mujeres con Nutritional therapist de alto riesgo no deben Teacher, music un parto natural. Es mejor Advice worker en un hospital por si aparece alguna situacin de Freight forwarder. El mdico interviene por la salud y la seguridad de la madre y el beb. DOS TCNICAS PARA EL PARTO NATURAL:   El mtodo [place] fomenta a las mujeres que tener un beb es normal, saludable y natural. Tambin se ensea a la madre a Best boy una postura neutral con respecto a los medicamentos para Conservation officer, historic buildings y la anestesia y tomar una decisin informada de si es lo correcto para ellas y del momento adecuado.  El mtodo [place] (tambin llamado Parto Asistido por Christella Noa) fomenta al padre a ser el  asistente en el parto y sostiene un enfoque natural. Tambin fomenta el ejercicio y Ardelia Mems dieta balanceada. Los ejercicios ensean relajacin y tcnicas de respiracin profunda. Sin embargo, existen clases para preparar a los padres para una situacin de Freight forwarder. FORMAS DE CONTROLAR EL DOLOR EN EL PARTO: 5. Meditacin. 6. Yoga. 7. Hipnosis. 8. Acupuntura. 9. Masajes. 10. Cambiar posiciones (caminar, mecerse, baarse, recostarse sobre la pelota de dilatacin). 11. Recostarse en agua tibia o en un jacuzzi. 12. Realice algn tipo de actividad que ayude a enfocarse en otra cosa que no sea el parto. 62. Escuchar msica suave. 14. Imgenes visuales (enfocarse en un objeto particular). ANTES DE COMENZAR EL PARTO  Asegrese de que usted y su esposo/compaero estn de acuerdo en llevar un parto natural.  Decida si el profesional que lo asiste o una partera asistir en el parto.  Decida si tendr el beb en un hospital, en una sala de partos o en su hogar.  Si tiene hijos, haga planes para que alguien los cuide cuando usted est en el hospital.  [employer] la distancia y el tiempo que toma llegar a la sala de [place]. Vaya previamente para asegurarse.  Tenga preparado un bolso con un camisn, una bata y artculos de tocador para llevar cuando comience el parto.  Tenga los nmeros telefnicos de sus amigos y familia a mano por si necesita llamar a alguien cuando Arts administrator.  Su esposo/compaero debe concurrir a todas las clases.  Hable con el profesional acerca de la posibilidad de una emergencia mdica y qu pasar si esto ocurre. VENTAJAS DEL PARTO NATURAL  Usted tiene el control del parto.  Es seguro.  No se utilizan medicamentos o anestesias que puedan afectar al beb.  No hay procedimientos invasivos como la episiotoma.  Usted y su compaero trabajarn juntos y eso fortalecer la relacin.  La meditacin, el yoga, los masajes y los ejercicios de respiracin pueden aprenderse  mientras est embarazada y ayudarla cuando est realizando el parto.  En la mayor parte de las salas de parto, la familia y amigos pueden involucrarse en el parto y el proceso de alumbramiento. DESVENTAJAS DEL PARTO NATURAL  Sentir Proofreader.  Los mtodos (descritos anteriormente) para ayudar a Best boy pueden no funcionar en su caso particular.  Puede sentirse avergonzada y frustrada si decide cambiar de idea durante el parto y no tener un parto natural. DESPUS DEL PARTO  Estar muy cansada.  Sentir [employer] debido a las contracciones del tero. Sentir dolor alrededor de Geneticist, molecular.  Es posible que sienta fro y [place], es una reaccin natural.  Se sentir [place], [place], [place] y [place] de ser [place]. INSTRUCCIONES PARA EL CUIDADO DOMICILIARIO  Siga los consejos e indicaciones del profesional que la asiste.  Attalla  indicaciones de Higher education careers adviser de parto natural (mtodo [place] o [place]).   Esta informacin no tiene Marine scientist el consejo del mdico. Asegrese de hacerle al mdico cualquier pregunta que tenga.   Document Released: 10/27/2008 Document Revised: 10/23/2011 Elsevier Interactive Patient Education [employer].

## 2015-08-25 ENCOUNTER — Telehealth (HOSPITAL_COMMUNITY): Payer: Self-pay | Admitting: *Deleted

## 2015-08-25 NOTE — Telephone Encounter (Signed)
[id number] interpreter number  Preadmission screen

## 2015-08-25 NOTE — Telephone Encounter (Signed)
Preadmission screen  

## 2015-08-27 ENCOUNTER — Inpatient Hospital Stay (HOSPITAL_COMMUNITY)
Admission: RE | Admit: 2015-08-27 | Discharge: 2015-08-31 | DRG: 775 | Disposition: A | Discharge status: Home / Self Care | Payer: Medicaid Other | Source: Ambulatory Visit | Attending: Family Medicine | Admitting: Family Medicine

## 2015-08-27 VITALS — BP 119/62 | HR 79 | Temp 98.3°F | Resp 18 | Ht 66.0 in | Wt 230.0 lb

## 2015-08-27 DIAGNOSIS — O3663X0 Maternal care for excessive fetal growth, third trimester, not applicable or unspecified: Secondary | ICD-10-CM | POA: Diagnosis present

## 2015-08-27 DIAGNOSIS — Z3A39 39 weeks gestation of pregnancy: Secondary | ICD-10-CM

## 2015-08-27 DIAGNOSIS — Z833 Family history of diabetes mellitus: Secondary | ICD-10-CM | POA: Diagnosis not present

## 2015-08-27 DIAGNOSIS — O34219 Maternal care for unspecified type scar from previous cesarean delivery: Secondary | ICD-10-CM | POA: Diagnosis present

## 2015-08-27 DIAGNOSIS — O09523 Supervision of elderly multigravida, third trimester: Secondary | ICD-10-CM

## 2015-08-27 DIAGNOSIS — O24425 Gestational diabetes mellitus in childbirth, controlled by oral hypoglycemic drugs: Principal | ICD-10-CM | POA: Diagnosis present

## 2015-08-27 DIAGNOSIS — O24113 Pre-existing diabetes mellitus, type 2, in pregnancy, third trimester: Secondary | ICD-10-CM

## 2015-08-27 DIAGNOSIS — O24419 Gestational diabetes mellitus in pregnancy, unspecified control: Secondary | ICD-10-CM

## 2015-08-27 DIAGNOSIS — Z7984 Long term (current) use of oral hypoglycemic drugs: Secondary | ICD-10-CM

## 2015-08-27 DIAGNOSIS — E118 Type 2 diabetes mellitus with unspecified complications: Secondary | ICD-10-CM

## 2015-08-27 DIAGNOSIS — O0993 Supervision of high risk pregnancy, unspecified, third trimester: Secondary | ICD-10-CM

## 2015-08-27 LAB — GLUCOSE, CAPILLARY
GLUCOSE-CAPILLARY: 102 mg/dL — AB (ref 65–99)
GLUCOSE-CAPILLARY: 77 mg/dL (ref 65–99)
Glucose-Capillary: 55 mg/dL — ABNORMAL LOW (ref 65–99)
Glucose-Capillary: 86 mg/dL (ref 65–99)

## 2015-08-27 LAB — CBC
HCT: 37.8 % (ref 36.0–46.0)
Hemoglobin: 12.5 g/dL (ref 12.0–15.0)
MCH: 24.1 pg — ABNORMAL LOW (ref 26.0–34.0)
MCHC: 33.1 g/dL (ref 30.0–36.0)
MCV: 73 fL — AB (ref 78.0–100.0)
PLATELETS: 235 10*3/uL (ref 150–400)
RBC: 5.18 MIL/uL — ABNORMAL HIGH (ref 3.87–5.11)
RDW: 17 % — AB (ref 11.5–15.5)
WBC: 8.9 10*3/uL (ref 4.0–10.5)

## 2015-08-27 LAB — TYPE AND SCREEN
ABO/RH(D): O POS
ANTIBODY SCREEN: NEGATIVE

## 2015-08-27 LAB — RPR: RPR: NONREACTIVE

## 2015-08-27 MED ORDER — OXYTOCIN 10 UNIT/ML IJ SOLN
2.5000 [IU]/h | INTRAMUSCULAR | Status: DC
  Filled 2015-08-27: qty 4

## 2015-08-27 MED ORDER — TERBUTALINE SULFATE 1 MG/ML IJ SOLN: 0.2500 mg | Freq: Once | INTRAMUSCULAR | Status: DC | PRN

## 2015-08-27 MED ORDER — OXYCODONE-ACETAMINOPHEN 5-325 MG PO TABS
1.0000 | ORAL_TABLET | ORAL | Status: DC | PRN
Start: 2015-08-27 — End: 2015-08-29

## 2015-08-27 MED ORDER — CITRIC ACID-SODIUM CITRATE 334-500 MG/5ML PO SOLN: 30.0000 mL | ORAL | Status: DC | PRN

## 2015-08-27 MED ORDER — GLYBURIDE 5 MG PO TABS: 10.0000 mg | ORAL_TABLET | Freq: Every day | ORAL | Status: DC

## 2015-08-27 MED ORDER — FENTANYL CITRATE (PF) 100 MCG/2ML IJ SOLN
100.0000 ug | INTRAMUSCULAR | Status: DC | PRN
  Administered 2015-08-27 – 2015-08-28 (×4): 100 ug via INTRAVENOUS
  Filled 2015-08-27 (×4): qty 2

## 2015-08-27 MED ORDER — OXYTOCIN BOLUS FROM INFUSION: 500.0000 mL | INTRAVENOUS | Status: DC

## 2015-08-27 MED ORDER — ONDANSETRON HCL 4 MG/2ML IJ SOLN: 4.0000 mg | Freq: Four times a day (QID) | INTRAMUSCULAR | Status: DC | PRN

## 2015-08-27 MED ORDER — GLYBURIDE 5 MG PO TABS: 5.0000 mg | ORAL_TABLET | Freq: Every day | ORAL | Status: DC

## 2015-08-27 MED ORDER — LACTATED RINGERS IV SOLN
INTRAVENOUS | Status: DC
  Administered 2015-08-27 – 2015-08-29 (×4): via INTRAVENOUS

## 2015-08-27 MED ORDER — OXYCODONE-ACETAMINOPHEN 5-325 MG PO TABS: 2.0000 | ORAL_TABLET | ORAL | Status: DC | PRN

## 2015-08-27 MED ORDER — FLEET ENEMA 7-19 GM/118ML RE ENEM: 1.0000 | ENEMA | RECTAL | Status: DC | PRN

## 2015-08-27 MED ORDER — LIDOCAINE HCL (PF) 1 % IJ SOLN
30.0000 mL | INTRAMUSCULAR | Status: DC | PRN
  Filled 2015-08-27: qty 30

## 2015-08-27 MED ORDER — LACTATED RINGERS IV SOLN
500.0000 mL | INTRAVENOUS | Status: DC | PRN
Start: 2015-08-27 — End: 2015-08-31
  Administered 2015-08-29: 500 mL via INTRAVENOUS

## 2015-08-27 MED ORDER — ACETAMINOPHEN 325 MG PO TABS
650.0000 mg | ORAL_TABLET | ORAL | Status: DC | PRN
  Administered 2015-08-27 – 2015-08-29 (×2): 650 mg via ORAL
  Filled 2015-08-27 (×4): qty 2

## 2015-08-27 MED ORDER — METFORMIN HCL 500 MG PO TABS: 500.0000 mg | ORAL_TABLET | Freq: Two times a day (BID) | ORAL | Status: DC

## 2015-08-27 MED ORDER — OXYTOCIN 10 UNIT/ML IJ SOLN
1.0000 m[IU]/min | INTRAVENOUS | Status: DC
  Administered 2015-08-27: 2 m[IU]/min via INTRAVENOUS
  Administered 2015-08-28: 10 m[IU]/min via INTRAVENOUS
  Administered 2015-08-29: 666 m[IU]/min via INTRAVENOUS
  Filled 2015-08-27: qty 4

## 2015-08-27 NOTE — H&P (Signed)
LABOR ADMISSION HISTORY AND PHYSICAL  Yanis Brus is a 40 y.o. female [phone] with IUP at [redacted]w[redacted]d by [redacted]w[redacted]d [place] presenting for IOL due to [facility]. 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 She reports +FM, no contractions, No LOF, no VB, no blurry vision, headaches or peripheral edema, and RUQ pain.  She plans on breast feeding. She is undecided on method of contraception.  Dating: By [redacted]w[redacted]d --->  Estimated Date of Delivery: 09/03/15  Sono:    @[redacted]w[redacted]d , normal anatomy, 297g, 49% EFW @ [redacted]w[redacted]d, 3502g, >90% EFW   Prenatal History/Complications: Class B DM2 Hx of HSV 2 on valtrex AMA LGA fetus   Past Medical History: Past Medical History  Diagnosis Date  . History of chlamydia 2008  . AMA (advanced maternal age) multigravida 62+   . Obese   . History of candidal vulvovaginitis   . Gestational diabetes     glyburide  . Herpes     Past Surgical History: Past Surgical History  Procedure Laterality Date  . Cesarean section  05/21/2011    Procedure: CESAREAN SECTION;  Surgeon: Jonnie Kind, MD;  Location: [facility];  Service: Gynecology;  Laterality: N/A;  Primary cesarean section with delivery of baby girl at 40. Apgars 9/9.    Obstetrical History: OB History    Gravida Para Term Preterm AB TAB SAB Ectopic Multiple Living   4 2 2  1 1    2       Obstetric Comments   FBS 85-103 and pp up to 154  Subjective: Arjanae Delmedico is a 40 y.o. 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  She is currently monitored for the following issues for this high-risk pregnanc y and has Language barrier; AMA (advanced maternal age) multigravida 38+; H/O herpes simplex type 2 infection; Previous cesarean section; LGA (large for gestational age) fetus; Obesity; Anemia; Gestational diabetes mellitus, antepartum; and Supervision o f high-risk pregnancy on her problem list.  Patient reports no complaints.  @FLOW [id number]. @FLOW [id number].  @FLOW [id number].  Denies leaking of fluid.   The following portions of the patient's history were reviewed and updated as appropriate: allergies , current medications, past family history, past medical history, past social history, past surgical history and problem list. Problem list updated.  Objective: @VITALSMULTIPLE @  Fetal Status: @FLOW ([id number])@ @FLOW [id number] @FLOW [id number]  @FLOW [id number]   General:       Social History: Social History   Social History  . Marital Status: Married    Spouse Name: N/A  . Number of Children: N/A  . Years of Education: N/A   Social History Main Topics  . Smoking status: Never Smoker   . Smokeless tobacco: Never Used  . Alcohol Use: No  . Drug Use: No  . Sexual Activity: Yes    Birth Control/ Protection: None   Other Topics Concern  . Not on file   Social History Narrative    Family History: Family History  Problem Relation Age of Onset  . Diabetes Mother     oral agents  . Anesthesia problems Neg Hx   . Hypotension Neg Hx   . Malignant hyperthermia Neg Hx   . Pseudochol deficiency Neg Hx     Allergies: No Known Allergies  Prescriptions prior to admission  Medication Sig Dispense Refill Last Dose  . aspirin EC 81 MG tablet Take 1 tablet (81 mg total) by mouth daily. 100 tablet 3 08/26/2015 at Unknown time  . ferrous sulfate 325 (65 FE) MG tablet  Take 325 mg by mouth daily with breakfast.   08/26/2015 at Unknown time  . glyBURIDE (DIABETA) 5 MG tablet Take 2 tablet at breakfast and 1 tablet at bedtime (Patient taking differently: Take 5 mg by mouth 2 (two) times daily. ) 90 tablet 2 08/26/2015 at Unknown time  . metFORMIN (GLUCOPHAGE) 500 MG tablet Take 1 tablet (500 mg total) by mouth 2 (two) times daily with a meal. 60 tablet 2 08/26/2015 at Unknown time  . Prenatal Vit-Fe Fumarate-FA (PRENATAL MULTIVITAMIN) TABS tablet Take 1 tablet by mouth daily at 12 noon. 30 tablet 1 08/26/2015 at Unknown time  . valACYclovir (VALTREX) 1000 MG tablet Take 1  tablet (1,000 mg total) by mouth daily. 30 tablet 1 08/26/2015 at Unknown time  . ACCU-CHEK FASTCLIX LANCETS MISC 1 Units by Percutaneous route 4 (four) times daily. 100 each 12 Taking  . acetaminophen (TYLENOL) 500 MG tablet Take 1,000 mg by mouth every 6 (six) hours as needed for mild pain.    prn  . glucose blood (ACCU-CHEK SMARTVIEW) test strip Use as instructed to check blood sugars 100 each 12 Taking     Review of Systems   All systems reviewed and negative except as stated in HPI  BP 116/73 mmHg  Pulse 83  LMP 12/09/2014 (Approximate) General appearance: alert and cooperative Lungs: clear to auscultation bilaterally Heart: regular rate and rhythm Abdomen: soft, non-tender; bowel sounds normal Extremities: Homans sign is negative, no sign of DVT, edema Fetal monitoringBaseline: 135 bpm, Variability: Good {> 6 bpm), Accelerations: Reactive and Decelerations: Absent Uterine activity: none seen on tracing  Dilation: Fingertip Effacement (%): 40   Prenatal labs: ABO, Rh: O/Positive/-- (08/19 0000) Antibody: Negative (08/19 0000) Rubella: Immune RPR: NON REAC (10/24 1133)  HBsAg: Negative (08/19 0000)  HIV: NONREACTIVE (10/24 1133)  GBS: Negative (12/19 0000)  1 hr Glucola: early 18; 114/231/186/113 Genetic screening: QUAD negative  Anatomy US wnl  Prenatal Transfer Tool  Maternal Diabetes: Yes:  Diabetes Type:  Pre-pregnancy Genetic Screening: Normal Maternal Ultrasounds/Referrals: Abnormal:  Findings:   Other:LGA Fetal Ultrasounds or other Referrals:  Fetal echo Maternal Substance Abuse:  No Significant Maternal Medications:  Meds include: Other: Metformin, Glyburide, Valtrex Significant Maternal Lab Results: Lab values include: Group B Strep negative  Results for orders placed or performed during the hospital encounter of 08/27/15 (from the past 24 hour(s))  CBC   Collection Time: 08/27/15  8:45 AM  Result Value Ref Range   WBC 8.9 4.0 - 10.5 K/uL   RBC 5.18 (H)  3.87 - 5.11 MIL/uL   Hemoglobin 12.5 12.0 - 15.0 g/dL   HCT 37.8 36.0 - 46.0 %   MCV 73.0 (L) 78.0 - 100.0 fL   MCH 24.1 (L) 26.0 - 34.0 pg   MCHC 33.1 30.0 - 36.0 g/dL   RDW 17.0 (H) 11.5 - 15.5 %   Platelets 235 150 - 400 K/uL    Patient Active Problem List   Diagnosis Date Noted  . Normal labor and delivery 08/27/2015  . Supervision of high-risk pregnancy 06/07/2015  . Language barrier 04/26/2015  . AMA (advanced maternal age) multigravida 35+ 04/26/2015  . H/O herpes simplex type 2 infection 04/26/2015  . Previous cesarean section 04/26/2015  . LGA (large for gestational age) fetus 04/26/2015  . Obesity 04/26/2015  . Anemia 04/26/2015  . Gestational diabetes mellitus, antepartum 04/26/2015    Assessment: Solomia Keppler is a 40 y.o. [id number] at [redacted]w[redacted]d here for IOL 2/2 Class B DM2. With LGA fetus on  most recent US.   #Labor: IOL with Pitocin  #Pain: IV pain med upon request  #FWB: Cat 1 #ID: GBS negative  #MOF: breast  #MOC: undecided  #Circ: declined  Smiley Houseman, MD PGY 1 Family Medicine   Seen and examined by me also Dilation: Fingertip Effacement (%): 40 Exam by:: Hansel Feinstein Vertex confirmed by bedside US per myself and Dr Nehemiah Settle WIll use Pitocin to bring baby down and dilate cervix CBG q 4 hrs  Seabron Spates, CNM

## 2015-08-27 NOTE — Progress Notes (Signed)
Patient ID: Morgan Ramsey, female   DOB: 06-26-1976, 40 y.o.   MRN: [id number] Doing well, not hurting much  Filed Vitals:   08/27/15 1417 08/27/15 1537 08/27/15 1543 08/27/15 1700  BP: 116/77 117/69  116/65  Pulse: 85 82  78  Temp:   98.1 F (36.7 C)   TempSrc:   Oral   Height:      Weight:       FHR reactive UCs every 2-4 min  Foley inserted and inflated to 60cc  Will allow to eat then resume Pitocin

## 2015-08-27 NOTE — Progress Notes (Signed)
This note also relates to the following rows which could not be included: Rate (mL/hr) Oxytocin - Cannot attach notes to extension rows Concentration Oxytocin - Cannot attach notes to extension rows   Pitocin off, telephone order from K. Brigitte Pulse, CNM.  Pitocin to remain off while foley bulb remains in place.

## 2015-08-27 NOTE — Progress Notes (Signed)
Patient ID: Morgan Ramsey, female   DOB: 11-11-1975, 40 y.o.   MRN: [id number] Doing well Filed Vitals:   08/27/15 0954 08/27/15 1002 08/27/15 1008 08/27/15 1135  BP:  118/70 117/71 108/71  Pulse:  81 82 72  Height: 5\' 6"  (1.676 m)     Weight: 104.327 kg (230 lb)      FHR reactive UCs every 2-3 min   Continue to observe

## 2015-08-28 ENCOUNTER — Encounter (HOSPITAL_COMMUNITY): Payer: Self-pay

## 2015-08-28 LAB — GLUCOSE, CAPILLARY
GLUCOSE-CAPILLARY: 108 mg/dL — AB (ref 65–99)
Glucose-Capillary: 81 mg/dL (ref 65–99)
Glucose-Capillary: 83 mg/dL (ref 65–99)
Glucose-Capillary: 68 mg/dL (ref 65–99)
Glucose-Capillary: 105 mg/dL — ABNORMAL HIGH (ref 65–99)

## 2015-08-28 NOTE — Progress Notes (Signed)
1200 CBG at 1200 = 75.

## 2015-08-28 NOTE — Progress Notes (Signed)
Spanish interpreter at bs to see if pt had any questions. Pt states she may want pain relief at a later time and she is considering iv pain medicine. Will let RN know when she is ready.

## 2015-08-28 NOTE — Progress Notes (Signed)
Patient ID: Morgan Ramsey, female   DOB: 28-Feb-1976, 40 y.o.   MRN: [id number] Consulted Dr Roselie Awkward.re:  Amniotomy  FHR reassuring  Dilation: 5 Effacement (%): 70 Cervical Position: Posterior Station: -3 Presentation: Vertex Exam by:: [place] CNM  Amniotomy performed No decelerations  Light to med meconium stained fluid  Anticipated increased labor now

## 2015-08-28 NOTE — Progress Notes (Signed)
Morgan Ramsey is a 40 y.o. [id number] at [redacted]w[redacted]d by ultrasound admitted for induction of labor due to [facility]; [place].  Subjective: In active labor on pitocin doing well. Not wanting to use pain medicine for delivery  Objective: BP 120/62 mmHg  Pulse 71  Temp(Src) 98.3 F (36.8 C) (Oral)  Resp 18  Ht 5\' 6"  (1.676 m)  Wt 104.327 kg (230 lb)  BMI 37.14 kg/m2  LMP 12/09/2014 (Approximate)      FHT:  FHR: 135 bpm, variability: moderate,  accelerations:  Present,  decelerations:  Absent UC:   regular, every 2 minutes SVE:   Dilation: 6.5 Effacement (%): 80 Station: -3 Exam by:: [email or web address]  Labs: Lab Results  Component Value Date   WBC 8.9 08/27/2015   HGB 12.5 08/27/2015   HCT 37.8 08/27/2015   MCV 73.0* 08/27/2015   PLT 235 08/27/2015    Assessment / Plan: Induction of labor due to ama,  progressing well on pitocin  Labor: Progressing normally Preeclampsia:   Fetal Wellbeing:  Category I Pain Control:  Labor support without medications I/D:  n/a Anticipated MOD:  NSVD  Clemmons,Lori Grissett 08/28/2015, 5:16 PM

## 2015-08-28 NOTE — Progress Notes (Signed)
Interpreter at [facility] to explain POC to decrease and titrate pitocin. Any questions answered. Pt requesting po liquids. Order sent to dietary. Not requesting pain meds at this time.

## 2015-08-29 ENCOUNTER — Inpatient Hospital Stay (HOSPITAL_COMMUNITY): Payer: Medicaid Other | Admitting: Anesthesiology

## 2015-08-29 ENCOUNTER — Encounter (HOSPITAL_COMMUNITY): Payer: Self-pay

## 2015-08-29 DIAGNOSIS — E119 Type 2 diabetes mellitus without complications: Secondary | ICD-10-CM

## 2015-08-29 DIAGNOSIS — O3663X0 Maternal care for excessive fetal growth, third trimester, not applicable or unspecified: Secondary | ICD-10-CM

## 2015-08-29 DIAGNOSIS — Z3A39 39 weeks gestation of pregnancy: Secondary | ICD-10-CM

## 2015-08-29 DIAGNOSIS — O09523 Supervision of elderly multigravida, third trimester: Secondary | ICD-10-CM

## 2015-08-29 DIAGNOSIS — O2412 Pre-existing diabetes mellitus, type 2, in childbirth: Secondary | ICD-10-CM

## 2015-08-29 LAB — GLUCOSE, CAPILLARY
GLUCOSE-CAPILLARY: 75 mg/dL (ref 65–99)
Glucose-Capillary: 93 mg/dL (ref 65–99)
Glucose-Capillary: 104 mg/dL — ABNORMAL HIGH (ref 65–99)
Glucose-Capillary: 86 mg/dL (ref 65–99)
Glucose-Capillary: 123 mg/dL — ABNORMAL HIGH (ref 65–99)

## 2015-08-29 MED ORDER — SENNOSIDES-DOCUSATE SODIUM 8.6-50 MG PO TABS
2.0000 | ORAL_TABLET | ORAL | Status: DC
  Administered 2015-08-30 (×2): 2 via ORAL
  Filled 2015-08-29 (×2): qty 2

## 2015-08-29 MED ORDER — EPHEDRINE 5 MG/ML INJ
10.0000 mg | INTRAVENOUS | Status: DC | PRN
Start: 2015-08-29 — End: 2015-08-29

## 2015-08-29 MED ORDER — ONDANSETRON HCL 4 MG PO TABS: 4.0000 mg | ORAL_TABLET | ORAL | Status: DC | PRN

## 2015-08-29 MED ORDER — DIPHENHYDRAMINE HCL 25 MG PO CAPS: 25.0000 mg | ORAL_CAPSULE | Freq: Four times a day (QID) | ORAL | Status: DC | PRN

## 2015-08-29 MED ORDER — IBUPROFEN 600 MG PO TABS
600.0000 mg | ORAL_TABLET | Freq: Four times a day (QID) | ORAL | Status: DC
  Administered 2015-08-29 – 2015-08-31 (×8): 600 mg via ORAL
  Filled 2015-08-29 (×8): qty 1

## 2015-08-29 MED ORDER — DIBUCAINE 1 % RE OINT: 1.0000 "application " | TOPICAL_OINTMENT | RECTAL | Status: DC | PRN

## 2015-08-29 MED ORDER — ONDANSETRON HCL 4 MG/2ML IJ SOLN: 4.0000 mg | INTRAMUSCULAR | Status: DC | PRN

## 2015-08-29 MED ORDER — SODIUM CHLORIDE 0.9 % IV SOLN
2.0000 g | Freq: Once | INTRAVENOUS | Status: AC
  Administered 2015-08-29: 2 g via INTRAVENOUS
  Filled 2015-08-29: qty 2000

## 2015-08-29 MED ORDER — BENZOCAINE-MENTHOL 20-0.5 % EX AERO: 1.0000 "application " | INHALATION_SPRAY | CUTANEOUS | Status: DC | PRN

## 2015-08-29 MED ORDER — TETANUS-DIPHTH-ACELL PERTUSSIS 5-2.5-18.5 LF-MCG/0.5 IM SUSP
0.5000 mL | Freq: Once | INTRAMUSCULAR | Status: AC
  Administered 2015-08-30: 0.5 mL via INTRAMUSCULAR
  Filled 2015-08-29: qty 0.5

## 2015-08-29 MED ORDER — MEASLES, MUMPS & RUBELLA VAC ~~LOC~~ INJ
0.5000 mL | INJECTION | Freq: Once | SUBCUTANEOUS | Status: DC
  Filled 2015-08-29: qty 0.5

## 2015-08-29 MED ORDER — LIDOCAINE HCL (PF) 1 % IJ SOLN
INTRAMUSCULAR | Status: DC | PRN
  Administered 2015-08-29: 5 mL via EPIDURAL
  Administered 2015-08-29: 2 mL via EPIDURAL
  Administered 2015-08-29: 3 mL via EPIDURAL

## 2015-08-29 MED ORDER — ZOLPIDEM TARTRATE 5 MG PO TABS: 5.0000 mg | ORAL_TABLET | Freq: Every evening | ORAL | Status: DC | PRN

## 2015-08-29 MED ORDER — GENTAMICIN SULFATE 40 MG/ML IJ SOLN
160.0000 mg | Freq: Three times a day (TID) | INTRAVENOUS | Status: DC
  Administered 2015-08-29: 160 mg via INTRAVENOUS
  Filled 2015-08-29 (×3): qty 4

## 2015-08-29 MED ORDER — SODIUM CHLORIDE 0.9 % IV SOLN: 1.0000 g | Freq: Four times a day (QID) | INTRAVENOUS | Status: DC

## 2015-08-29 MED ORDER — PRENATAL MULTIVITAMIN CH
1.0000 | ORAL_TABLET | Freq: Every day | ORAL | Status: DC
  Administered 2015-08-30 – 2015-08-31 (×2): 1 via ORAL
  Filled 2015-08-29 (×2): qty 1

## 2015-08-29 MED ORDER — SODIUM CHLORIDE 0.9 % IJ SOLN: 3.0000 mL | Freq: Two times a day (BID) | INTRAMUSCULAR | Status: DC

## 2015-08-29 MED ORDER — ACETAMINOPHEN 325 MG PO TABS
650.0000 mg | ORAL_TABLET | ORAL | Status: DC | PRN
  Administered 2015-08-29: 650 mg via ORAL

## 2015-08-29 MED ORDER — SODIUM CHLORIDE 0.9 % IV SOLN: 250.0000 mL | INTRAVENOUS | Status: DC | PRN

## 2015-08-29 MED ORDER — WITCH HAZEL-GLYCERIN EX PADS: 1.0000 "application " | MEDICATED_PAD | CUTANEOUS | Status: DC | PRN

## 2015-08-29 MED ORDER — PHENYLEPHRINE 40 MCG/ML (10ML) SYRINGE FOR IV PUSH (FOR BLOOD PRESSURE SUPPORT)
80.0000 ug | PREFILLED_SYRINGE | INTRAVENOUS | Status: DC | PRN
Start: 2015-08-29 — End: 2015-08-29
  Filled 2015-08-29: qty 20

## 2015-08-29 MED ORDER — SODIUM CHLORIDE 0.9 % IJ SOLN: 3.0000 mL | INTRAMUSCULAR | Status: DC | PRN

## 2015-08-29 MED ORDER — LANOLIN HYDROUS EX OINT: TOPICAL_OINTMENT | CUTANEOUS | Status: DC | PRN

## 2015-08-29 MED ORDER — SIMETHICONE 80 MG PO CHEW
80.0000 mg | CHEWABLE_TABLET | ORAL | Status: DC | PRN
  Administered 2015-08-30: 80 mg via ORAL
  Filled 2015-08-29: qty 1

## 2015-08-29 MED ORDER — FENTANYL 2.5 MCG/ML BUPIVACAINE 1/10 % EPIDURAL INFUSION (WH - ANES)
14.0000 mL/h | INTRAMUSCULAR | Status: DC | PRN
  Administered 2015-08-29 (×3): 14 mL/h via EPIDURAL
  Filled 2015-08-29 (×2): qty 125

## 2015-08-29 MED ORDER — DIPHENHYDRAMINE HCL 50 MG/ML IJ SOLN: 12.5000 mg | INTRAMUSCULAR | Status: DC | PRN

## 2015-08-29 MED ORDER — SODIUM CHLORIDE 0.9 % IV SOLN
1.0000 g | INTRAVENOUS | Status: DC
  Filled 2015-08-29 (×4): qty 1000

## 2015-08-29 NOTE — Anesthesia Procedure Notes (Signed)
Epidural Patient location during procedure: OB  Staffing Anesthesiologist: Catalina Gravel Performed by: anesthesiologist   Preanesthetic Checklist Completed: patient identified, pre-op evaluation, timeout performed, IV checked, risks and benefits discussed and monitors and equipment checked  Epidural Patient position: sitting Prep: DuraPrep Patient monitoring: blood pressure and continuous pulse ox Approach: midline Location: L3-L4 Injection technique: LOR air  Needle:  Needle type: Tuohy  Needle gauge: 17 G Needle length: 9 cm Needle insertion depth: 6 cm Catheter size: 19 Gauge Catheter at skin depth: 11 cm Test dose: negative and Other (1% Lidocaine)  Additional Notes Patient identified.  Risk benefits discussed including failed block, incomplete pain control, headache, nerve damage, paralysis, blood pressure changes, nausea, vomiting, reactions to medication both toxic or allergic, and postpartum back pain.  Patient expressed understanding and wished to proceed.  All questions were answered.  Sterile technique used throughout procedure and epidural site dressed with sterile barrier dressing. No paresthesia or other complications noted. The patient did not experience any signs of intravascular injection such as tinnitus or metallic taste in mouth nor signs of intrathecal spread such as rapid motor block. Please see nursing notes for vital signs. Reason for block:procedure for pain

## 2015-08-29 NOTE — Progress Notes (Signed)
Patient ID: Morgan Ramsey, female   DOB: 12-Sep-1975, 40 y.o.   MRN: [id number] Wants epidural for pain relief  Filed Vitals:   08/28/15 2022 08/28/15 2130 08/28/15 2320 08/29/15 0020  BP: 122/66 141/69 110/51 113/62  Pulse: 71 69 71 70  Temp:  98.3 F (36.8 C) 98.3 F (36.8 C)   TempSrc:  Oral Oral   Resp:  18    Height:      Weight:       FHR reassuring UCs irregular  Dilation:  (attempted VE, unable to reach cervix) Effacement (%): 70 Cervical Position: Posterior Station: -3 Presentation: Vertex Exam by:: Carmelia Roller, cnm  Will insert IUPC after patient comfortable

## 2015-08-29 NOTE — Anesthesia Preprocedure Evaluation (Signed)
Anesthesia Evaluation  Patient identified by MRN, date of birth, ID band Patient awake    Reviewed: Allergy & Precautions, NPO status , Patient's Chart, lab work & pertinent test results  Airway Mallampati: II  TM Distance: >3 FB Neck ROM: Full    Dental  (+) Teeth Intact, Dental Advisory Given   Pulmonary neg pulmonary ROS,    Pulmonary exam normal breath sounds clear to auscultation       Cardiovascular negative cardio ROS Normal cardiovascular exam Rhythm:Regular Rate:Normal     Neuro/Psych negative neurological ROS     GI/Hepatic negative GI ROS, Neg liver ROS,   Endo/Other  diabetes (Gestational), Oral Hypoglycemic AgentsObesity   Renal/GU negative Renal ROS     Musculoskeletal negative musculoskeletal ROS (+)   Abdominal   Peds  Hematology negative hematology ROS (+) plt 235k    Anesthesia Other Findings Day of surgery medications reviewed with the patient.  Reproductive/Obstetrics (+) Pregnancy                             Anesthesia Physical Anesthesia Plan  ASA: III  Anesthesia Plan: Epidural   Post-op Pain Management:    Induction:   Airway Management Planned:   Additional Equipment:   Intra-op Plan:   Post-operative Plan:   Informed Consent: I have reviewed the patients History and Physical, chart, labs and discussed the procedure including the risks, benefits and alternatives for the proposed anesthesia with the patient or authorized representative who has indicated his/her understanding and acceptance.   Dental advisory given  Plan Discussed with:   Anesthesia Plan Comments: (Patient identified. Risks/Benefits/Options discussed with patient including but not limited to bleeding, infection, nerve damage, paralysis, failed block, incomplete pain control, headache, blood pressure changes, nausea, vomiting, reactions to medication both or allergic, itching and  postpartum back pain. Confirmed with bedside nurse the patient's most recent platelet count. Confirmed with patient that they are not currently taking any anticoagulation, have any bleeding history or any family history of bleeding disorders. Patient expressed understanding and wished to proceed. All questions were answered. )        Anesthesia Quick Evaluation

## 2015-08-29 NOTE — Progress Notes (Signed)
Morgan Ramsey is a 40 y.o. [id number] at [redacted]w[redacted]d by ultrasound admitted for induction of labor due to Gestational diabetes.  Subjective:   Objective: BP 130/73 mmHg  Pulse 81  Temp(Src) 98.3 F (36.8 C) (Axillary)  Resp 18  Ht 5\' 6"  (1.676 m)  Wt 230 lb (104.327 kg)  BMI 37.14 kg/m2  SpO2 100%  LMP 12/09/2014 (Approximate) I/O last 3 completed shifts: In: 850 [Other:850] Out: -  Total I/O In: -  Out: 150 [Urine:150]  FHT:  FHR: 150's bpm, variability: moderate,  accelerations:  Present,  decelerations:  Present variable UC:   regular, every 3 minutes SVE:   Dilation: 10 Effacement (%): 100 Station: -2 Exam by:: dlawson,cnm  Labs: Lab Results  Component Value Date   WBC 8.9 08/27/2015   HGB 12.5 08/27/2015   HCT 37.8 08/27/2015   MCV 73.0* 08/27/2015   PLT 235 08/27/2015    Assessment / Plan: Induction of labor due to gestational diabetes,  progressing well on pitocin  Labor: progressing Preeclampsia:  no signs or symptoms of toxicity and intake and ouput balanced Fetal Wellbeing:  Category II Pain Control:  Epidural I/D:  n/a Anticipated MOD:  NSVD  Morgan Ramsey 08/29/2015, 9:42 AM

## 2015-08-29 NOTE — Progress Notes (Signed)
0835 CBG = 83.

## 2015-08-29 NOTE — Progress Notes (Signed)
ANTIBIOTIC CONSULT NOTE - INITIAL  Pharmacy Consult for Gentamicin Indication: Chorioamnionitis  No Known Allergies  Patient Measurements: Height: 5\' 6"  (167.6 cm) Weight: 230 lb (104.327 kg) IBW/kg (Calculated) : 59.3kg Adjusted Body Weight:73 kg  Vital Signs: Temp: 100.6 F (38.1 C) (01/15 1029) Temp Source: Oral (01/15 1029) BP: 131/74 mmHg (01/15 1031) Pulse Rate: 84 (01/15 1035)  Labs:  Recent Labs  08/27/15 0845  WBC 8.9  HGB 12.5  PLT 235     Microbiology: Recent Results (from the past 720 hour(s))  OB RESULT CONSOLE Group B Strep     Status: None   Collection Time: 08/02/15 12:00 AM  Result Value Ref Range Status   GBS Negative  Final  Culture, beta strep (group b only)     Status: None   Collection Time: 08/02/15 11:26 AM  Result Value Ref Range Status   Organism ID, Bacteria NO GROUP B STREP (S.AGALACTIAE) ISOLATED  Final   Last SCr on 04/29/15 = 0.51; will use an estimated SCr of 0.7 with estimated CrCl of 100 ml/min  Medications:  Ampicillin 2 grams IV x 1 then 1 gram IV Q 4 hr  Assessment: 40 y.o. female [id number] at [redacted]w[redacted]d with fever Estimated Ke = 0.3, Vd = 0.33 L/kg  Goal of Therapy:  Gentamicin peak 6-8 mg/L and Trough < 1 mg/L  Plan:  Gentamicin 160 mg IV every 8 hrs  Check Scr with next labs if gentamicin continued. Will check gentamicin levels if continued > 72hr or clinically indicated.  Beryle Lathe 08/29/2015,11:02 AM

## 2015-08-29 NOTE — Progress Notes (Signed)
Morgan Ramsey is a 40 y.o. [id number] at [redacted]w[redacted]d by ultrasound admitted for induction of labor due to Gestational diabetes.  Subjective:   Objective: BP 88/52 mmHg  Pulse 80  Temp(Src) 98.4 F (36.9 C) (Axillary)  Resp 16  Ht 5\' 6"  (1.676 m)  Wt 104.327 kg (230 lb)  BMI 37.14 kg/m2  SpO2 98%  LMP 12/09/2014 (Approximate)      FHT:   Fetal Heart Rate A      Mode  External filed at 08/29/2015 0220    Baseline Rate (A)  135 bpm filed at 08/29/2015 0530    Variability  6-25 BPM filed at 08/29/2015 0530    Accelerations  15 x 15 filed at 08/29/2015 0530    Decelerations  None filed at 08/29/2015 0530       UC:   irregular, every 3-4 minutes SVE:   80/6-7/-3/v IUPC placed Labs: Lab Results  Component Value Date   WBC 8.9 08/27/2015   HGB 12.5 08/27/2015   HCT 37.8 08/27/2015   MCV 73.0* 08/27/2015   PLT 235 08/27/2015    Assessment / Plan: Induction of labor due to gestational diabetes,  progressing well on pitocin  Labor: Progressing normally Preeclampsia:  no signs or symptoms of toxicity Fetal Wellbeing:  Category I Pain Control:  Epidural I/D:  n/a Anticipated MOD:  NSVD  Morgan Ramsey 08/29/2015, 6:06 AM

## 2015-08-30 NOTE — Progress Notes (Signed)
I assisted Morgan Ramsey with explanation of Jaundice. [place] Interpreter.

## 2015-08-30 NOTE — Lactation Note (Signed)
This note was copied from the chart of [place]. Lactation Consultation Note: called by RN to assist with feeding. Baby under phototherapy and is sleepy at the breast. RN attempted to feed with cup but baby too sleepy. Mom has baby at the breast when I went into room. Tried with feeding tube/syringe and baby took 12 cc's Alumentum , then off to sleep. To call for assist prn  Patient Name: Morgan Ramsey [id number] Date: 08/30/2015 Reason for consult: Follow-up assessment   Maternal Data Formula Feeding for Exclusion: No  Feeding Feeding Type: Breast Fed Length of feed: 10 min  LATCH Score/Interventions Latch: Repeated attempts needed to sustain latch, nipple held in mouth throughout feeding, stimulation needed to elicit sucking reflex.  Audible Swallowing: A few with stimulation  Type of Nipple: Everted at rest and after stimulation  Comfort (Breast/Nipple): Soft / non-tender     Hold (Positioning): Assistance needed to correctly position infant at breast and maintain latch. Intervention(s): Breastfeeding basics reviewed  LATCH Score: 7  Lactation Tools Discussed/Used     Consult Status Consult Status: Follow-up Date: 08/31/15 Follow-up type: In-patient    Truddie Crumble 08/30/2015, 10:51 AM

## 2015-08-30 NOTE — Progress Notes (Signed)
UR chart review completed.  

## 2015-08-30 NOTE — Progress Notes (Signed)
I ordered patient's lunch.  Morgan Ramsey  Interpreter. °

## 2015-08-30 NOTE — Lactation Note (Addendum)
This note was copied from the chart of Morgan Ramsey. Lactation Consultation Note Experienced BF mom of 14 months for her other 2 daughters. The oldest she was exclusively breast d/t she didn't like the bottle. Her 2nd daughter she breast and bottle fed her. She plans on breast and bottle for this baby as well. Spanish interpreter present for visit. Mom has wide space between breast, w/tubular shaped breast. Mom was in recliner trying to BF baby. Mom holding baby in cradle position, popping off and on breast , nose stuffy. Asked mom if she had tried football hold, stated no. Demonstrated football hold so baby might could breath better. Baby fussy. Attempted to burp baby. After assisting in position baby burped. Had some reflux, mom done STS on her chest and baby settled down. With interpreter, Mom encouraged to feed baby 8-12 times/24 hours and with feeding cues. Hand expression taught to Mom. Referred to Baby and Me Book in Breastfeeding section Pg. 22-23 for position options and Proper latch demonstration.Mom encouraged to do skin-to-skin. [place] brochure given w/resources, support groups and [place] services. Mom has [place]. Patient Name: Morgan Ramsey Date: 08/30/2015 Reason for consult: Initial assessment   Maternal Data Has patient been taught Hand Expression?: Yes Does the patient have breastfeeding experience prior to this delivery?: Yes  Feeding Feeding Type: Breast Fed Length of feed: 5 min  LATCH Score/Interventions Latch: Repeated attempts needed to sustain latch, nipple held in mouth throughout feeding, stimulation needed to elicit sucking reflex. Intervention(s): Adjust position;Assist with latch;Breast massage;Breast compression  Audible Swallowing: A few with stimulation Intervention(s): Skin to skin;Hand expression;Alternate breast massage  Type of Nipple: Everted at rest and after stimulation  Comfort (Breast/Nipple): Soft / non-tender     Hold  (Positioning): Assistance needed to correctly position infant at breast and maintain latch. Intervention(s): Breastfeeding basics reviewed;Support Pillows;Position options;Skin to skin  LATCH Score: 7  Lactation Tools Discussed/Used WIC Program: Yes   Consult Status Consult Status: Follow-up Date: 08/30/15 (in pm) Follow-up type: In-patient    Lezli Danek, Elta [place] 08/30/2015, 2:22 AM

## 2015-08-30 NOTE — Progress Notes (Signed)
Post Partum Day 1  Subjective:  Morgan Ramsey is a 40 y.o. [id number] [redacted]w[redacted]d s/p SVD.  No acute events overnight.  Pt denies problems with ambulating, voiding or po intake.  She denies nausea or vomiting.  Pain is well controlled.  She has had flatus. She has not had bowel movement.  Lochia Small.  Plan for birth control is undecided.  Method of Feeding: breast.   Of note, patient had fever up to 102.46F during labor. S/p Amp and Gent x 1 dose. Afebrile overnight.   Objective: BP 94/64 mmHg  Pulse 74  Temp(Src) 97.8 F (36.6 C) (Oral)  Resp 18  Ht 5\' 6"  (1.676 m)  Wt 104.327 kg (230 lb)  BMI 37.14 kg/m2  SpO2 98%  LMP 12/09/2014 (Approximate)  Breastfeeding? Unknown  Physical Exam:  General: alert, cooperative and no distress Lochia:normal flow Chest: CTAB Heart: RRR no m/r/g Abdomen: +BS, soft, nontender, fundus firm at/below umbilicus Uterine Fundus: firm DVT Evaluation: No evidence of DVT seen on physical exam. Extremities: no edema  No results for input(s): HGB, HCT in the last 72 hours.  Assessment/Plan:  ASSESSMENT: Morgan Ramsey is a 40 y.o. [id number] [redacted]w[redacted]d ppd #1 s/p NSVD doing well.   Plan for discharge tomorrow   LOS: 3 days   Smiley Houseman 08/30/2015, 8:53 AM

## 2015-08-30 NOTE — Anesthesia Postprocedure Evaluation (Signed)
Anesthesia Post Note  Patient: Morgan Ramsey  Procedure(s) Performed: * No procedures listed *  Patient location during evaluation: Mother Baby Anesthesia Type: Epidural Level of consciousness: awake and alert Pain management: pain level controlled Vital Signs Assessment: post-procedure vital signs reviewed and stable Respiratory status: spontaneous breathing Cardiovascular status: stable Postop Assessment: no headache, no backache, no signs of nausea or vomiting and adequate PO intake Anesthetic complications: no    Last Vitals:  Filed Vitals:   08/29/15 2109 08/30/15 0507  BP: 98/86 94/64  Pulse: 86 74  Temp: 36.4 C 36.6 C  Resp: 18 18    Last Pain:  Filed Vitals:   08/30/15 0701  PainSc: 2                  Abdimalik Mayorquin Hristova

## 2015-08-30 NOTE — Progress Notes (Signed)
I ordered pt dinner and snack, also I assisted Lactation with some information, Juliann Mule

## 2015-08-31 NOTE — Discharge Instructions (Signed)
Cuidados en el postparto luego de un parto vaginal  °(Postpartum Care After Vaginal Delivery) °Después del parto (período de postparto), la estadía normal en el hospital es de 24-72 horas. Si hubo problemas con el trabajo de parto o el parto, o si tiene otros problemas médicos, es posible que deba permanecer en el hospital por más tiempo.  °Mientras esté en el hospital, recibirá ayuda e instrucciones sobre cómo cuidar de usted misma y de su bebé recién nacido durante el postparto.  °Mientras esté en el hospital:  °· Asegúrese de decirle a las enfermeras si siente dolor o malestar, así como donde siente el dolor y qué empeora el dolor. °· Si usted tuvo una incisión cerca de la vagina (episiotomía) o si ha tenido algún desgarro durante el parto, las enfermeras le pondrán hielo sobre la episiotomía o el desgarro. Las bolsas de hielo pueden ayudar a reducir el dolor y la hinchazón. °· Si está amamantando, puede sentir contracciones dolorosas en el útero durante algunas semanas. Esto es normal. Las contracciones ayudan a que el útero vuelva a su tamaño normal. °· Es normal tener algo de sangrado después del parto. °¨ Durante los primeros 1-3 días después del parto, el flujo es de color rojo y la cantidad puede ser similar a un período. °¨ Es frecuente que el flujo se inicie y se detenga. °¨ En los primeros días, puede eliminar algunos coágulos pequeños. Informe a las enfermeras si elimina coágulos grandes o aumenta el flujo. °¨ No  elimine los coágulos de sangre por el inodoro antes de que la enfermera los vea. °¨ Durante los próximos 3 a 10 días después del parto, el flujo debe ser más acuoso y rosado o marrón. °¨ De diez a catorce días después del parto, el flujo debe ser una pequeña cantidad de secreción de color blanco amarillento. °¨ La cantidad de flujo disminuirá en las primeras semanas después del parto. El flujo puede detenerse en 6-8 semanas. La mayoría de las mujeres no tienen más flujo a las 12 semanas  después del parto. °· Usted debe cambiar sus apósitos con frecuencia. °· Lávese bien las manos con agua y jabón durante al menos 20 segundos después de cambiar el apósito, usar el baño o antes de sostener o alimentar a su recién nacido. °· Usted podrá sentir como que tiene que vaciar la vejiga durante las primeras 6-8 horas después del parto. °· En caso de que sienta debilidad, mareo o desmayo, llame a la enfermera antes de levantarse de la cama por primera vez y antes de tomar una ducha por primera vez. °· Dentro de los primeros días después del parto, sus mamas pueden comenzar a estar sensibles y llenas. Esto se llama congestión. La sensibilidad en los senos por lo general desaparece dentro de las 48-72 horas después de que ocurre la congestión. También puede notar que la leche se escapa de sus senos. Si no está amamantando no estimule sus pechos. La estimulación de las mamas hace que sus senos produzcan más leche. °· Pasar tanto tiempo como le sea posible con el bebé recién nacido es muy importante. Durante ese tiempo, usted y su bebé deben sentirse cerca y conocerse uno al otro. Tener al bebé en su habitación (alojamiento conjunto) ayudará a fortalecer el vínculo con el bebé recién nacido. Esto le dará tiempo para conocerlo y atenderlo de manera cómoda. °· Las hormonas se modifican después del parto. A veces, los cambios hormonales pueden causar tristeza o ganas de llorar por un tiempo. Estos sentimientos   no deben durar más de unos pocos días. Si duran más que eso, debe hablar con su médico. °· Si lo desea, hable con su médico acerca de los métodos de planificación familiar o métodos anticonceptivos. °· Hable con su médico acerca de las vacunas. El médico puede indicarle que se aplique las siguientes vacunas antes de salir del hospital: °¨ Vacuna contra el tétanos, la difteria y la tos ferina (Tdap) o el tétanos y la difteria (Td). Es muy importante que usted y su familia (incluyendo a los abuelos) u otras  personas que cuidan al recién nacido estén al día con las vacunas Tdap o Td. Las vacunas Tdap o Td pueden ayudar a proteger al recién nacido de enfermedades. °¨ Inmunización contra la rubéola. °¨ Inmunización contra la varicela. °¨ Inmunización contra la gripe. Usted debe recibir esta vacunación anual si no la ha recibido durante el embarazo. °  °Esta información no tiene como fin reemplazar el consejo del médico. Asegúrese de hacerle al médico cualquier pregunta que tenga. °  °Document Released: 05/28/2007 Document Revised: 04/24/2012 °Elsevier Interactive Patient Education ©2016 Elsevier Inc. ° °

## 2015-08-31 NOTE — Progress Notes (Signed)
I ordered patient's lunch.  Morgan Ramsey  Interpreter. °

## 2015-08-31 NOTE — Discharge Summary (Signed)
OB Discharge Summary     Patient Name: Morgan Ramsey DOB: [birth year] MRN: [id number]  Date of admission: 08/27/2015 Delivering MD: Koren Shiver D   Date of discharge: 08/31/2015  Admitting diagnosis: INDUCTION for GDM Intrauterine pregnancy: [redacted]w[redacted]d     Secondary diagnosis:  Active Problems:   Normal labor and delivery  Additional problems: Class A1DM, VBAC, AMA     Discharge diagnosis: Term Pregnancy Delivered, VBAC and GDM A2                                                                                                Post partum procedures:none  Augmentation: pitocin  Complications: None  Hospital course:  Induction of Labor With Vaginal Delivery   40 y.o. yo [phone] at [redacted]w[redacted]d was admitted to the hospital 08/27/2015 for induction of labor.  Indication for induction: A2 DM.  Patient had an uncomplicated labor course as follows: Membrane Rupture Time/Date: 9:25 PM ,08/28/2015   Intrapartum Procedures: Episiotomy: None [1]                                         Lacerations:  2nd degree [3]  Patient had delivery of a Viable infant.  Information for the patient's newborn:  Morgan Ramsey, Morgan Ramsey [id number]  Delivery Method: VBAC, Spontaneous (Filed from Delivery Summary)   08/29/2015  Details of delivery can be found in separate delivery note.  Patient had a routine postpartum course. Patient is discharged home 08/31/2015.   Physical exam  Filed Vitals:   08/29/15 2109 08/30/15 0507 08/30/15 1819 08/31/15 0646  BP: 98/86 94/64 119/73 119/62  Pulse: 86 74 74 79  Temp: 97.5 F (36.4 C) 97.8 F (36.6 C) 98.3 F (36.8 C) 98.3 F (36.8 C)  TempSrc: Oral  Oral Oral  Resp: 18 18 18 18   Height:      Weight:      SpO2:       General: alert, cooperative and no distress Lochia: appropriate Uterine Fundus: firm Incision: Healing well with no significant drainage, No significant erythema, Dressing is clean, dry, and intact DVT Evaluation: No evidence of DVT seen  on physical exam. Labs: Lab Results  Component Value Date   WBC 8.9 08/27/2015   HGB 12.5 08/27/2015   HCT 37.8 08/27/2015   MCV 73.0* 08/27/2015   PLT 235 08/27/2015   CMP Latest Ref Rng 04/29/2015  Glucose 65 - 99 mg/dL 96  BUN 7 - 25 mg/dL 8  Creatinine 0.50 - 1.10 mg/dL 0.51  Sodium 135 - 146 mmol/L 137  Potassium 3.5 - 5.3 mmol/L 4.2  Chloride 98 - 110 mmol/L 104  CO2 20 - 31 mmol/L 25  Calcium 8.6 - 10.2 mg/dL 9.0  Total Protein 6.1 - 8.1 g/dL 6.5  Total Bilirubin 0.2 - 1.2 mg/dL 0.2  Alkaline Phos 33 - 115 U/L 80  AST 10 - 30 U/L 13  ALT 6 - 29 U/L 13    Discharge instruction: per After Visit Summary and "Baby and Me Booklet".  After visit meds:    Medication List    TAKE these medications        ACCU-CHEK FASTCLIX LANCETS Misc  1 Units by Percutaneous route 4 (four) times daily.     acetaminophen 500 MG tablet  Commonly known as:  TYLENOL  Take 1,000 mg by mouth every 6 (six) hours as needed for mild pain.     aspirin EC 81 MG tablet  Take 1 tablet (81 mg total) by mouth daily.     ferrous sulfate 325 (65 FE) MG tablet  Take 325 mg by mouth daily with breakfast.     glucose blood test strip  Commonly known as:  ACCU-CHEK SMARTVIEW  Use as instructed to check blood sugars     glyBURIDE 5 MG tablet  Commonly known as:  DIABETA  Take 2 tablet at breakfast and 1 tablet at bedtime     metFORMIN 500 MG tablet  Commonly known as:  GLUCOPHAGE  Take 1 tablet (500 mg total) by mouth 2 (two) times daily with a meal.     prenatal multivitamin Tabs tablet  Take 1 tablet by mouth daily at 12 noon.     valACYclovir 1000 MG tablet  Commonly known as:  VALTREX  Take 1 tablet (1,000 mg total) by mouth daily.        Diet: carb modified diet  Activity: Advance as tolerated. Pelvic rest for 6 weeks.   Outpatient follow up:6 weeks Follow up Appt:Future Appointments Date Time Provider [place]  10/04/2015 1:45 PM Morgan Mode, MD [facility]    Follow up Visit:No Follow-up on file. Follow-up Information    Follow up with [facility] 6 weeks.   Specialty:  Obstetrics and Gynecology   Why:  post partum check    Contact information:   [place] [phone]    [ ] Needs 2 hour GTT at [facility] visit  Postpartum contraception: Nexplanon  Newborn Data: Live born female  Birth Weight: 8 lb 9.4 oz (3895 g) APGAR: 9, 9  Baby Feeding: Breast Disposition:home with mother OP circ  08/31/2015 Cyndia Diver, MD  I was present for the exam and agree with above.  [place], CNM 08/31/2015 11:12 AM

## 2015-08-31 NOTE — Progress Notes (Signed)
I ordered patient's meals, by Juliann Mule Spanish Interpreter

## 2015-08-31 NOTE — Lactation Note (Signed)
This note was copied from the chart of [place]. Lactation Consultation Note  Patient Name: Morgan Ramsey [id number] Date: 08/31/2015 Reason for consult: Follow-up assessment;Hyperbilirubinemia   Mom finished pumping, received a few gtts colostrum in pump, enc her to place on finger and rub in infants mouth. Showed mom how to hand express and gave her colostrum collection containers. Mom returned demo, no milk seen in with several attempts from left breast. Will need to f/u.   Maternal Data Formula Feeding for Exclusion: No Does the patient have breastfeeding experience prior to this delivery?: Yes  Feeding    LATCH Score/Interventions                      Lactation Tools Discussed/Used WIC Program: Yes Pump Review: Setup, frequency, and cleaning;Milk Storage Initiated by:: Nonah Mattes, RN, IBCLC Date initiated:: 08/31/15   Consult Status Consult Status: Follow-up Date: 09/01/15 Follow-up type: In-patient    Debby Freiberg Maysen Sudol 08/31/2015, 11:05 AM

## 2015-08-31 NOTE — Lactation Note (Signed)
This note was copied from the chart of [place]. Lactation Consultation Note Follow up visit at 54 hours of age requested by [facility] RN for baby to be discharged.  Last feed recorded by [facility] RN is a 9 with swallows.  Mom reports a recent feeding that [facility] RN observed, but RN reports only seeing the end, no swallows at that time.  Encouraged mom to continue pumping at home.  WIC loaner pump given.  Mom to call for [facility] appt. Tomorrow.  Baby also has follow up with peds tomorrow.  Discussed milk coming to volume and engorgement care.  LC observed mom latch baby with deep latch, but only a few sucks and baby is sleepy.  Explained to mom difference in swallowing for feeding and non nutritive sucking.  Mom to keep record of intake/output and emesis to share with peds at next appt.  Mom to call for assist as needed using o/p services.    Patient Name: Morgan Ramsey [id number] Date: 08/31/2015 Reason for consult: Follow-up assessment   Maternal Data    Feeding Feeding Type: Breast Fed Length of feed:  (sucks a few times to sleep)  LATCH Score/Interventions Latch: Repeated attempts needed to sustain latch, nipple held in mouth throughout feeding, stimulation needed to elicit sucking reflex. Intervention(s): Adjust position;Assist with latch;Breast massage;Breast compression  Audible Swallowing: A few with stimulation Intervention(s): Skin to skin;Hand expression  Type of Nipple: Everted at rest and after stimulation  Comfort (Breast/Nipple): Soft / non-tender     Hold (Positioning): Assistance needed to correctly position infant at breast and maintain latch. Intervention(s): Breastfeeding basics reviewed;Support Pillows;Position options;Skin to skin  LATCH Score: 7  Lactation Tools Discussed/Used     Consult Status Consult Status: Complete Date: 08/31/15    Justice Britain 08/31/2015, 6:26 PM

## 2015-08-31 NOTE — Lactation Note (Signed)
This note was copied from the chart of [place]. Lactation Consultation Note  Patient Name: Morgan Ramsey [id number] Date: 08/31/2015 Reason for consult: Follow-up assessment;Hyperbilirubinemia   Follow up with mom of 81 hour old infant. Spoke with mom through [employer] # [phone].  Infant with 11 BF for 10-35 minutes, 2 attempts, 7 syringe feedings of formula of 3-12 cc, 4 voids and 7 stools in last 24 hours. Infant weight 8 lb 3 oz with weight loss 5% since birth. LATCH Scores 6-8 by bedside RN's.  Infant with hyperbilirubianemia with increased bilirubin this am. Infant on double phototherapy. Infant currently asleep under bili lights. Mom reports she has not given formula recently as they do not want him to have any more formula. Asked mom if she would begin pumping. Mom agreed. Set up DEBP and asked mom to pump every 3 hours on Initiate setting for 15 minutes. Discussed set up, pump settings, cleaning and storage of EBM.  Mom began pumping when I left the room. Instructed mom that any and all EBM is to be fed to infant using syringe. Mom voiced understanding to all teaching. Her only questions is whether she can go home, told her that is up to Pediatrician. Infant has f/u Ped. appt tomorrow at 11:15. Mom is a [facility] client and [facility] loaner pump was discussed in the event mom needs to continue pumping after d/c.  Mom voiced understanding. Enc mom to call with questions/concerns. Discussed with Jake Bathe RN.    Maternal Data Formula Feeding for Exclusion: No Does the patient have breastfeeding experience prior to this delivery?: Yes  Feeding    LATCH Score/Interventions                      Lactation Tools Discussed/Used WIC Program: Yes Pump Review: Setup, frequency, and cleaning;Milk Storage Initiated by:: Nonah Mattes, RN, IBCLC Date initiated:: 08/31/15   Consult Status Consult Status: Follow-up Date: 09/01/15 Follow-up type:  In-patient    Debby Freiberg Apollonia Amini 08/31/2015, 10:52 AM

## 2015-10-04 ENCOUNTER — Ambulatory Visit (INDEPENDENT_AMBULATORY_CARE_PROVIDER_SITE_OTHER): Payer: Medicaid Other | Admitting: Obstetrics & Gynecology

## 2015-10-04 VITALS — BP 120/70 | HR 79 | Temp 100.0°F | Wt 214.4 lb

## 2015-10-04 DIAGNOSIS — Z30017 Encounter for initial prescription of implantable subdermal contraceptive: Secondary | ICD-10-CM

## 2015-10-04 NOTE — Progress Notes (Signed)
Subjective:     Morgan Ramsey is a 40 y.o. female who presents for a postpartum visit. She is 6 weeks postpartum following a VBAC. I have fully reviewed the prenatal and intrapartum course. The delivery was at 65 gestational weeks. Outcome: vaginal birth after cesarean (VBAC). Anesthesia: epidural. Postpartum course has been good except left lower back pain. Baby's course has been good. Baby is feeding by breast. Bleeding red. Bowel function is normal. Bladder function is normal. Patient is sexually active. Contraception method is Nexplanon. Postpartum depression screening: negative.  The following portions of the patient's history were reviewed and updated as appropriate: allergies, current medications, past family history, past medical history, past social history, past surgical history and problem list.  Review of Systems Pertinent items are noted in HPI.   Objective:    BP 120/70 mmHg  Pulse 79  Temp(Src) 100 F (37.8 C)  Wt 214 lb 6.4 oz (97.251 kg)  General:  alert, cooperative and no distress           Abdomen: soft, non-tender; bowel sounds normal; no masses,  no organomegaly   Vulva:  not evaluated                       Patient given informed consent, signed copy in the chart, time out was performed. Pregnancy test was neg Appropriate time out taken.  Patient's left arm was prepped and draped in the usual sterile fashion.. The ruler used to measure and mark insertion area.  Pt was prepped with alcohol swab and then injected with 3 cc of 1 % lidocaine.  Pt was prepped with betadine, Nexplanon removed form packaging. Then inserted per standard guidelines. Patient and provider were able to palpate rod under skin. Pt insertion site covered with sterile dressing.   Minimal blood loss.  Pt tolerated the procedure well. Instructed to not disturb insertion site which could dislodge capsule  Assessment:     normal postpartum exam with MS back pain. Pap smear not done at  today's visit.   Plan:    1. Contraception: Nexplanon 2. Nexplanon place. Straight leg raise neg for sciatica.  3. Follow up  as needed.    Woodroe Mode, MD 10/04/2015

## 2015-10-04 NOTE — Patient Instructions (Addendum)
Dolor de espalda en adultos (Back Pain, Adult) El dolor de espalda es muy frecuente en los adultos.La causa del dolor de espalda es rara vez peligrosa y Conservation officer, historic buildings a menudo mejora con el [place].Es posible que se desconozca la causa de esta afeccin. Algunas causas comunes son las siguientes:  Distensin de los msculos o ligamentos que sostienen la columna vertebral.  Holiday representative (degeneracin) de los discos vertebrales.  Artritis.  Lesiones directas en la espalda. En [employer], el dolor de espalda es recurrente. Como rara vez es peligroso, las personas pueden aprender a Holiday representative afeccin por s mismas. INSTRUCCIONES PARA EL CUIDADO EN EL HOGAR Controle su dolor de espalda a fin de Recruitment consultant cambio. Las siguientes indicaciones ayudarn a Chief Strategy Officer que pueda sentir:  IT consultant. Si permanece sentado o de pie en un mismo lugar durante mucho tiempo, se tensiona la espalda. No se siente, conduzca o permanezca de pie en un mismo lugar durante ms de 30 minutos seguidos. Realice caminatas cortas en superficies planas tan pronto como le sea posible.Trate de caminar un poco ms de Publishing copy.  Haga ejercicio regularmente como se lo haya indicado el mdico. El ejercicio ayuda a que su espalda se cure ms rpidamente. Tambin ayuda a prevenir futuras lesiones al [employer] fuertes y flexibles.  No permanezca en la cama.Si hace reposo ms de 1 a 2 das, puede demorar su recuperacin.  Preste atencin a su cuerpo al inclinarse y levantarse. Las posiciones ms cmodas son las que ejercen menos tensin en la espalda en recuperacin. Siempre use tcnicas apropiadas para levantar objetos, como por ejemplo:  Flexionar las rodillas.  Mantener la carga cerca del cuerpo.  No torcerse.  Encuentre una posicin cmoda para dormir. Use un colchn firme y recustese de costado con las rodillas ligeramente flexionadas. Si se recuesta [employer], coloque  una almohada debajo de las rodillas.  Evite sentir ansiedad o estrs.El estrs aumenta la tensin muscular y puede empeorar el dolor de espalda.Es importante reconocer si se siente ansioso o estresado y aprender maneras de controlarlo, por ejemplo haciendo ejercicio.  Tome los medicamentos solamente como se lo haya indicado el mdico. Los medicamentos de venta libre para Best boy y la inflamacin a menudo son los ms eficaces.El mdico puede recetarle relajantes musculares.Estos medicamentos ayudan a Glass blower/designer de modo que pueda reanudar ms rpidamente sus actividades normales y el ejercicio saludable.  Aplique hielo sobre la zona lesionada.  Ponga el hielo en una bolsa plstica.  Coloque una toalla entre la piel y la bolsa de hielo.  Deje el hielo durante 43minutos, 2 a 3veces por da, durante los primeros 2 o 3das. Despus de eso, puede alternar el hielo y el calor para reducir Conservation officer, historic buildings y los espasmos.  Mantenga un peso saludable. El exceso de peso ejerce presin adicional sobre la espalda y hace que resulte difcil mantener una buena [place]. SOLICITE ATENCIN MDICA SI:  Siente un dolor que no se alivia con reposo o medicamentos.  Siente mucho dolor que se extiende a las piernas o los glteos.  El dolor no mejora en una semana.  Siente dolor por la noche.  Pierde peso.  Siente escalofros o fiebre. SOLICITE ATENCIN MDICA DE INMEDIATO SI:   Tiene nuevos problemas para controlar la vejiga o los intestinos.  Siente debilidad o adormecimiento inusuales en los brazos o en las piernas.  Siente nuseas o vmitos.  Siente dolor abdominal.  Siente que va a desmayarse.  Esta informacin no tiene Marine scientist el consejo del mdico. Asegrese de hacerle al mdico cualquier pregunta que tenga.   Document Released: 07/31/2005 Document Revised: 08/21/2014 Elsevier Interactive Patient Education 2016 [place] es este  medicamento? El ETONOGESTREL es un dispositivo anticonceptivo (control de la natalidad). Se utiliza para Patent attorney. Se puede utilizar hasta 3 aos. Este medicamento puede ser utilizado para otros usos; si tiene alguna pregunta consulte con su proveedor de atencin mdica o con su farmacutico. Qu le debo informar a mi profesional de la salud antes de tomar este medicamento? Necesita saber si usted presenta alguno de los siguientes problemas o situaciones: sangrado vaginal anormal enfermedad vascular o cogulos sanguneos cncer de mama, cervical, heptico depresin diabetes enfermedad de la vescula biliar dolores de cabeza enfermedad cardiaca o ataque cardiaco reciente alta presin sangunea alto nivel de colesterol enfermedad renal enfermedad heptica convulsiones fuma tabaco una reaccin alrgica o inusual al etonogestrel, otras hormonas, anestsicos o antispticos, medicamentos, alimentos, colorantes o conservantes si est embarazada o buscando quedar embarazada si est amamantando a un beb Cmo debo [employer]? Este dispositivo se inserta debajo de la piel en la cara interna de la parte superior del brazo por un profesional de Technical sales engineer. Hable con su pediatra para informarse acerca del uso de este medicamento en nios. Puede requerir atencin especial. Sobredosis: Pngase en contacto inmediatamente con un centro toxicolgico o una sala de urgencia si usted cree que haya tomado demasiado medicamento. ATENCIN: [employer] es solo para usted. No comparta este medicamento con nadie. Qu sucede si me olvido de una dosis? No se aplica en este caso. Qu puede interactuar con este medicamento? No tome esta medicina con ninguno de los siguientes medicamentos: amprenavir bosentano fosamprenavir Esta medicina tambin puede interactuar con los siguientes medicamentos: medicamentos barbitricos para inducir el sueo o tratar convulsiones ciertos medicamentos para las  infecciones micticas tales como quetoconazol e itraconazol griseofulvina medicamentos para tratar convulsiones, tales como carbamazepina, felbamato, Radio producer, fenitona, topiramato modafinil fenilbutazona rifampicina algunos medicamentos para tratar la infeccin por VIH tales como atazanavir, indinavir, lopinavir, nelfinavir, tipranavir, ritonavir hierba de [place] Puede ser que esta lista no menciona todas las posibles interacciones. Informe a su profesional de [employer]	[place] de [employer] productos a base de hierbas, medicamentos de [place] o suplementos nutritivos que est tomando. Si usted fuma, consume bebidas alcohlicas o si utiliza drogas ilegales, indqueselo tambin a su profesional de KB Home	[place]. Algunas sustancias pueden interactuar con su medicamento. A qu debo estar atento al usar [employer]a? Este producto no protege contra la infeccin por el VIH ([place]) u otras enfermedades de transmisin sexual. Usted debe sentir el implante al presionar con las yemas de los dedos sobre la piel donde se insert. Contacte a su mdico si no se siente el implante y [place] un mtodo anticonceptivo no hormonal (como el condn) hasta que el mdico confirma que el implante est en su Environmental consultant. Si siente que el implante puede haber roto o doblado en su brazo, pngase en contacto con su proveedor de atencin mdica. Qu efectos secundarios puedo tener al [employer] este medicamento? Efectos secundarios que debe informar a su mdico o a Barrister's clerk de la salud tan pronto como sea posible: Chief of Staff como erupcin cutnea, picazn o urticarias, hinchazn de la cara, labios o lengua ndulos mamarios cambios de emociones o humor humor deprimido sangrado menstrual prolongado o abundante dolor, irritacin, hichazn o Ship broker de la insercin Architectural technologist  lugar de la insercin signos de infeccin en TEFL teacher de la insercin, tales como fiebre y enrojecimiento, dolor o descarga de la piel  signos de Media planner signos y sntomas de un cogulo sanguneo tales como problemas respiratorios; cambios en la visin; dolor en el pecho; dolor de cabeza severo, repentino; dolor, hinchazn, clida en la pierna; dificultad para hablar; entumecimiento o debilidad repentina de la cara, brazo o pierna signos y sntomas de lesin al hgado como orina amarillo oscuro o Forensic psychologist; sensacin general de estar enfermo o sntomas gripales; heces claras; prdida de apetito; nuseas; dolor en la regin abdominal superior derecha; cansancio o debilidad inusual; color amarillento de los ojos o la piel sangrado, flujo vaginal inusual signos y sntomas de un derrame cerebral tales como cambios en la visin; confusin; dificultad para hablar o entender; dolores de cabeza severos; entumecimiento o debilidad repentina de la cara, brazo o pierna; dificultad para andar; Tree surgeon; prdida del equilibrio o coordinacin Efectos secundarios que, por lo general, no requieren Geophysical data processor (debe informarlos a su mdico o a Barrister's clerk de la salud si persisten o si son molestos): acn dolor de espalda dolor de pecho cambios de peso mareos sensacin general de estar enfermo o sntomas gripales dolor de cabeza sangrado menstrual irregular nuseas dolor de garganta irritacin o inflamacin vaginal Puede ser que esta lista no menciona todos los posibles efectos secundarios. Comunquese a su mdico por asesoramiento mdico [employer]. Usted puede informar los efectos secundarios a la FDA por telfono al 1-800-FDA-1088. Dnde debo guardar mi medicina? Este medicamento se administra en hospitales o clnicas y no necesitar guardarlo en su domicilio. ATENCIN: Este folleto es un resumen. Puede ser que no cubra toda la posible informacin. Si usted tiene preguntas acerca de esta medicina, consulte con su mdico, su farmacutico o su profesional de Technical sales engineer.    2016, Elsevier/Gold Standard. (2014-09-23 00:00:00)

## 2015-10-06 ENCOUNTER — Encounter: Payer: Self-pay | Admitting: Obstetrics & Gynecology

## 2015-10-11 ENCOUNTER — Telehealth: Payer: Self-pay | Admitting: General Practice

## 2015-10-11 NOTE — Telephone Encounter (Signed)
Patient called and left message in [place] stating she started bleeding but not much and is requesting call back. Called patient with [employer] for interpreter and left message stating we are trying to reach you to return your phone call, please call us back at the clinics.

## 2015-10-11 NOTE — Telephone Encounter (Signed)
Called patient using Spanish interpreter [phone]. Patient had a Nexplanon placed on 2/20, is complaining of bleeding daily. It is off and on. Wants to know if this is normal. Explained to patient that the [place] may cause irregular bleeding and is usually no cause for concern. This can be for up to 6 months after placement. Patient asked if she can have it removed. i advised that since it was just placed she should give her body time to adjust and after 6 months if bleeding continues to be an issue she can have it removed. Understanding voiced. Patient also had a concern that something had been "left in" when the Nexplanon was placed. Stated she keeps feeling something sharp protruding from her skin. She said there was something black and hard and it didn't come off when she picked at it. I told her to avoid picking at the site because she could get an infection. I asked if this could be a scab and she said she did not think so. I advised that if she felt there was a problem at the site then she needed to come in to the clinic to have it checked. Patient voiced understanding, stated she has to have a lab draw on Wed this week. I notified the front desk via inbox that she needed a nurse visit during this time as well.

## 2015-10-13 ENCOUNTER — Other Ambulatory Visit: Payer: Self-pay

## 2015-10-14 ENCOUNTER — Other Ambulatory Visit: Payer: Self-pay

## 2015-10-14 DIAGNOSIS — O24919 Unspecified diabetes mellitus in pregnancy, unspecified trimester: Secondary | ICD-10-CM

## 2015-10-14 NOTE — Progress Notes (Signed)
Morgan Ramsey here fro 2 hour GTT. Also c/o nexplanon site. Used Interpreter [employer]. Patient reports she feels like nexplanon is poking out like a needle. Nurse observed site, noted scab at insertion site, no redness or edema. , difficult to palpate nexplanon.  Asked Dr. Harolyn Rutherford to assess site. She assessed site and we informed patient she could palpate nexplanon, scab removed , no problems noted.  We also discussed her c/o bleeding- we discussed irregular bleeding is to be expected the first 3-6 months as her body adjusts.  She voices understanding.

## 2015-10-15 ENCOUNTER — Other Ambulatory Visit: Payer: Self-pay

## 2015-10-15 LAB — GLUCOSE TOLERANCE, 2 HOURS
GLUCOSE, 2 HOUR: 91 mg/dL (ref <140)
Glucose, Fasting: 100 mg/dL — ABNORMAL HIGH (ref 65–99)

## 2015-10-27 ENCOUNTER — Telehealth: Payer: Self-pay | Admitting: *Deleted

## 2015-10-27 NOTE — Telephone Encounter (Signed)
Called patient using Spanish interpreter [phone]. Spoke to patient about 2hr gtt results. Told her that the result was ok and that she should follow up with her PCP as needed. Patient asked if that meant that she did not have diabetes. I told her that the actual number was one higher than what was considered normal. If she starts exhibiting signs such as frequent urination, extreme thirst or hunger, that these sometimes can indicate a problem and she should go to her PCP. Understanding voiced.

## 2015-10-27 NOTE — Telephone Encounter (Signed)
-----   Message from Woodroe Mode, MD sent at 10/22/2015 12:38 PM EST ----- F/u with PCP prn

## 2017-03-15 NOTE — Telephone Encounter (Signed)
See note

## 2018-07-07 ENCOUNTER — Encounter (HOSPITAL_COMMUNITY): Payer: Self-pay | Admitting: Emergency Medicine

## 2018-07-07 ENCOUNTER — Emergency Department (HOSPITAL_COMMUNITY): Payer: Self-pay

## 2018-07-07 ENCOUNTER — Emergency Department (HOSPITAL_COMMUNITY)
Admission: EM | Admit: 2018-07-07 | Discharge: 2018-07-07 | Disposition: A | Discharge status: Home / Self Care | Payer: Self-pay | Attending: Emergency Medicine | Admitting: Emergency Medicine

## 2018-07-07 ENCOUNTER — Other Ambulatory Visit: Payer: Self-pay

## 2018-07-07 DIAGNOSIS — R0789 Other chest pain: Secondary | ICD-10-CM | POA: Insufficient documentation

## 2018-07-07 DIAGNOSIS — R739 Hyperglycemia, unspecified: Secondary | ICD-10-CM | POA: Insufficient documentation

## 2018-07-07 DIAGNOSIS — M791 Myalgia, unspecified site: Secondary | ICD-10-CM | POA: Insufficient documentation

## 2018-07-07 DIAGNOSIS — T148XXA Other injury of unspecified body region, initial encounter: Secondary | ICD-10-CM

## 2018-07-07 LAB — BASIC METABOLIC PANEL
ANION GAP: 9 (ref 5–15)
BUN: 11 mg/dL (ref 6–20)
CALCIUM: 9.3 mg/dL (ref 8.9–10.3)
CO2: 25 mmol/L (ref 22–32)
Chloride: 98 mmol/L (ref 98–111)
Creatinine, Ser: 0.94 mg/dL (ref 0.44–1.00)
GLUCOSE: 339 mg/dL — AB (ref 70–99)
Potassium: 3.7 mmol/L (ref 3.5–5.1)
Sodium: 132 mmol/L — ABNORMAL LOW (ref 135–145)

## 2018-07-07 LAB — CBC
HCT: 36.6 % (ref 36.0–46.0)
Hemoglobin: 10.8 g/dL — ABNORMAL LOW (ref 12.0–15.0)
MCH: 20.5 pg — AB (ref 26.0–34.0)
MCHC: 29.5 g/dL — ABNORMAL LOW (ref 30.0–36.0)
MCV: 69.6 fL — ABNORMAL LOW (ref 80.0–100.0)
NRBC: 0 % (ref 0.0–0.2)
PLATELETS: 314 10*3/uL (ref 150–400)
RBC: 5.26 MIL/uL — AB (ref 3.87–5.11)
RDW: 15.4 % (ref 11.5–15.5)
WBC: 7.3 10*3/uL (ref 4.0–10.5)

## 2018-07-07 LAB — I-STAT TROPONIN, ED: TROPONIN I, POC: 0 ng/mL (ref 0.00–0.08)

## 2018-07-07 LAB — I-STAT BETA HCG BLOOD, ED (MC, WL, AP ONLY): I-stat hCG, quantitative: <5 m[IU]/mL (ref <5)

## 2018-07-07 MED ORDER — KETOROLAC TROMETHAMINE 30 MG/ML IJ SOLN
30.0000 mg | Freq: Once | INTRAMUSCULAR | Status: AC
  Administered 2018-07-07: 30 mg via INTRAVENOUS
  Filled 2018-07-07: qty 1

## 2018-07-07 MED ORDER — LIDOCAINE 5 % EX PTCH
1.0000 | MEDICATED_PATCH | CUTANEOUS | Status: DC
  Administered 2018-07-07: 1 via TRANSDERMAL
  Filled 2018-07-07: qty 1

## 2018-07-07 MED ORDER — METFORMIN HCL 500 MG PO TABS: 500.0000 mg | ORAL_TABLET | Freq: Two times a day (BID) | ORAL | 1 refills | Status: DC

## 2018-07-07 NOTE — Discharge Summary (Signed)
Discharge instructions reviewed with patient. All questions answered. Patient ambulated to vehicle with belongings by nurse

## 2018-07-07 NOTE — ED Triage Notes (Signed)
Pt endorses chest pain and nausea, shob and more pain with movement and deep breaths. Left arm radiation.

## 2018-07-07 NOTE — Discharge Instructions (Addendum)
Puede tomar Motrin (Ibuprofen) o Aleve (Naproxen), Acetaminophen (Tylenol), crema para los musculos como SalonPas, Icy Hot, Bengay, etc. Puede estrechar, ponerce hielo o comprecion de calor, o que le den masaje. ° °

## 2018-07-07 NOTE — ED Provider Notes (Signed)
[facility] EMERGENCY DEPARTMENT Provider Note   CSN: [id number] Arrival date & time: 07/07/18  0048     History   Chief Complaint Chief Complaint  Patient presents with  . Chest Pain    HPI Morgan Ramsey is a 42 y.o. female.   42 year old female presents to the emergency department for evaluation of left-sided chest pain.  She states that pain is been constant since 1700 yesterday.  Pain waxes and wanes in severity and is aggravated with certain movements as well as deep breathing.  She has had some shortness of breath with her pain due to her limitation in taking a deep breath.  Also reports some nausea as well as radiation to her left arm.  She has not had any vomiting, diaphoresis.  Took some Tylenol prior to arrival without significant relief.  Has had improvement to pain with resting/immobility.  No use of birth control.  No recent surgeries/hospitalizations.  Denies PHx of ACS, HTN, HLD, tobacco use.  Hx of gestational diabetes without current use of medications.  No FHx of ACS.   Chest Pain      Past Medical History:  Diagnosis Date  . AMA (advanced maternal age) multigravida 19+   . Gestational diabetes    glyburide  . Herpes   . History of candidal vulvovaginitis   . History of chlamydia 2008  . Obese     Patient Active Problem List   Diagnosis Date Noted  . Language barrier 04/26/2015  . H/O herpes simplex type 2 infection 04/26/2015  . Previous cesarean section 04/26/2015  . Obesity 04/26/2015  . Anemia 04/26/2015    Past Surgical History:  Procedure Laterality Date  . CESAREAN SECTION  05/21/2011   Procedure: CESAREAN SECTION;  Surgeon: Jonnie Kind, MD;  Location: [facility];  Service: Gynecology;  Laterality: N/A;  Primary cesarean section with delivery of baby girl at 80. Apgars 9/9.     OB History    Gravida  4   Para  3   Term  3   Preterm      AB  1   Living  3     SAB      TAB  1   Ectopic      Multiple  0   Live Births  3                   Home Medications    Prior to Admission medications   Medication Sig Start Date End Date Taking? Authorizing Provider  acetaminophen (TYLENOL) 500 MG tablet Take 1,000 mg by mouth every 6 (six) hours as needed for mild pain.     [provider]  ferrous sulfate 325 (65 FE) MG tablet Take 325 mg by mouth daily with breakfast.    [provider]  metFORMIN (GLUCOPHAGE) 500 MG tablet Take 1 tablet (500 mg total) by mouth 2 (two) times daily with a meal. 07/07/18   Antonietta Breach, PA-C  Prenatal Vit-Fe Fumarate-FA (PRENATAL MULTIVITAMIN) TABS tablet Take 1 tablet by mouth daily at 12 noon. 07/26/15   Constant, Peggy, MD    Family History Family History  Problem Relation Age of Onset  . Diabetes Mother        oral agents  . Anesthesia problems Neg Hx   . Hypotension Neg Hx   . Malignant hyperthermia Neg Hx   . Pseudochol deficiency Neg Hx     Social History Social History   Tobacco Use  .  Smoking status: Never Smoker  . Smokeless tobacco: Never Used  Substance Use Topics  . Alcohol use: No  . Drug use: No     Allergies   Patient has no known allergies.   Review of Systems Review of Systems  Cardiovascular: Positive for chest pain.  Ten systems reviewed and are negative for acute change, except as noted in the HPI.    Physical Exam Updated Vital Signs BP (!) 142/110   Pulse 81   Temp 98.6 F (37 C) (Oral)   Resp 18   SpO2 100%   Physical Exam  Constitutional: She is oriented to person, place, and time. She appears well-developed and well-nourished. No distress.  Appears uncomfortable, nontoxic.  HENT:  Head: Normocephalic and atraumatic.  Eyes: Conjunctivae and EOM are normal. No scleral icterus.  Neck: Normal range of motion.  Cardiovascular: Normal rate, regular rhythm and intact distal pulses.  Pulmonary/Chest: Effort normal. No stridor. No respiratory distress. She has no wheezes. She  exhibits tenderness.  Lungs CTAB. Splinting with deep breathing. TTP to left upper chest wall.    Musculoskeletal: Normal range of motion.  No BLE edema  Neurological: She is alert and oriented to person, place, and time. She exhibits normal muscle tone. Coordination normal.  Skin: Skin is warm and dry. No rash noted. She is not diaphoretic. No erythema. No pallor.  Psychiatric: She has a normal mood and affect. Her behavior is normal.  Nursing note and vitals reviewed.    ED Treatments / Results  Labs (all labs ordered are listed, but only abnormal results are displayed) Labs Reviewed  BASIC METABOLIC PANEL - Abnormal; Notable for the following components:      Result Value   Sodium 132 (*)    Glucose, Bld 339 (*)    All other components within normal limits  CBC - Abnormal; Notable for the following components:   RBC 5.26 (*)    Hemoglobin 10.8 (*)    MCV 69.6 (*)    MCH 20.5 (*)    MCHC 29.5 (*)    All other components within normal limits  I-STAT TROPONIN, ED  I-STAT BETA HCG BLOOD, ED ([facility], [facility], [facility] ONLY)    EKG EKG Interpretation  Date/Time:  Sunday July 07 2018 01:01:31 EST Ventricular Rate:  79 PR Interval:    QRS Duration: 83 QT Interval:  383 QTC Calculation: 439 R Axis:   62 Text Interpretation:  Sinus rhythm No significant change since last tracing Confirmed by Addison Lank [phone]) on 07/07/2018 1:13:06 AM   Radiology Dg Chest 2 View  Result Date: 07/07/2018 CLINICAL DATA:  Chest pain and nausea, shortness of breath. EXAM: CHEST - 2 VIEW COMPARISON:  Chest x-ray dated 08/10/2012. FINDINGS: Heart size and mediastinal contours are stable. Lungs are clear. No pleural effusion or pneumothorax seen. Osseous structures about the chest are unremarkable. IMPRESSION: No active cardiopulmonary disease. No evidence of pneumonia or pulmonary edema. Electronically Signed   By: Franki Cabot M.D.   On: 07/07/2018 01:29    Procedures Procedures (including  critical care time)  Medications Ordered in ED Medications  lidocaine (LIDODERM) 5 % 1 patch (1 patch Transdermal Patch Applied 07/07/18 0137)  ketorolac (TORADOL) 30 MG/ML injection 30 mg (30 mg Intravenous Given 07/07/18 0142)     Initial Impression / Assessment and Plan / ED Course  I have reviewed the triage vital signs and the nursing notes.  Pertinent labs & imaging results that were available during my care of the patient  were reviewed by me and considered in my medical decision making (see chart for details).     Patient presents to the emergency department for evaluation of chest pain.  Low suspicion for emergent cardiac etiology given reassuring workup today.  EKG is nonischemic and troponin negative.  Patient has a heart score of 1 consistent with low risk of acute coronary event.  Chest x-ray without evidence of mediastinal widening to suggest dissection.  No pneumothorax, pneumonia, pleural effusion.  Pulmonary embolus further considered; however, patient without tachycardia, tachypnea, dyspnea, hypoxia.  Patient is PERC negative.  Suspect musculoskeletal etiology as cause of pain given reproducibility on exam.  Pain also worse with movement.  She was given Toradol as well as Lidoderm patch in the ED.  Noted to be hyperglycemic.  Has history of gestational diabetes.  Will start on metformin and have encouraged outpatient primary care follow-up for recheck of sugar as well as kidney function.  Counseled on the use of NSAIDs for pain control.  Return precautions discussed and provided. Patient discharged in stable condition with no unaddressed concerns.   Final Clinical Impressions(s) / ED Diagnoses   Final diagnoses:  Chest wall pain  Muscle strain  Hyperglycemia    ED Discharge Orders         Ordered    metFORMIN (GLUCOPHAGE) 500 MG tablet  2 times daily with meals     07/07/18 0217           Antonietta Breach, PA-C 07/07/18 0228    Fatima Blank,  MD 07/07/18 [phone]

## 2019-03-11 ENCOUNTER — Other Ambulatory Visit: Payer: Self-pay

## 2019-03-11 ENCOUNTER — Encounter: Payer: Self-pay | Admitting: *Deleted

## 2019-03-11 ENCOUNTER — Ambulatory Visit (INDEPENDENT_AMBULATORY_CARE_PROVIDER_SITE_OTHER): Payer: Self-pay | Admitting: *Deleted

## 2019-03-11 DIAGNOSIS — O0990 Supervision of high risk pregnancy, unspecified, unspecified trimester: Secondary | ICD-10-CM

## 2019-03-11 DIAGNOSIS — Z712 Person consulting for explanation of examination or test findings: Secondary | ICD-10-CM

## 2019-03-11 DIAGNOSIS — O24119 Pre-existing diabetes mellitus, type 2, in pregnancy, unspecified trimester: Secondary | ICD-10-CM

## 2019-03-11 DIAGNOSIS — O09522 Supervision of elderly multigravida, second trimester: Secondary | ICD-10-CM

## 2019-03-11 DIAGNOSIS — Z98891 History of uterine scar from previous surgery: Secondary | ICD-10-CM

## 2019-03-11 DIAGNOSIS — Z8632 Personal history of gestational diabetes: Secondary | ICD-10-CM | POA: Insufficient documentation

## 2019-03-11 DIAGNOSIS — O09529 Supervision of elderly multigravida, unspecified trimester: Secondary | ICD-10-CM | POA: Insufficient documentation

## 2019-03-11 DIAGNOSIS — O09299 Supervision of pregnancy with other poor reproductive or obstetric history, unspecified trimester: Secondary | ICD-10-CM | POA: Insufficient documentation

## 2019-03-11 NOTE — Progress Notes (Signed)
I connected with  Morgan Ramsey on 03/11/19 at  1:15 PM EDT by telephone and verified that I am speaking with the correct person using two identifiers.   I discussed the limitations, risks, security and privacy concerns of performing an evaluation and management service by telephone and the availability of in person appointments. I also discussed with the patient that there may be a patient responsible charge related to this service. The patient expressed understanding and agreed to proceed.  Interpreter [employer] used for encounter. New Ob intake completed. Pt advised that her prenatal care will include a combination of virtual as well as face to face visits. Covid visitor restrictions explained. Pt reports that she had GDM during last pregnancy in 2017. Per chart review, her PP 2hr GTT was slightly abnormal with fasting blood sugar - 100, 2hr - 91. Pt was advised to see PCP if she developed sx of diabetes. She recently had ED visit in November 2019 due to back and chest pain and was found to have elevated blood sugar (339). She was prescribed Metformin and told she had Diabetes but says she did not obtain the medication because "pills are bad for your kidneys". Referral to Diabetes management ordered. Pt had last pap in 2017 and was advised this test will be performed @ office visit. Pt has Hx of Herpes -last outbreak was prior to 2017. Pt agreed to check BP weekly and will be given a BP cuff @ office visit on 8/5. Pt voiced understanding of all information and instructions given.   Day, Ronnell Freshwater, RN 03/11/2019  2:13 PM

## 2019-03-12 ENCOUNTER — Telehealth: Payer: Self-pay | Admitting: *Deleted

## 2019-03-12 NOTE — Telephone Encounter (Signed)
Called pt w/Morgan Ramsey - interpreter. Pt was advised of need for office appointment in order to receive instruction for self blood glucose testing. Pt voiced understanding and agreed to appointment on 7/30 @ 1015.

## 2019-03-13 ENCOUNTER — Other Ambulatory Visit: Payer: Self-pay

## 2019-03-13 ENCOUNTER — Ambulatory Visit (INDEPENDENT_AMBULATORY_CARE_PROVIDER_SITE_OTHER): Payer: Self-pay | Admitting: Lactation Services

## 2019-03-13 VITALS — BP 125/71 | HR 90 | Wt 208.8 lb

## 2019-03-13 DIAGNOSIS — O24119 Pre-existing diabetes mellitus, type 2, in pregnancy, unspecified trimester: Secondary | ICD-10-CM

## 2019-03-13 DIAGNOSIS — O99210 Obesity complicating pregnancy, unspecified trimester: Secondary | ICD-10-CM

## 2019-03-13 DIAGNOSIS — O09522 Supervision of elderly multigravida, second trimester: Secondary | ICD-10-CM

## 2019-03-13 LAB — HEMOGLOBIN A1C
Est. average glucose Bld gHb Est-mCnc: 192 mg/dL
Hgb A1c MFr Bld: 8.3 % — ABNORMAL HIGH (ref 4.8–5.6)

## 2019-03-13 MED ORDER — ASPIRIN 81 MG PO CHEW: 81.0000 mg | CHEWABLE_TABLET | Freq: Once | ORAL | Status: DC

## 2019-03-13 NOTE — Progress Notes (Signed)
Patient was seen on 03/13/2019 for Impaired Glucose Tolerance with previous GDM with last pregnancy in 2026-2017. EDD 08/31/2019. Patient states she was told in Nov. 2019 that she has diabetes and was prescribed Metformin. She reports she did not take it.    Spoke with Pt with assistance of [place] interpreter Verdis Frederickson # [phone] then when connection failed we spoke with assistance of [employer], [phone]   The following learning objectives were met by the patient :   States the definition of Impaired Glucose Tolerance.  States when to check blood glucose levels  Demonstrates proper blood glucose monitoring techniques  States the effect of stress and exercise on blood glucose levels  Plan:   Consider  increasing your activity level by walking or other activity daily as tolerated  Begin checking BG before breakfast and 2 hours after first bite of breakfast, lunch and dinner as directed by MD   Bring Log Book/Sheet and meter to every medical appointment OR use Baby Scripts (see below)  Patient states she prefers to record BG on Log Sheet  Patient not appropriate for [employer] due to [place] language barrier.  Take medication as directed by MD  Blood glucose monitor given: [employer] Lot # [id number] Blood glucose reading: 231 mg/dl   Patient instructed to test pre breakfast and 2 hours each meal as directed by MD  Patient instructed to monitor glucose levels: FBS: 60 - 95 mg/dl 2 hour: <120 mg/dl  Patient received the following handouts:  Nutrition Diabetes and Pregnancy  Carbohydrate Counting List  Pts fasting blood glucose was 231, was reported to Dr. Hulan Fray. Dr. Hulan Fray ordered Prenatal labs and [facility]. Urine collected also. Panorama/Horizon obtained. Pt would not like to be told the sex, she would like it placed in an Envelope for Gender Reveal. She was given card to look on [place] website if wanted.   Patient will be seen for follow-up on August 5 by  Dr. Hulan Fray. She is to follow up with Diabetes Education for nutrition teaching.   Pt reports dizziness with getting out of bed and feels like she may pass out. She reports a history of headaches back in April for 2 weeks. She denies blurred vision. Pt reports she has not had difficulty with BP in the past but was told when in for Pregnancy test that her BP was too high at that time. Pt was given a BP cuff and was shown how to use to check her blood pressure. Advised pt to write her BP down weekly to share with providers when they call day.   Pt is planning to apply for Pregnancy Medicaid she was told to wait to apply for pregnancy Medicaid.   Pt to call back with questions/concerns as needed. Pt aware to return on Aug 5 for follow up.

## 2019-03-14 ENCOUNTER — Encounter: Payer: Self-pay | Admitting: *Deleted

## 2019-03-14 LAB — COMPREHENSIVE METABOLIC PANEL
ALT: 7 IU/L (ref 0–32)
AST: 12 IU/L (ref 0–40)
Albumin/Globulin Ratio: 1.1 — ABNORMAL LOW (ref 1.2–2.2)
Albumin: 3.6 g/dL — ABNORMAL LOW (ref 3.8–4.8)
Alkaline Phosphatase: 92 IU/L (ref 39–117)
BUN/Creatinine Ratio: 15 (ref 9–23)
BUN: 8 mg/dL (ref 6–24)
Bilirubin Total: <0.2 mg/dL (ref 0.0–1.2)
CO2: 20 mmol/L (ref 20–29)
Calcium: 8.8 mg/dL (ref 8.7–10.2)
Chloride: 100 mmol/L (ref 96–106)
Creatinine, Ser: 0.55 mg/dL — ABNORMAL LOW (ref 0.57–1.00)
GFR calc Af Amer: 132 mL/min/{1.73_m2} (ref >59)
GFR calc non Af Amer: 114 mL/min/{1.73_m2} (ref >59)
Globulin, Total: 3.2 g/dL (ref 1.5–4.5)
Glucose: 161 mg/dL — ABNORMAL HIGH (ref 65–99)
Potassium: 4 mmol/L (ref 3.5–5.2)
Sodium: 136 mmol/L (ref 134–144)
Total Protein: 6.8 g/dL (ref 6.0–8.5)

## 2019-03-14 LAB — OBSTETRIC PANEL, INCLUDING HIV
Antibody Screen: NEGATIVE
Basophils Absolute: 0 10*3/uL (ref 0.0–0.2)
Basos: 0 %
EOS (ABSOLUTE): 0.1 10*3/uL (ref 0.0–0.4)
Eos: 1 %
HIV Screen 4th Generation wRfx: NONREACTIVE
Hematocrit: 32.9 % — ABNORMAL LOW (ref 34.0–46.6)
Hemoglobin: 10.4 g/dL — ABNORMAL LOW (ref 11.1–15.9)
Hepatitis B Surface Ag: NEGATIVE
Immature Grans (Abs): 0.1 10*3/uL (ref 0.0–0.1)
Immature Granulocytes: 1 %
Lymphocytes Absolute: 1.3 10*3/uL (ref 0.7–3.1)
Lymphs: 13 %
MCH: 22.2 pg — ABNORMAL LOW (ref 26.6–33.0)
MCHC: 31.6 g/dL (ref 31.5–35.7)
MCV: 70 fL — ABNORMAL LOW (ref 79–97)
Monocytes Absolute: 0.6 10*3/uL (ref 0.1–0.9)
Monocytes: 6 %
Neutrophils Absolute: 7.8 10*3/uL — ABNORMAL HIGH (ref 1.4–7.0)
Neutrophils: 79 %
Platelets: 271 10*3/uL (ref 150–450)
RBC: 4.69 x10E6/uL (ref 3.77–5.28)
RDW: 16.7 % — ABNORMAL HIGH (ref 11.7–15.4)
RPR Ser Ql: NONREACTIVE
Rh Factor: POSITIVE
Rubella Antibodies, IGG: 20.2 index (ref >0.99)
WBC: 9.8 10*3/uL (ref 3.4–10.8)

## 2019-03-14 LAB — PROTEIN / CREATININE RATIO, URINE
Creatinine, Urine: 145.8 mg/dL
Protein, Ur: 26.2 mg/dL
Protein/Creat Ratio: 180 mg/g creat (ref 0–200)

## 2019-03-14 NOTE — Progress Notes (Signed)
I have reviewed this chart and agree with the RN/CMA assessment and management.    Alexy Heldt C Christos Mixson, MD, FACOG Attending Physician, [facility]  

## 2019-03-15 LAB — URINE CULTURE, OB REFLEX

## 2019-03-15 LAB — CULTURE, OB URINE

## 2019-03-18 ENCOUNTER — Encounter: Payer: Self-pay | Admitting: Obstetrics and Gynecology

## 2019-03-19 ENCOUNTER — Encounter: Payer: Self-pay | Admitting: Obstetrics & Gynecology

## 2019-03-24 ENCOUNTER — Encounter: Payer: Self-pay | Admitting: *Deleted

## 2019-03-25 ENCOUNTER — Telehealth: Payer: Self-pay

## 2019-03-25 NOTE — Telephone Encounter (Signed)
Pt called requesting results.  Called pt with Spanish Interpreter Eda R., and informed pt that her A1C was elevated however she has been dx with GDM.  Pt asks if she can know the gender of her baby because her daughter wants to know.  I explained to the pt that she will get those results at her appt and if she does not want to know as she has already stated then we are happy to put it in a envelope.  Pt verbalized understanding.    Mel Almond, RN 03/25/19

## 2019-04-02 ENCOUNTER — Other Ambulatory Visit: Payer: Self-pay

## 2019-04-02 ENCOUNTER — Telehealth: Payer: Self-pay | Admitting: *Deleted

## 2019-04-02 ENCOUNTER — Ambulatory Visit (INDEPENDENT_AMBULATORY_CARE_PROVIDER_SITE_OTHER): Payer: Self-pay | Admitting: Obstetrics & Gynecology

## 2019-04-02 VITALS — BP 129/79 | HR 92 | Temp 98.6°F | Wt 206.9 lb

## 2019-04-02 DIAGNOSIS — O26892 Other specified pregnancy related conditions, second trimester: Secondary | ICD-10-CM

## 2019-04-02 DIAGNOSIS — O99210 Obesity complicating pregnancy, unspecified trimester: Secondary | ICD-10-CM

## 2019-04-02 DIAGNOSIS — O0990 Supervision of high risk pregnancy, unspecified, unspecified trimester: Secondary | ICD-10-CM

## 2019-04-02 DIAGNOSIS — O24119 Pre-existing diabetes mellitus, type 2, in pregnancy, unspecified trimester: Secondary | ICD-10-CM

## 2019-04-02 DIAGNOSIS — N898 Other specified noninflammatory disorders of vagina: Secondary | ICD-10-CM

## 2019-04-02 DIAGNOSIS — O24112 Pre-existing diabetes mellitus, type 2, in pregnancy, second trimester: Secondary | ICD-10-CM

## 2019-04-02 DIAGNOSIS — B373 Candidiasis of vulva and vagina: Secondary | ICD-10-CM

## 2019-04-02 DIAGNOSIS — Z1151 Encounter for screening for human papillomavirus (HPV): Secondary | ICD-10-CM

## 2019-04-02 DIAGNOSIS — Z124 Encounter for screening for malignant neoplasm of cervix: Secondary | ICD-10-CM

## 2019-04-02 DIAGNOSIS — Z113 Encounter for screening for infections with a predominantly sexual mode of transmission: Secondary | ICD-10-CM

## 2019-04-02 DIAGNOSIS — N760 Acute vaginitis: Secondary | ICD-10-CM

## 2019-04-02 DIAGNOSIS — B9689 Other specified bacterial agents as the cause of diseases classified elsewhere: Secondary | ICD-10-CM

## 2019-04-02 DIAGNOSIS — Z3A18 18 weeks gestation of pregnancy: Secondary | ICD-10-CM

## 2019-04-02 DIAGNOSIS — Z3009 Encounter for other general counseling and advice on contraception: Secondary | ICD-10-CM | POA: Insufficient documentation

## 2019-04-02 DIAGNOSIS — Z98891 History of uterine scar from previous surgery: Secondary | ICD-10-CM

## 2019-04-02 DIAGNOSIS — O99212 Obesity complicating pregnancy, second trimester: Secondary | ICD-10-CM

## 2019-04-02 DIAGNOSIS — O0992 Supervision of high risk pregnancy, unspecified, second trimester: Secondary | ICD-10-CM

## 2019-04-02 MED ORDER — METFORMIN HCL 500 MG PO TABS: ORAL_TABLET | ORAL | 4 refills | Status: DC

## 2019-04-02 MED ORDER — METRONIDAZOLE 500 MG PO TABS: 500.0000 mg | ORAL_TABLET | Freq: Two times a day (BID) | ORAL | 0 refills | Status: DC

## 2019-04-02 NOTE — Progress Notes (Signed)
Scheduled u/s at [facility] 9/3 @ 10am

## 2019-04-02 NOTE — Progress Notes (Signed)
  Subjective:    Morgan Ramsey is a married G5P3  being seen today for her first obstetrical visit.  This is not a planned pregnancy. She is at [redacted]w[redacted]d gestation. Her obstetrical history is significant for advanced maternal age, obesity and GDM, previous cesarean followed by VBAC, and unwanted fertility.. Relationship with FOB: spouse, living together. Patient does intend to breast feed. Pregnancy history fully reviewed.  Patient reports no complaints.  Review of Systems:   Review of Systems  She is a homemaker.  Objective:     BP 129/79   Pulse 92   Temp 98.6 F (37 C)   Wt 206 lb 14.4 oz (93.8 kg)   LMP 11/24/2018 (Exact Date)   BMI 33.39 kg/m  Physical Exam  Exam  .mcb Breathing, conversing, and ambulating normally Well nourished, well hydrated Latina, no apparent distress  Live interpretor used for the visit Heart- rrr Lungs- CTAB Abd- benign, obese, gravid FH at umbilicus minus 2 Cervix normal Vaginal discharge c/w BV    Assessment:    Pregnancy: [id number] Patient Active Problem List   Diagnosis Date Noted  . Unwanted fertility 04/02/2019  . Supervision of high risk pregnancy, antepartum 03/11/2019  . History of VBAC 03/11/2019  . Advanced maternal age in multigravida 03/11/2019  . Type 2 diabetes mellitus during pregnancy, antepartum 03/11/2019  . History of gestational diabetes in prior pregnancy, currently pregnant 03/11/2019  . Language barrier 04/26/2015  . H/O herpes simplex type 2 infection 04/26/2015  . Previous cesarean section 04/26/2015  . Obesity 04/26/2015  . Anemia 04/26/2015       Plan:     Initial labs drawn. Prenatal vitamins. Problem list reviewed and updated. Role of ultrasound in pregnancy discussed; fetal survey: ordered. Amniocentesis discussed: declined. Rec baby asa daily Rec metformin 500 mg with breakfast and at bedtime as her sugars were in the 190s throughout the day. Rec less than 20 pound weight gain with the  pregnancy. She will be seeing the nutritionist tomorrow.  rec OTC iron daily  Check pr/cr Pap smear today Flagyl prescribed, discussed risks of PTL Wet prep sent   Emily Filbert 04/02/2019

## 2019-04-02 NOTE — Telephone Encounter (Signed)
Received a voicemail in [place]. Asked Eda Royal , Interpreter to translate. Message states she needs to get more needles to check her blood sugar.  Per chart was seen today . I called [employer] with Eda and we clarified she needed lancets , is self pay. We informed her she can get them during office hours today or tomorrow and that we close tomorrow at 12. She voices understanding. Linda,RN

## 2019-04-03 ENCOUNTER — Telehealth: Payer: Self-pay | Admitting: Obstetrics and Gynecology

## 2019-04-03 ENCOUNTER — Ambulatory Visit: Payer: Self-pay | Admitting: Skilled Nursing Facility1

## 2019-04-03 LAB — PROTEIN / CREATININE RATIO, URINE
Creatinine, Urine: 220.7 mg/dL
Protein, Ur: 38.4 mg/dL
Protein/Creat Ratio: 174 mg/g creat (ref 0–200)

## 2019-04-03 LAB — CERVICOVAGINAL ANCILLARY ONLY
Bacterial vaginitis: POSITIVE — AB
Candida vaginitis: POSITIVE — AB
Trichomonas: NEGATIVE

## 2019-04-03 NOTE — Telephone Encounter (Signed)
The patient came into the office to pick up lances, strips, and gender of the baby. Provided by More[employer] Humming[employer].

## 2019-04-04 ENCOUNTER — Other Ambulatory Visit: Payer: Self-pay | Admitting: Obstetrics & Gynecology

## 2019-04-04 ENCOUNTER — Encounter: Payer: Self-pay | Admitting: General Practice

## 2019-04-04 ENCOUNTER — Telehealth: Payer: Self-pay

## 2019-04-04 ENCOUNTER — Other Ambulatory Visit: Payer: Self-pay

## 2019-04-04 DIAGNOSIS — B3731 Acute candidiasis of vulva and vagina: Secondary | ICD-10-CM

## 2019-04-04 DIAGNOSIS — B373 Candidiasis of vulva and vagina: Secondary | ICD-10-CM

## 2019-04-04 MED ORDER — FLUCONAZOLE 150 MG PO TABS: 150.0000 mg | ORAL_TABLET | Freq: Once | ORAL | 3 refills | Status: AC

## 2019-04-04 MED ORDER — TERCONAZOLE 0.4 % VA CREA: 1.0000 | TOPICAL_CREAM | Freq: Every day | VAGINAL | 0 refills | Status: DC

## 2019-04-04 NOTE — Telephone Encounter (Signed)
-----   Message from Emily Filbert, MD sent at 04/04/2019  8:15 AM EDT ----- Please let her know that her wet prep also showed yeast and I sent in a prescription for diflucan to treat that. Thanks

## 2019-04-04 NOTE — Progress Notes (Signed)
Diflucan ordered for + wet prep

## 2019-04-04 NOTE — Telephone Encounter (Signed)
Called Pt to advise that she tested positive for Yeast and that a Rx was sent to her pharmacy,used Morgan Ramsey id# [phone] from [facility] Interpreters/ Pt verbalized understanding.

## 2019-04-07 LAB — CYTOLOGY - PAP
Diagnosis: NEGATIVE
HPV 16/18/45 genotyping: POSITIVE — AB
HPV: DETECTED — AB

## 2019-04-08 ENCOUNTER — Telehealth (INDEPENDENT_AMBULATORY_CARE_PROVIDER_SITE_OTHER): Payer: Self-pay | Admitting: Lactation Services

## 2019-04-08 DIAGNOSIS — B3731 Acute candidiasis of vulva and vagina: Secondary | ICD-10-CM

## 2019-04-08 DIAGNOSIS — N760 Acute vaginitis: Secondary | ICD-10-CM

## 2019-04-08 DIAGNOSIS — B373 Candidiasis of vulva and vagina: Secondary | ICD-10-CM

## 2019-04-08 DIAGNOSIS — B9689 Other specified bacterial agents as the cause of diseases classified elsewhere: Secondary | ICD-10-CM

## 2019-04-08 MED ORDER — TERCONAZOLE 0.4 % VA CREA: 1.0000 | TOPICAL_CREAM | Freq: Every day | VAGINAL | 0 refills | Status: DC

## 2019-04-08 MED FILL — TERCONAZOLE 0.4% CREAM: 0.4 | 3 days supply | Qty: 45 | Fill #0

## 2019-04-08 NOTE — Telephone Encounter (Signed)
Pt called and LM on nurse line that was interpreted by [employer], Spanish Interpreter.   Pt reports she cannot afford the medication that was sent in and [facility] to [facility]   Attempted to call pt at 3:55 pm with assistance of [employer], Administrator, sports. Pt did not answer the phone. Eda then called the husband and he was to call the pt ask her to pick up the phone.   We then called the pt back and she answered the phone. Her concern was the second medication that was sent in was too expensive (Terconazole) at [facility]. Discussed with pt that she can try to take Monistat OTC instead if she cannot afford. Informed her that I would send the prescription would be sent to [place]

## 2019-04-08 NOTE — Progress Notes (Unsigned)
Dr. Hulan Fray sent staff message requesting that pt gets scheduled for a colpo.  Message sent to Dr. Hulan Fray and Gabriel Cirri, [facility]CP that pt will get schedule with [facility]CP.    Mel Almond, RN 04/08/19

## 2019-04-10 ENCOUNTER — Telehealth: Payer: Self-pay | Admitting: *Deleted

## 2019-04-10 NOTE — Telephone Encounter (Signed)
I called Kasi again with [facility] [phone] and left a message I am calling with results to discuss with you; please call our office. Will send letter to patient. Will place note on next ob appointment 04/23/19 to review with her.  Linda,RN

## 2019-04-10 NOTE — Telephone Encounter (Signed)
-----   Message from Emily Filbert, MD sent at 04/09/2019  7:01 PM EDT ----- She needs a genetics consult since her SMA was increased risk. Thanks

## 2019-04-10 NOTE — Telephone Encounter (Signed)
Morgan Ramsey with [facility] [phone] and left message we are calling with non- urgent results- please call our office .  When she returns call need to offer free genetic counseling thru [employer]- she must call [phone] herself to schedule Linda,RN

## 2019-04-14 ENCOUNTER — Encounter: Payer: Self-pay | Admitting: Obstetrics & Gynecology

## 2019-04-14 DIAGNOSIS — O285 Abnormal chromosomal and genetic finding on antenatal screening of mother: Secondary | ICD-10-CM | POA: Insufficient documentation

## 2019-04-22 ENCOUNTER — Telehealth: Payer: Self-pay | Admitting: Student

## 2019-04-22 NOTE — Telephone Encounter (Signed)
Called the patient and left a detailed voicemail of the upcoming appointment. Informed the patient if she has been diagnosed with [id number], in close contact with someone who has had covid19, or experienced any flu-like symptoms such as fever, rash, sore throat, or shortness of breath please call our office to reschedule the appointment. If you have any questions or concerns please give our office a call at [phone].

## 2019-04-23 ENCOUNTER — Ambulatory Visit (INDEPENDENT_AMBULATORY_CARE_PROVIDER_SITE_OTHER): Payer: Self-pay | Admitting: Family Medicine

## 2019-04-23 ENCOUNTER — Other Ambulatory Visit: Payer: Self-pay

## 2019-04-23 VITALS — BP 141/84 | HR 102 | Wt 208.8 lb

## 2019-04-23 DIAGNOSIS — Z789 Other specified health status: Secondary | ICD-10-CM

## 2019-04-23 DIAGNOSIS — O0992 Supervision of high risk pregnancy, unspecified, second trimester: Secondary | ICD-10-CM

## 2019-04-23 DIAGNOSIS — Z98891 History of uterine scar from previous surgery: Secondary | ICD-10-CM

## 2019-04-23 DIAGNOSIS — O24119 Pre-existing diabetes mellitus, type 2, in pregnancy, unspecified trimester: Secondary | ICD-10-CM

## 2019-04-23 DIAGNOSIS — O24112 Pre-existing diabetes mellitus, type 2, in pregnancy, second trimester: Secondary | ICD-10-CM

## 2019-04-23 DIAGNOSIS — O0990 Supervision of high risk pregnancy, unspecified, unspecified trimester: Secondary | ICD-10-CM

## 2019-04-23 DIAGNOSIS — O09522 Supervision of elderly multigravida, second trimester: Secondary | ICD-10-CM

## 2019-04-23 DIAGNOSIS — O34219 Maternal care for unspecified type scar from previous cesarean delivery: Secondary | ICD-10-CM

## 2019-04-23 DIAGNOSIS — Z3A21 21 weeks gestation of pregnancy: Secondary | ICD-10-CM

## 2019-04-23 DIAGNOSIS — Z8619 Personal history of other infectious and parasitic diseases: Secondary | ICD-10-CM

## 2019-04-23 DIAGNOSIS — R87810 Cervical high risk human papillomavirus (HPV) DNA test positive: Secondary | ICD-10-CM

## 2019-04-23 MED ORDER — METFORMIN HCL 500 MG PO TABS: 500.0000 mg | ORAL_TABLET | Freq: Two times a day (BID) | ORAL | 3 refills | Status: DC

## 2019-04-23 NOTE — Progress Notes (Signed)
Subjective:  Morgan Ramsey is a 43 y.o. [id number] at [redacted]w[redacted]d being seen today for ongoing prenatal care.  She is currently monitored for the following issues for this high-risk pregnancy and has Language barrier; H/O herpes simplex type 2 infection; Previous cesarean section; Obesity; Anemia; Supervision of high risk pregnancy, antepartum; History of VBAC; Advanced maternal age in multigravida; Type 2 diabetes mellitus during pregnancy, antepartum; History of gestational diabetes in prior pregnancy, currently pregnant; Unwanted fertility; and Abnormal genetic test during pregnancy on their problem list.  GDM: Patient taking metformin 500mg  BID.  Reports no hypoglycemic episodes.  Tolerating medication well Fasting: 111-142 2hr PP: 140-160  Patient reports no complaints.  Contractions: Not present. Vag. Bleeding: None.  Movement: Present. Denies leaking of fluid.   The following portions of the patient's history were reviewed and updated as appropriate: allergies, current medications, past family history, past medical history, past social history, past surgical history and problem list. Problem list updated.  Objective:   Vitals:   04/23/19 1012  BP: (!) 141/84  Pulse: (!) 102  Weight: 208 lb 12.8 oz (94.7 kg)    Fetal Status: Fetal Heart Rate (bpm): 140   Movement: Present     General:  Alert, oriented and cooperative. Patient is in no acute distress.  Skin: Skin is warm and dry. No rash noted.   Cardiovascular: Normal heart rate noted  Respiratory: Normal respiratory effort, no problems with respiration noted  Abdomen: Soft, gravid, appropriate for gestational age. Pain/Pressure: Present     Pelvic: Vag. Bleeding: None     Cervical exam deferred        Extremities: Normal range of motion.  Edema: None  Mental Status: Normal mood and affect. Normal behavior. Normal judgment and thought content.   Urinalysis:      Assessment and Plan:  Pregnancy: [id number] at [redacted]w[redacted]d  1.  Supervision of high risk pregnancy, antepartum FHT and FH normal  2. Previous cesarean section H/o VBAC  3. H/O herpes simplex type 2 infection PPX at 35 weeks  4. Multigravida of advanced maternal age in second trimester  5. Type 2 diabetes mellitus during pregnancy, antepartum Serial [place] for growth Fetal echo scheduled Increase metformin at dinner to 1000mg   Continue metformin 500mg  with breakfast F/u in 2 weeks.  6. Language barrier Interpreter used  7. History of VBAC   Preterm labor symptoms and general obstetric precautions including but not limited to vaginal bleeding, contractions, leaking of fluid and fetal movement were reviewed in detail with the patient. Please refer to After Visit Summary for other counseling recommendations.  Return in about 2 weeks (around 05/07/2019) for HR OB f/u, In Office.   Truett Mainland, DO

## 2019-04-23 NOTE — Progress Notes (Signed)
Terazol cream was expensive didn't get it.

## 2019-04-23 NOTE — Progress Notes (Signed)
Patient Name: Morgan Ramsey, female   DOB: 06-May-1976, 43 y.o.  MRN: [id number]  Colposcopy Procedure Note:  [id number] Pregnancy status: [redacted]w[redacted]d Indications: Normal Cytology, + HR HPV HPV:  16 Cervical History:  Previous Abnormal Pap: Normal  Previous Colposcopy: n/a  Previous LEEP or Cryo: n/a  Smoking: Never Smoked Hysterectomy: No   Patient given informed consent, signed copy in the chart, time out was performed.    Exam: Vulva and Vagina grossly normal.  Cervix viewed with speculum and colposcope after application of acetic acid:  Cervix Fully Visualized Squamocolumnar Junction Visibility: Fully visualized  Acetowhite lesions: n/a  Other Lesions: None Punctation: Not present  Mosaicism: Not present Abnormal vasculature: No   Biopsies: n/a ECC: No   Colposcopy Impression:  Benign   Patient was given post procedure instructions.  Will call patient with results.

## 2019-04-24 ENCOUNTER — Encounter: Payer: Self-pay | Attending: Obstetrics & Gynecology | Admitting: Skilled Nursing Facility1

## 2019-04-24 ENCOUNTER — Encounter: Payer: Self-pay | Admitting: *Deleted

## 2019-04-24 ENCOUNTER — Encounter: Payer: Self-pay | Admitting: Skilled Nursing Facility1

## 2019-04-24 DIAGNOSIS — O24119 Pre-existing diabetes mellitus, type 2, in pregnancy, unspecified trimester: Secondary | ICD-10-CM | POA: Insufficient documentation

## 2019-04-24 NOTE — Progress Notes (Signed)
Pt state she started the metformin last month. Pt states she is about  Pt states she is taking a prenatal. Interpretor: Morgan Ramsey from [facility] Pt states she is about [redacted] weeks along.  Pt state she does take her ion supplement every day. Pt states she does have a hx of GDM.  Pt states her husband makes sure she follows the recommendations.  Pt states she has had DM type 2 since about December 2019. Pt states her mother has diabetes which is uncontrolled and is an amputee.  Pt state she checks her blood sugar 4 times a day: fasting: 123 and 2 hours post prandial: 130, 135 Pt states if she skips breakfast she gets shaky hands.  Pt states her husband is very strict with her: Dietitian advised she have him read through all of the materials given and give [place] a call if he has any questions.  Diabetes Self-Management Education  Visit Type: First/Initial 04/24/2019  Morgan Ramsey, identified by name and date of birth, is a 43 y.o. female with a diagnosis of Diabetes: Type 2.   ASSESSMENT  Height 5\' 5"  (1.651 m), weight 209 lb 6.4 oz (95 kg), last menstrual period 11/24/2018, unknown if currently breastfeeding. Body mass index is 34.85 kg/m.  Diabetes Self-Management Education - 04/24/19 1015      Visit Information   Visit Type  First/Initial      Initial Visit   Diabetes Type  Type 2    Are you currently following a meal plan?  No    Are you taking your medications as prescribed?  Yes      Health Coping   How would you rate your overall health?  Good      Psychosocial Assessment   Patient Belief/Attitude about Diabetes  Motivated to manage diabetes    Self-care barriers  English as a second language    Self-management support  Family    Other persons present  Interpreter      Pre-Education Assessment   Patient understands the diabetes disease and treatment process.  Needs Instruction    Patient understands incorporating nutritional management into lifestyle.  Needs Instruction     Patient undertands incorporating physical activity into lifestyle.  Needs Instruction    Patient understands using medications safely.  Needs Instruction    Patient understands monitoring blood glucose, interpreting and using results  Needs Instruction    Patient understands prevention, detection, and treatment of acute complications.  Needs Instruction    Patient understands prevention, detection, and treatment of chronic complications.  Needs Instruction    Patient understands how to develop strategies to address psychosocial issues.  Needs Instruction    Patient understands how to develop strategies to promote health/change behavior.  Needs Instruction      Complications   Last HgB A1C per patient/outside source  8.3 %    How often do you check your blood sugar?  > 4 times/day    Fasting Blood glucose range (mg/dL)  70-129    Postprandial Blood glucose range (mg/dL)  130-179    Number of hypoglycemic episodes per month  0    Number of hyperglycemic episodes per week  0    Have you had a dilated eye exam in the past 12 months?  No    Have you had a dental exam in the past 12 months?  No    Are you checking your feet?  No      Dietary Intake   Breakfast  1 bread  and 1 egg    Snack (morning)  fruit or nuts    Lunch  broccoil and caulfower rice salmon    Snack (afternoon)  1-2 corn tortilla with chicken adn lettuce    Dinner  tortilla chicken and vegtables someimtes rice or beans    Snack (evening)  cold cereal    Beverage(s)  juice, milk, water      Exercise   Exercise Type  Light (walking / raking leaves)    How many days per week to you exercise?  3    How many minutes per day do you exercise?  20    Total minutes per week of exercise  60      Patient Education   Previous Diabetes Education  No    Disease state   Factors that contribute to the development of diabetes    Acute complications  Discussed and identified patients' treatment of hyperglycemia.;Taught treatment of  hypoglycemia - the 15 rule.      Individualized Goals (developed by patient)   Nutrition  Follow meal plan discussed;General guidelines for healthy choices and portions discussed;Adjust meds/carbs with exercise as discussed    Physical Activity  Exercise 5-7 days per week;30 minutes per day    Monitoring   test blood glucose pre and post meals as discussed      Post-Education Assessment   Patient understands the diabetes disease and treatment process.  Demonstrates understanding / competency    Patient understands incorporating nutritional management into lifestyle.  Demonstrates understanding / competency    Patient undertands incorporating physical activity into lifestyle.  Demonstrates understanding / competency    Patient understands using medications safely.  Demonstrates understanding / competency    Patient understands monitoring blood glucose, interpreting and using results  Demonstrates understanding / competency    Patient understands prevention, detection, and treatment of acute complications.  Demonstrates understanding / competency    Patient understands prevention, detection, and treatment of chronic complications.  Demonstrates understanding / competency    Patient understands how to develop strategies to address psychosocial issues.  Demonstrates understanding / competency    Patient understands how to develop strategies to promote health/change behavior.  Demonstrates understanding / competency      Outcomes   Expected Outcomes  Demonstrated interest in learning. Expect positive outcomes    Future DMSE  PRN    Program Status  Completed       Individualized Plan for Diabetes Self-Management Training:   Learning Objective:  Patient will have a greater understanding of diabetes self-management. Patient education plan is to attend individual and/or group sessions per assessed needs and concerns.   Plan:   There are no Patient Instructions on file for this  visit.  Expected Outcomes:  Demonstrated interest in learning. Expect positive outcomes  Education material provided: ADA - How to Thrive: A Guide for Your Journey with Diabetes, My Plate and Snack sheet  If problems or questions, patient to contact team via:  Phone  Future DSME appointment: PRN

## 2019-04-25 ENCOUNTER — Encounter: Payer: Self-pay | Admitting: *Deleted

## 2019-05-02 ENCOUNTER — Ambulatory Visit: Payer: Self-pay | Admitting: Obstetrics and Gynecology

## 2019-05-06 ENCOUNTER — Telehealth: Payer: Self-pay | Admitting: Obstetrics and Gynecology

## 2019-05-06 NOTE — Telephone Encounter (Signed)
Spanish interpreter Morgan Ramsey called patient about her appointment on 9/23 @ 8:15. Patient instructed to wear a face mask for the entire appointment and no visitors are allowed. Patient screened for covid symptoms and denied having any.

## 2019-05-07 ENCOUNTER — Ambulatory Visit (INDEPENDENT_AMBULATORY_CARE_PROVIDER_SITE_OTHER): Payer: Self-pay | Admitting: Obstetrics and Gynecology

## 2019-05-07 ENCOUNTER — Encounter: Payer: Self-pay | Admitting: Obstetrics and Gynecology

## 2019-05-07 ENCOUNTER — Other Ambulatory Visit: Payer: Self-pay

## 2019-05-07 VITALS — BP 117/61 | HR 89 | Temp 98.0°F | Wt 211.8 lb

## 2019-05-07 DIAGNOSIS — E66811 Obesity, class 1: Secondary | ICD-10-CM

## 2019-05-07 DIAGNOSIS — O99212 Obesity complicating pregnancy, second trimester: Secondary | ICD-10-CM

## 2019-05-07 DIAGNOSIS — Z3A23 23 weeks gestation of pregnancy: Secondary | ICD-10-CM

## 2019-05-07 DIAGNOSIS — O139 Gestational [pregnancy-induced] hypertension without significant proteinuria, unspecified trimester: Secondary | ICD-10-CM | POA: Insufficient documentation

## 2019-05-07 DIAGNOSIS — Z98891 History of uterine scar from previous surgery: Secondary | ICD-10-CM

## 2019-05-07 DIAGNOSIS — O24112 Pre-existing diabetes mellitus, type 2, in pregnancy, second trimester: Secondary | ICD-10-CM

## 2019-05-07 DIAGNOSIS — Z789 Other specified health status: Secondary | ICD-10-CM

## 2019-05-07 DIAGNOSIS — O24119 Pre-existing diabetes mellitus, type 2, in pregnancy, unspecified trimester: Secondary | ICD-10-CM

## 2019-05-07 DIAGNOSIS — O99210 Obesity complicating pregnancy, unspecified trimester: Secondary | ICD-10-CM

## 2019-05-07 DIAGNOSIS — Z603 Acculturation difficulty: Secondary | ICD-10-CM

## 2019-05-07 DIAGNOSIS — O0992 Supervision of high risk pregnancy, unspecified, second trimester: Secondary | ICD-10-CM

## 2019-05-07 DIAGNOSIS — Z8619 Personal history of other infectious and parasitic diseases: Secondary | ICD-10-CM

## 2019-05-07 DIAGNOSIS — D649 Anemia, unspecified: Secondary | ICD-10-CM

## 2019-05-07 DIAGNOSIS — E669 Obesity, unspecified: Secondary | ICD-10-CM

## 2019-05-07 DIAGNOSIS — O0990 Supervision of high risk pregnancy, unspecified, unspecified trimester: Secondary | ICD-10-CM

## 2019-05-07 DIAGNOSIS — O09522 Supervision of elderly multigravida, second trimester: Secondary | ICD-10-CM

## 2019-05-07 MED ORDER — METFORMIN HCL 500 MG PO TABS: 1000.0000 mg | ORAL_TABLET | Freq: Two times a day (BID) | ORAL | 3 refills | Status: DC

## 2019-05-07 NOTE — Progress Notes (Signed)
Pt writes Glucose readings on Paper log

## 2019-05-07 NOTE — Progress Notes (Signed)
Spoke with Morgan Ramsey @ [employer]. HD to get a copy of Pts [place] U/S.

## 2019-05-07 NOTE — Progress Notes (Signed)
Prenatal Visit Note Date: 05/07/2019 Clinic: [facility]  Subjective:  Morgan Ramsey is a 43 y.o. [phone] at 110w3d being seen today for ongoing prenatal care.  She is currently monitored for the following issues for this high-risk pregnancy and has Language barrier; H/O herpes simplex type 2 infection; Previous cesarean section; Obesity in pregnancy; Anemia; Supervision of high risk pregnancy, antepartum; History of VBAC; Advanced maternal age in multigravida; Type 2 diabetes mellitus during pregnancy, antepartum; Unwanted fertility; Abnormal genetic test during pregnancy; Obesity (BMI 30.0-34.9); and Transient hypertension of pregnancy on their problem list.  Patient reports no complaints.   Contractions: Not present. Vag. Bleeding: None.  Movement: Present. Denies leaking of fluid.   The following portions of the patient's history were reviewed and updated as appropriate: allergies, current medications, past family history, past medical history, past social history, past surgical history and problem list. Problem list updated.  Objective:   Vitals:   05/07/19 0823  BP: 117/61  Pulse: 89  Temp: 98 F (36.7 C)  Weight: 211 lb 12.8 oz (96.1 kg)    Fetal Status: Fetal Heart Rate (bpm): 150   Movement: Present     General:  Alert, oriented and cooperative. Patient is in no acute distress.  Skin: Skin is warm and dry. No rash noted.   Cardiovascular: Normal heart rate noted  Respiratory: Normal respiratory effort, no problems with respiration noted  Abdomen: Soft, gravid, appropriate for gestational age. Pain/Pressure: Present     Pelvic:  Cervical exam deferred        Extremities: Normal range of motion.  Edema: None  Mental Status: Normal mood and affect. Normal behavior. Normal judgment and thought content.   Urinalysis:      Assessment and Plan:  Pregnancy: [id number] at [redacted]w[redacted]d  1. Type 2 diabetes mellitus during pregnancy, antepartum On metformin  500/100.  AM fastings 100s-110s 2h PP >25% elevated with elevations usually 120s-130s >25% elevated with elevations usually 130s-150s >25% elevated with elevations usually 120s D/w her that will have her see dm education in a week and likely start insulin at that time HD called and to fax over her 9/17 records and will have them schedule one qmonth thereafter Start ap testing at [place] at 39wks at the latest -s/p neg fetal echo 04/2019. No f/u needed  2. Supervision of high risk pregnancy, antepartum  3. History of VBAC Sign tolac consent later in pregnancy  4. Multigravida of advanced maternal age in second trimester F/u anatomy u/s -low risk nips  5. H/O herpes simplex type 2 infection Start ppx at 34-36wks  6. Obesity (BMI 30.0-34.9)  7. Anemia, unspecified type Confirms on qday iron  8. Obesity in pregnancy  9. Previous cesarean section  10. Language barrier Interpreter used  11. Transient HTN Normal today. ?cHTN based on prior ones. Continue to follow closely. No change in plan of care even if has cHTN at this time  Preterm labor symptoms and general obstetric precautions including but not limited to vaginal bleeding, contractions, leaking of fluid and fetal movement were reviewed in detail with the patient. Please refer to After Visit Summary for other counseling recommendations.  Return in about 1 week (around 05/14/2019) for in person, diabetes education visit to likely start insulin.   Aletha Halim, MD

## 2019-05-14 ENCOUNTER — Telehealth: Payer: Self-pay | Admitting: Family Medicine

## 2019-05-14 NOTE — Telephone Encounter (Signed)
Spanish interpreter Eda attempted to call patient about her appointment on 10/1 @ 10:15. No answer, Eda left a voicemail instructing patient to wear a face mask for the entire appointment and no visitors are allowed. Patient instructed not to attend the appointment if she has any symptoms. Symptom list and office number left.

## 2019-05-15 ENCOUNTER — Ambulatory Visit: Payer: Self-pay | Admitting: *Deleted

## 2019-05-15 ENCOUNTER — Other Ambulatory Visit: Payer: Self-pay

## 2019-05-15 ENCOUNTER — Encounter: Payer: Self-pay | Attending: Obstetrics & Gynecology | Admitting: *Deleted

## 2019-05-15 DIAGNOSIS — O24119 Pre-existing diabetes mellitus, type 2, in pregnancy, unspecified trimester: Secondary | ICD-10-CM

## 2019-05-15 MED ORDER — "INSULIN SYRINGE-NEEDLE U-100 30G X 5/16"" 0.5 ML MISC": 1.0000 | Freq: Two times a day (BID) | 6 refills | Status: DC

## 2019-05-15 MED ORDER — INSULIN NPH (HUMAN) (ISOPHANE) 100 UNIT/ML ~~LOC~~ SUSP: SUBCUTANEOUS | 6 refills | Status: DC

## 2019-05-15 NOTE — Progress Notes (Signed)
Insulin Instruction  Patient was seen on 05/15/2019 for insulin instruction. Patient speaks Spanish, we had live interpretor for this visit. Patient has history of type 2 diabetes since she had GDM with last pregnancy. She is currently taking Metformin. That dose was increased on 9/23 and BG are improved but still slightly elevated fasting as well as post meal. She is at 24 weeks 4 days with EDD of 08/31/2019 MD orders are: NPH 10 units in AM and 10 units at PM  The following learning objectives were met by the patient during this visit:   Insulin Action of NPH insulins  Reviewed syringe & vial including # units per syringe and vial  Hygiene and storage  Drawing up single and mixed doses if using vials   Single dose  Rotation of Sites  Hypoglycemia- symptoms, causes, treatment choices  Record keeping and MD follow up  Patient demonstrated understanding of insulin administration by return demonstration.  Patient received the following handouts:  Insulin Instruction Handout                                        Patient to start on insulin as Rx'd by MD  Patient will be seen for follow-up as needed.

## 2019-05-19 ENCOUNTER — Encounter: Payer: Self-pay | Admitting: *Deleted

## 2019-05-21 ENCOUNTER — Encounter: Payer: Self-pay | Admitting: Obstetrics and Gynecology

## 2019-05-22 ENCOUNTER — Ambulatory Visit (INDEPENDENT_AMBULATORY_CARE_PROVIDER_SITE_OTHER): Payer: Self-pay | Admitting: Obstetrics and Gynecology

## 2019-05-22 ENCOUNTER — Other Ambulatory Visit: Payer: Self-pay

## 2019-05-22 VITALS — BP 114/74 | HR 89 | Temp 98.6°F | Wt 210.5 lb

## 2019-05-22 DIAGNOSIS — O09522 Supervision of elderly multigravida, second trimester: Secondary | ICD-10-CM

## 2019-05-22 DIAGNOSIS — Z3A25 25 weeks gestation of pregnancy: Secondary | ICD-10-CM

## 2019-05-22 DIAGNOSIS — D649 Anemia, unspecified: Secondary | ICD-10-CM

## 2019-05-22 DIAGNOSIS — Z3009 Encounter for other general counseling and advice on contraception: Secondary | ICD-10-CM

## 2019-05-22 DIAGNOSIS — O0990 Supervision of high risk pregnancy, unspecified, unspecified trimester: Secondary | ICD-10-CM

## 2019-05-22 DIAGNOSIS — O99212 Obesity complicating pregnancy, second trimester: Secondary | ICD-10-CM

## 2019-05-22 DIAGNOSIS — O24119 Pre-existing diabetes mellitus, type 2, in pregnancy, unspecified trimester: Secondary | ICD-10-CM

## 2019-05-22 DIAGNOSIS — E669 Obesity, unspecified: Secondary | ICD-10-CM

## 2019-05-22 DIAGNOSIS — O132 Gestational [pregnancy-induced] hypertension without significant proteinuria, second trimester: Secondary | ICD-10-CM

## 2019-05-22 DIAGNOSIS — Z789 Other specified health status: Secondary | ICD-10-CM

## 2019-05-22 DIAGNOSIS — O0992 Supervision of high risk pregnancy, unspecified, second trimester: Secondary | ICD-10-CM

## 2019-05-22 DIAGNOSIS — Z8619 Personal history of other infectious and parasitic diseases: Secondary | ICD-10-CM

## 2019-05-22 DIAGNOSIS — Z98891 History of uterine scar from previous surgery: Secondary | ICD-10-CM

## 2019-05-22 NOTE — Progress Notes (Signed)
Prenatal Visit Note Date: 05/22/2019 Clinic: [facility]  Subjective:  Morgan Ramsey is a 43 y.o. [id number] at [redacted]w[redacted]d being seen today for ongoing prenatal care.  She is currently monitored for the following issues for this high-risk pregnancy and has Language barrier; H/O herpes simplex type 2 infection; Previous cesarean section; Obesity in pregnancy; Anemia; Supervision of high risk pregnancy, antepartum; History of VBAC; Advanced maternal age in multigravida; Type 2 diabetes mellitus during pregnancy, antepartum; Unwanted fertility; Abnormal genetic test during pregnancy; Obesity (BMI 30.0-34.9); and Transient hypertension of pregnancy on their problem list.  Patient reports no complaints.   Contractions: Not present. Vag. Bleeding: None.  Movement: Present. Denies leaking of fluid.   The following portions of the patient's history were reviewed and updated as appropriate: allergies, current medications, past family history, past medical history, past social history, past surgical history and problem list. Problem list updated.  Objective:   Vitals:   05/22/19 0944  BP: 114/74  Pulse: 89  Temp: 98.6 F (37 C)  Weight: 210 lb 8 oz (95.5 kg)    Fetal Status: Fetal Heart Rate (bpm): 152   Movement: Present     General:  Alert, oriented and cooperative. Patient is in no acute distress.  Skin: Skin is warm and dry. No rash noted.   Cardiovascular: Normal heart rate noted  Respiratory: Normal respiratory effort, no problems with respiration noted  Abdomen: Soft, gravid, appropriate for gestational age. Pain/Pressure: Present     Pelvic:  Cervical exam deferred        Extremities: Normal range of motion.  Edema: None  Mental Status: Normal mood and affect. Normal behavior. Normal judgment and thought content.   Urinalysis:      Assessment and Plan:  Pregnancy: [id number] at [redacted]w[redacted]d  1. Obesity (BMI 30.0-34.9)  2. Transient hypertension of pregnancy in second  trimester Normal today  3. History of VBAC Sign consent later in preg  4. Supervision of high risk pregnancy, antepartum Routine care. 28wk labs nv  5. Multigravida of advanced maternal age in second trimester Serial growth u/s, starting testing at 32wks. Delivery at 39wks F/u already scheduled 10/15 [place] growth u/s Normal on 9/19  6. Language barrier Interpreter used  7. Anemia, unspecified type F/u nv  8. Type 2 diabetes mellitus during pregnancy, antepartum Started nph 10/10 yesterday and stopped metformin then. 10/10 looks like it should be fine for now  9. H/O herpes simplex type 2 infection ppx start at 34-36wks  10. Unwanted fertility btl if for c/s  Preterm labor symptoms and general obstetric precautions including but not limited to vaginal bleeding, contractions, leaking of fluid and fetal movement were reviewed in detail with the patient. Please refer to After Visit Summary for other counseling recommendations.  RTC: already scheduled   Aletha Halim, MD

## 2019-05-22 NOTE — Progress Notes (Signed)
Pt has paper logs for Glucose levels.

## 2019-06-04 ENCOUNTER — Ambulatory Visit (INDEPENDENT_AMBULATORY_CARE_PROVIDER_SITE_OTHER): Payer: Self-pay | Admitting: Obstetrics & Gynecology

## 2019-06-04 ENCOUNTER — Encounter: Payer: Self-pay | Admitting: Family Medicine

## 2019-06-04 ENCOUNTER — Other Ambulatory Visit: Payer: Self-pay

## 2019-06-04 VITALS — BP 122/83 | HR 94 | Wt 214.4 lb

## 2019-06-04 DIAGNOSIS — O0992 Supervision of high risk pregnancy, unspecified, second trimester: Secondary | ICD-10-CM

## 2019-06-04 DIAGNOSIS — O24112 Pre-existing diabetes mellitus, type 2, in pregnancy, second trimester: Secondary | ICD-10-CM

## 2019-06-04 DIAGNOSIS — O24119 Pre-existing diabetes mellitus, type 2, in pregnancy, unspecified trimester: Secondary | ICD-10-CM

## 2019-06-04 DIAGNOSIS — Z3A27 27 weeks gestation of pregnancy: Secondary | ICD-10-CM

## 2019-06-04 DIAGNOSIS — O0990 Supervision of high risk pregnancy, unspecified, unspecified trimester: Secondary | ICD-10-CM

## 2019-06-04 MED ORDER — INSULIN NPH (HUMAN) (ISOPHANE) 100 UNIT/ML ~~LOC~~ SUSP: SUBCUTANEOUS | 6 refills | Status: DC

## 2019-06-04 NOTE — Patient Instructions (Signed)
Tercer trimestre de Media planner Third Trimester of Pregnancy  El tercer trimestre comprende desde la [employer] la [id number] (desde el mes7 hasta el mes9). En este trimestre, el beb en gestacin (feto) crece muy rpidamente. Hacia el final del noveno mes, el beb en gestacin mide alrededor de 20pulgadas (45cm) de largo. Pesa entre 6y 10libras [phone]). Siga estas indicaciones en su casa: Medicamentos  [employer] de venta libre y los recetados solamente como se lo haya indicado el mdico. Algunos medicamentos son seguros para tomar durante el Media planner y otros no lo son.  Tome vitaminas prenatales que contengan por lo menos [id number] (?g) de cido flico.  Si tiene dificultad para mover el intestino (estreimiento), tome un medicamento para ablandar las heces (laxante) si su mdico se lo autoriza. Comida y bebida   Ingiera alimentos saludables de [place] regular.  No coma carne cruda ni quesos sin cocinar.  Si obtiene poca cantidad de calcio de los alimentos que ingiere, consulte a su mdico sobre la posibilidad de tomar un suplemento diario de calcio.  La ingesta diaria de cuatro o cinco comidas pequeas en lugar de tres comidas abundantes.  Evite el consumo de alimentos ricos en grasas y azcares, como los alimentos fritos y los dulces.  Para evitar el estreimiento: ? Consuma alimentos ricos en fibra, como frutas y verduras frescas, cereales integrales y frijoles. ? Beba suficiente lquido para mantener el pis (orina) claro o de color amarillo plido. Actividad  Haga ejercicios solamente como se lo haya indicado el mdico. Interrumpa la actividad fsica si comienza a tener calambres.  No levante objetos pesados, use zapatos de tacones bajos y sintese derecha.  No haga ejercicio si hace demasiado calor, hay demasiada humedad o se encuentra en un lugar de mucha altura (altitud alta).  Puede continuar teniendo [employer], a menos que el  mdico le indique lo contrario. Alivio del dolor y del Tree surgeon  Use un sostn que le brinde buen soporte si sus mamas estn sensibles.  Haga pausas frecuentes y descanse con las piernas levantadas si tiene calambres en las piernas o dolor en la zona lumbar.  Dese baos de asiento con agua tibia para Best boy o las molestias causadas por las hemorroides. Use una crema para las hemorroides si el mdico la autoriza.  Si desarrolla venas hinchadas y abultadas (vrices) en las piernas: ? Use medias de compresin o medias de descanso como se lo haya indicado el mdico. ? Levante (eleve) los pies durante 103mnutos, 3 o 4veces por dTraining and development officer ? Limite el consumo de sal en sus alimentos. Seguridad  CMetLifecinturn de seguridad cuando conduzca.  Haga una lista de los nmeros de telfono de eFreight forwarder que iBJ'snmeros de telfono de familiares, amigos, eSt. Georgehospital, as como los departamentos de polica y bomberos. Preparacin para la llegada del beb Para prepararse para la llegada de su beb:  Tome clases prenatales.  Practique ir mGuardian Life Insuranceal hospital.  VPalo Verde Hospitaly recorra el rea de maternidad.  Hable en su trabajo acerca de tomar licencia cuando llegue el beb.  Prepare el bolso que llevar al hospital.  Prepare la habitacin del beb.  Concurra a los controles mdicos.  Compre un asiento de seguridad oAutoNationatrs para llevar al beb en el automvil. Aprenda cmo instalarlo en el auto. Instrucciones generales  No se d baos de inmersin en agua caliente, baos turcos ni saunas.  No consuma ningn producto que contenga nicotina o tabaco, como cigarrillos y cigarrillos  electrnicos. Si necesita ayuda para dejar de fumar, consulte al mdico.  No beba alcohol.  No se haga duchas vaginales ni use tampones o toallas higinicas perfumadas.  No mantenga las piernas cruzadas durante mucho tiempo.  No haga viajes de larga distancia, excepto si es  obligatorio. Hgalos solamente si su mdico la autoriza.  Visite a su dentista si no lo ha Quarry manager. Use un cepillo de cerdas suaves para cepillarse los dientes. Psese el hilo dental con suavidad.  Evite el contacto con las bandejas sanitarias de los gatos y la tierra que estos animales usan. Estos elementos contienen bacterias que pueden causar defectos congnitos al beb y la posible prdida del beb (aborto espontneo) o la muerte fetal.  Concurra a todas las visitas prenatales como se lo haya indicado el mdico. Esto es importante. Comunquese con un mdico si:  No est segura de si est en trabajo de parto o si ha roto la bolsa de las aguas.  Tiene mareos.  Tiene clicos leves o siente presin en la parte baja del vientre.  Sufre un dolor persistente en el abdomen.  Sigue teniendo [employer], vomita o tiene heces lquidas.  Advierte un lquido con olor ftido que proviene de la vagina.  Siente dolor al [employer]. Solicite ayuda de inmediato si:  Tiene fiebre.  Tiene una prdida de lquido por la vagina.  Tiene sangrado o pequeas prdidas vaginales.  Siente dolor intenso o clicos en el abdomen.  Aumenta o baja de peso rpidamente.  Tiene dificultades para recuperar el aliento y siente dolor en el pecho.  Sbitamente se le hinchan mucho el rostro, las [place], los tobillos, los pies o las piernas.  No ha sentido los movimientos del beb durante Leone Brand.  Siente un dolor de cabeza intenso que no se alivia con medicamentos.  Tiene dificultad para ver.  Tiene prdida de lquido o Chief Financial Officer chorro de lquido de la vagina antes de estar en la semana 37.  Tiene espasmos abdominales (contracciones) regulares antes de estar en la semana 70. Resumen  El tercer trimestre comprende desde la [employer] la [id number] (desde el mes7 hasta el mes9). Esta es la poca en que el beb en gestacin crece muy rpidamente.  Siga los consejos del mdico  con respecto a los medicamentos, la alimentacin y [place].  Preprese para la llegada del beb tomando las clases prenatales, preparando todo lo que necesitar el beb, arreglando la habitacin del beb y concurriendo a los controles mdicos.  Solicite ayuda de inmediato si tiene sangrado por la vagina, siente dolor en el pecho o tiene dificultad para respirar, o si no ha sentido que su beb se mueve en el transcurso de ms de [employer]. Esta informacin no tiene Marine scientist el consejo del mdico. Asegrese de hacerle al mdico cualquier pregunta que tenga. Document Released: 04/02/2013 Document Revised: 03/05/2017 Document Reviewed: 03/05/2017 Elsevier Patient Education  2020 [employer].

## 2019-06-04 NOTE — Progress Notes (Signed)
   PRENATAL VISIT NOTE  Subjective:  Morgan Ramsey is a 43 y.o. [id number] at [redacted]w[redacted]d being seen today for ongoing prenatal care.  She is currently monitored for the following issues for this high-risk pregnancy and has Language barrier; H/O herpes simplex type 2 infection; Previous cesarean section; Obesity in pregnancy; Anemia; Supervision of high risk pregnancy, antepartum; History of VBAC; Advanced maternal age in multigravida; Type 2 diabetes mellitus during pregnancy, antepartum; Unwanted fertility; Abnormal genetic test during pregnancy; Obesity (BMI 30.0-34.9); and Transient hypertension of pregnancy on their problem list.  Patient reports no complaints.  Contractions: Not present. Vag. Bleeding: None.  Movement: Present. Denies leaking of fluid.   The following portions of the patient's history were reviewed and updated as appropriate: allergies, current medications, past family history, past medical history, past social history, past surgical history and problem list.   Objective:   Vitals:   06/04/19 0954  BP: 122/83  Pulse: 94  Weight: 214 lb 6.4 oz (97.3 kg)    Fetal Status: Fetal Heart Rate (bpm): 134   Movement: Present     General:  Alert, oriented and cooperative. Patient is in no acute distress.  Skin: Skin is warm and dry. No rash noted.   Cardiovascular: Normal heart rate noted  Respiratory: Normal respiratory effort, no problems with respiration noted  Abdomen: Soft, gravid, appropriate for gestational age.  Pain/Pressure: Absent     Pelvic: Cervical exam deferred        Extremities: Normal range of motion.  Edema: None  Mental Status: Normal mood and affect. Normal behavior. Normal judgment and thought content.   Assessment and Plan:  Pregnancy: [id number] at [redacted]w[redacted]d 1. Supervision of high risk pregnancy, antepartum FBS 90-100 and pp 95-135, increase to 12 units [place] BID   2. Type 2 diabetes mellitus during pregnancy, antepartum  - insulin NPH Human (NOVOLIN N  RELION) 100 UNIT/ML injection; Relion Brand- 12 units at 8am & 12 units at 8pm  Dispense: 10 mL; Refill: 6  Preterm labor symptoms and general obstetric precautions including but not limited to vaginal bleeding, contractions, leaking of fluid and fetal movement were reviewed in detail with the patient. Please refer to After Visit Summary for other counseling recommendations.   Return in about 2 weeks (around 06/18/2019).  Future Appointments  Date Time Provider [place]  06/18/2019  9:35 AM Constant, Vickii Chafe, MD [facility]  07/02/2019  9:35 AM Constant, Vickii Chafe, MD [facility]    Emeterio Reeve, MD

## 2019-06-10 ENCOUNTER — Encounter: Payer: Self-pay | Admitting: *Deleted

## 2019-06-18 ENCOUNTER — Encounter: Payer: Self-pay | Admitting: Obstetrics and Gynecology

## 2019-06-18 ENCOUNTER — Other Ambulatory Visit: Payer: Self-pay

## 2019-06-18 ENCOUNTER — Ambulatory Visit (INDEPENDENT_AMBULATORY_CARE_PROVIDER_SITE_OTHER): Payer: Self-pay | Admitting: Obstetrics and Gynecology

## 2019-06-18 VITALS — BP 128/81 | HR 104 | Wt 214.2 lb

## 2019-06-18 DIAGNOSIS — O09523 Supervision of elderly multigravida, third trimester: Secondary | ICD-10-CM

## 2019-06-18 DIAGNOSIS — O0990 Supervision of high risk pregnancy, unspecified, unspecified trimester: Secondary | ICD-10-CM

## 2019-06-18 DIAGNOSIS — Z3009 Encounter for other general counseling and advice on contraception: Secondary | ICD-10-CM

## 2019-06-18 DIAGNOSIS — O24113 Pre-existing diabetes mellitus, type 2, in pregnancy, third trimester: Secondary | ICD-10-CM

## 2019-06-18 DIAGNOSIS — O0993 Supervision of high risk pregnancy, unspecified, third trimester: Secondary | ICD-10-CM

## 2019-06-18 DIAGNOSIS — Z3A29 29 weeks gestation of pregnancy: Secondary | ICD-10-CM

## 2019-06-18 DIAGNOSIS — Z98891 History of uterine scar from previous surgery: Secondary | ICD-10-CM

## 2019-06-18 DIAGNOSIS — Z8619 Personal history of other infectious and parasitic diseases: Secondary | ICD-10-CM

## 2019-06-18 DIAGNOSIS — O133 Gestational [pregnancy-induced] hypertension without significant proteinuria, third trimester: Secondary | ICD-10-CM

## 2019-06-18 DIAGNOSIS — O24119 Pre-existing diabetes mellitus, type 2, in pregnancy, unspecified trimester: Secondary | ICD-10-CM

## 2019-06-18 NOTE — Progress Notes (Signed)
Video Interpreter # [phone]

## 2019-06-18 NOTE — Progress Notes (Signed)
   PRENATAL VISIT NOTE  Subjective:  Morgan Ramsey is a 43 y.o. [id number] at [redacted]w[redacted]d being seen today for ongoing prenatal care.  She is currently monitored for the following issues for this high-risk pregnancy and has Language barrier; H/O herpes simplex type 2 infection; Previous cesarean section; Obesity in pregnancy; Anemia; Supervision of high risk pregnancy, antepartum; History of VBAC; Advanced maternal age in multigravida; Type 2 diabetes mellitus during pregnancy, antepartum; Unwanted fertility; Abnormal genetic test during pregnancy; Obesity (BMI 30.0-34.9); and Transient hypertension of pregnancy on their problem list.  Patient reports no complaints.  Contractions: Not present. Vag. Bleeding: None.  Movement: Present. Denies leaking of fluid.   The following portions of the patient's history were reviewed and updated as appropriate: allergies, current medications, past family history, past medical history, past social history, past surgical history and problem list.   Objective:   Vitals:   06/18/19 1001  BP: 128/81  Pulse: (!) 104  Weight: 214 lb 3.2 oz (97.2 kg)    Fetal Status: Fetal Heart Rate (bpm): 164   Movement: Present     General:  Alert, oriented and cooperative. Patient is in no acute distress.  Skin: Skin is warm and dry. No rash noted.   Cardiovascular: Normal heart rate noted  Respiratory: Normal respiratory effort, no problems with respiration noted  Abdomen: Soft, gravid, appropriate for gestational age.  Pain/Pressure: Present     Pelvic: Cervical exam deferred        Extremities: Normal range of motion.  Edema: None  Mental Status: Normal mood and affect. Normal behavior. Normal judgment and thought content.   Assessment and Plan:  Pregnancy: [id number] at [redacted]w[redacted]d 1. Supervision of high risk pregnancy, antepartum Patient is doing well without complaints  2. Type 2 diabetes mellitus during pregnancy, antepartum CBGs reviewed and great majority within  range. Continue 12 units BID 10/15 growth ultrasound reviewed wit EFW 959 gm (39%tile) Antenatal testing starting next visit Continue ASA  3. Transient hypertension of pregnancy in third trimester Normotensive today  4. Multigravida of advanced maternal age in third trimester   5. History of VBAC Desires another TOLAC- consent signed today  6. H/O herpes simplex type 2 infection Prophylaxis at 36 weeks  7. Unwanted fertility BTL if c-section  Preterm labor symptoms and general obstetric precautions including but not limited to vaginal bleeding, contractions, leaking of fluid and fetal movement were reviewed in detail with the patient. Please refer to After Visit Summary for other counseling recommendations.   Return in about 18 days (around 07/06/2019) for ROB, in person, NST/BPP.  Future Appointments  Date Time Provider [place]  07/02/2019  9:35 AM Katalia Choma, Vickii Chafe, MD [facility]    Mora Bellman, MD

## 2019-07-01 ENCOUNTER — Telehealth: Payer: Self-pay | Admitting: Obstetrics and Gynecology

## 2019-07-01 NOTE — Telephone Encounter (Signed)
Spanish interpreter Eda called patient about her appointment on 11/18 @ 9:35. Patient instructed to wear a face mask for the entire appointment and no visitors are allowed. Patient screened for covid symptoms and denied having any.

## 2019-07-02 ENCOUNTER — Other Ambulatory Visit: Payer: Self-pay

## 2019-07-02 ENCOUNTER — Ambulatory Visit (INDEPENDENT_AMBULATORY_CARE_PROVIDER_SITE_OTHER): Payer: Self-pay | Admitting: Obstetrics and Gynecology

## 2019-07-02 ENCOUNTER — Encounter: Payer: Self-pay | Admitting: Obstetrics and Gynecology

## 2019-07-02 VITALS — BP 113/70 | HR 103 | Wt 217.0 lb

## 2019-07-02 DIAGNOSIS — O0993 Supervision of high risk pregnancy, unspecified, third trimester: Secondary | ICD-10-CM

## 2019-07-02 DIAGNOSIS — Z3A31 31 weeks gestation of pregnancy: Secondary | ICD-10-CM

## 2019-07-02 DIAGNOSIS — Z98891 History of uterine scar from previous surgery: Secondary | ICD-10-CM

## 2019-07-02 DIAGNOSIS — O0990 Supervision of high risk pregnancy, unspecified, unspecified trimester: Secondary | ICD-10-CM

## 2019-07-02 DIAGNOSIS — O24119 Pre-existing diabetes mellitus, type 2, in pregnancy, unspecified trimester: Secondary | ICD-10-CM

## 2019-07-02 DIAGNOSIS — O133 Gestational [pregnancy-induced] hypertension without significant proteinuria, third trimester: Secondary | ICD-10-CM

## 2019-07-02 DIAGNOSIS — O09523 Supervision of elderly multigravida, third trimester: Secondary | ICD-10-CM

## 2019-07-02 DIAGNOSIS — O24113 Pre-existing diabetes mellitus, type 2, in pregnancy, third trimester: Secondary | ICD-10-CM

## 2019-07-02 NOTE — Progress Notes (Signed)
   PRENATAL VISIT NOTE  Subjective:  Morgan Ramsey is a 43 y.o. [id number] at [redacted]w[redacted]d being seen today for ongoing prenatal care.  She is currently monitored for the following issues for this high-risk pregnancy and has Language barrier; H/O herpes simplex type 2 infection; Previous cesarean section; Obesity in pregnancy; Anemia; Supervision of high risk pregnancy, antepartum; History of VBAC; Advanced maternal age in multigravida; Type 2 diabetes mellitus during pregnancy, antepartum; Unwanted fertility; Abnormal genetic test during pregnancy; Obesity (BMI 30.0-34.9); and Transient hypertension of pregnancy on their problem list.  Patient reports no complaints.  Contractions: Not present. Vag. Bleeding: None.  Movement: Present. Denies leaking of fluid.   The following portions of the patient's history were reviewed and updated as appropriate: allergies, current medications, past family history, past medical history, past social history, past surgical history and problem list.   Objective:   Vitals:   07/02/19 0953  BP: 113/70  Pulse: (!) 103  Weight: 217 lb (98.4 kg)    Fetal Status: Fetal Heart Rate (bpm): 153 Fundal Height: 33 cm Movement: Present     General:  Alert, oriented and cooperative. Patient is in no acute distress.  Skin: Skin is warm and dry. No rash noted.   Cardiovascular: Normal heart rate noted  Respiratory: Normal respiratory effort, no problems with respiration noted  Abdomen: Soft, gravid, appropriate for gestational age.  Pain/Pressure: Present     Pelvic: Cervical exam deferred        Extremities: Normal range of motion.  Edema: None  Mental Status: Normal mood and affect. Normal behavior. Normal judgment and thought content.   Assessment and Plan:  Pregnancy: [id number] at [redacted]w[redacted]d 1. Supervision of high risk pregnancy, antepartum Patient is doing well  2. Type 2 diabetes mellitus during pregnancy, antepartum CBGs reviewed and elevated fasting 98-100 Will  increase NPH in evening to 14 units and continue 12 units in am Continue ASA Antenatal testing to start weekly next visit Growth ultrasound with health department ordered  3. Multigravida of advanced maternal age in third trimester   4. Previous cesarean section Patient with a previous VBAC and desire for another TOLAC  5. Transient hypertension of pregnancy in third trimester Normotensive. Will continue to monitor  Preterm labor symptoms and general obstetric precautions including but not limited to vaginal bleeding, contractions, leaking of fluid and fetal movement were reviewed in detail with the patient. Please refer to After Visit Summary for other counseling recommendations.   Return in about 1 week (around 07/09/2019) for in person, ROB, NST/BPP.  No future appointments.  Mora Bellman, MD

## 2019-07-02 NOTE — Progress Notes (Signed)
U/s scheduled at [facility] 11/30 @ 9am

## 2019-07-15 ENCOUNTER — Telehealth: Payer: Self-pay | Admitting: Lactation Services

## 2019-07-15 NOTE — Telephone Encounter (Signed)
Called [employer] at [place] to request [place] results from 9/3 as not noted to be in chart at this time.

## 2019-07-17 ENCOUNTER — Other Ambulatory Visit: Payer: Self-pay

## 2019-07-17 ENCOUNTER — Ambulatory Visit: Payer: Self-pay

## 2019-07-17 ENCOUNTER — Ambulatory Visit (INDEPENDENT_AMBULATORY_CARE_PROVIDER_SITE_OTHER): Payer: Self-pay | Admitting: Obstetrics and Gynecology

## 2019-07-17 ENCOUNTER — Encounter: Payer: Self-pay | Admitting: Obstetrics and Gynecology

## 2019-07-17 ENCOUNTER — Ambulatory Visit (INDEPENDENT_AMBULATORY_CARE_PROVIDER_SITE_OTHER): Payer: Self-pay | Admitting: *Deleted

## 2019-07-17 ENCOUNTER — Encounter: Payer: Self-pay | Admitting: *Deleted

## 2019-07-17 VITALS — BP 130/74 | HR 97 | Wt 216.3 lb

## 2019-07-17 DIAGNOSIS — Z3A33 33 weeks gestation of pregnancy: Secondary | ICD-10-CM

## 2019-07-17 DIAGNOSIS — O24119 Pre-existing diabetes mellitus, type 2, in pregnancy, unspecified trimester: Secondary | ICD-10-CM

## 2019-07-17 DIAGNOSIS — O24113 Pre-existing diabetes mellitus, type 2, in pregnancy, third trimester: Secondary | ICD-10-CM

## 2019-07-17 DIAGNOSIS — O09523 Supervision of elderly multigravida, third trimester: Secondary | ICD-10-CM

## 2019-07-17 DIAGNOSIS — O0993 Supervision of high risk pregnancy, unspecified, third trimester: Secondary | ICD-10-CM

## 2019-07-17 DIAGNOSIS — O0990 Supervision of high risk pregnancy, unspecified, unspecified trimester: Secondary | ICD-10-CM

## 2019-07-17 DIAGNOSIS — Z98891 History of uterine scar from previous surgery: Secondary | ICD-10-CM

## 2019-07-17 NOTE — Progress Notes (Signed)
Subjective:  Morgan Ramsey is a 43 y.o. [id number] at [redacted]w[redacted]d being seen today for ongoing prenatal care.  She is currently monitored for the following issues for this high-risk pregnancy and has Language barrier; H/O herpes simplex type 2 infection; Previous cesarean section; Obesity in pregnancy; Anemia; Supervision of high risk pregnancy, antepartum; History of VBAC; Advanced maternal age in multigravida; Type 2 diabetes mellitus during pregnancy, antepartum; Unwanted fertility; Abnormal genetic test during pregnancy; Obesity (BMI 30.0-34.9); and Transient hypertension of pregnancy on their problem list.  Patient reports general discomforts of pregnancy.  Contractions: Not present. Vag. Bleeding: None.  Movement: Present. Denies leaking of fluid.   The following portions of the patient's history were reviewed and updated as appropriate: allergies, current medications, past family history, past medical history, past social history, past surgical history and problem list. Problem list updated.  Objective:   Vitals:   07/17/19 0833  BP: 130/74  Pulse: 97  Weight: 216 lb 4.8 oz (98.1 kg)    Fetal Status: Fetal Heart Rate (bpm): NST   Movement: Present     General:  Alert, oriented and cooperative. Patient is in no acute distress.  Skin: Skin is warm and dry. No rash noted.   Cardiovascular: Normal heart rate noted  Respiratory: Normal respiratory effort, no problems with respiration noted  Abdomen: Soft, gravid, appropriate for gestational age. Pain/Pressure: Present     Pelvic:  Cervical exam deferred        Extremities: Normal range of motion.  Edema: None  Mental Status: Normal mood and affect. Normal behavior. Normal judgment and thought content.   Urinalysis:      Assessment and Plan:  Pregnancy: [id number] at [redacted]w[redacted]d  1. Supervision of high risk pregnancy, antepartum Stable   2. Multigravida of advanced maternal age in third trimester SMA carrier  3. Type 2 diabetes mellitus  during pregnancy, antepartum CBG's improved with recent insulin change Will increase AM NPH to 14 units and PM NPH to 15 units Growth scan on 07/14/19 at [facility], results pending BPP/NSt today Continue with qd BASA and weekly testing  4. Previous cesarean section Desires TOLAC  5. History of VBAC TOLAC papers signed today Desires BTL if has c section  Preterm labor symptoms and general obstetric precautions including but not limited to vaginal bleeding, contractions, leaking of fluid and fetal movement were reviewed in detail with the patient. Please refer to After Visit Summary for other counseling recommendations.  Return in about 1 week (around 07/24/2019) for weekly [facility] and NST/BPP with MD .   Chancy Milroy, MD

## 2019-07-17 NOTE — Progress Notes (Signed)
Interpreter [employer] present for encounter.

## 2019-07-17 NOTE — Patient Instructions (Signed)
Tercer trimestre de Media planner Third Trimester of Pregnancy  El tercer trimestre comprende desde la [employer] la [id number] (desde el mes7 hasta el mes9). En este trimestre, el beb en gestacin (feto) crece muy rpidamente. Hacia el final del noveno mes, el beb en gestacin mide alrededor de 20pulgadas (45cm) de largo. Pesa entre 6y 10libras [phone]). Siga estas indicaciones en su casa: Medicamentos  [employer] de venta libre y los recetados solamente como se lo haya indicado el mdico. Algunos medicamentos son seguros para tomar durante el Media planner y otros no lo son.  Tome vitaminas prenatales que contengan por lo menos [id number] (?g) de cido flico.  Si tiene dificultad para mover el intestino (estreimiento), tome un medicamento para ablandar las heces (laxante) si su mdico se lo autoriza. Comida y bebida   Ingiera alimentos saludables de [place] regular.  No coma carne cruda ni quesos sin cocinar.  Si obtiene poca cantidad de calcio de los alimentos que ingiere, consulte a su mdico sobre la posibilidad de tomar un suplemento diario de calcio.  La ingesta diaria de cuatro o cinco comidas pequeas en lugar de tres comidas abundantes.  Evite el consumo de alimentos ricos en grasas y azcares, como los alimentos fritos y los dulces.  Para evitar el estreimiento: ? Consuma alimentos ricos en fibra, como frutas y verduras frescas, cereales integrales y frijoles. ? Beba suficiente lquido para mantener el pis (orina) claro o de color amarillo plido. Actividad  Haga ejercicios solamente como se lo haya indicado el mdico. Interrumpa la actividad fsica si comienza a tener calambres.  No levante objetos pesados, use zapatos de tacones bajos y sintese derecha.  No haga ejercicio si hace demasiado calor, hay demasiada humedad o se encuentra en un lugar de mucha altura (altitud alta).  Puede continuar teniendo [employer], a menos que el  mdico le indique lo contrario. Alivio del dolor y del Tree surgeon  Use un sostn que le brinde buen soporte si sus mamas estn sensibles.  Haga pausas frecuentes y descanse con las piernas levantadas si tiene calambres en las piernas o dolor en la zona lumbar.  Dese baos de asiento con agua tibia para Best boy o las molestias causadas por las hemorroides. Use una crema para las hemorroides si el mdico la autoriza.  Si desarrolla venas hinchadas y abultadas (vrices) en las piernas: ? Use medias de compresin o medias de descanso como se lo haya indicado el mdico. ? Levante (eleve) los pies durante 103mnutos, 3 o 4veces por dTraining and development officer ? Limite el consumo de sal en sus alimentos. Seguridad  CMetLifecinturn de seguridad cuando conduzca.  Haga una lista de los nmeros de telfono de eFreight forwarder que iBJ'snmeros de telfono de familiares, amigos, eSt. Georgehospital, as como los departamentos de polica y bomberos. Preparacin para la llegada del beb Para prepararse para la llegada de su beb:  Tome clases prenatales.  Practique ir mGuardian Life Insuranceal hospital.  VPalo Verde Hospitaly recorra el rea de maternidad.  Hable en su trabajo acerca de tomar licencia cuando llegue el beb.  Prepare el bolso que llevar al hospital.  Prepare la habitacin del beb.  Concurra a los controles mdicos.  Compre un asiento de seguridad oAutoNationatrs para llevar al beb en el automvil. Aprenda cmo instalarlo en el auto. Instrucciones generales  No se d baos de inmersin en agua caliente, baos turcos ni saunas.  No consuma ningn producto que contenga nicotina o tabaco, como cigarrillos y cigarrillos  electrnicos. Si necesita ayuda para dejar de fumar, consulte al mdico.  No beba alcohol.  No se haga duchas vaginales ni use tampones o toallas higinicas perfumadas.  No mantenga las piernas cruzadas durante mucho tiempo.  No haga viajes de larga distancia, excepto si es  obligatorio. Hgalos solamente si su mdico la autoriza.  Visite a su dentista si no lo ha Quarry manager. Use un cepillo de cerdas suaves para cepillarse los dientes. Psese el hilo dental con suavidad.  Evite el contacto con las bandejas sanitarias de los gatos y la tierra que estos animales usan. Estos elementos contienen bacterias que pueden causar defectos congnitos al beb y la posible prdida del beb (aborto espontneo) o la muerte fetal.  Concurra a todas las visitas prenatales como se lo haya indicado el mdico. Esto es importante. Comunquese con un mdico si:  No est segura de si est en trabajo de parto o si ha roto la bolsa de las aguas.  Tiene mareos.  Tiene clicos leves o siente presin en la parte baja del vientre.  Sufre un dolor persistente en el abdomen.  Sigue teniendo [employer], vomita o tiene heces lquidas.  Advierte un lquido con olor ftido que proviene de la vagina.  Siente dolor al [employer]. Solicite ayuda de inmediato si:  Tiene fiebre.  Tiene una prdida de lquido por la vagina.  Tiene sangrado o pequeas prdidas vaginales.  Siente dolor intenso o clicos en el abdomen.  Aumenta o baja de peso rpidamente.  Tiene dificultades para recuperar el aliento y siente dolor en el pecho.  Sbitamente se le hinchan mucho el rostro, las [place], los tobillos, los pies o las piernas.  No ha sentido los movimientos del beb durante Leone Brand.  Siente un dolor de cabeza intenso que no se alivia con medicamentos.  Tiene dificultad para ver.  Tiene prdida de lquido o Chief Financial Officer chorro de lquido de la vagina antes de estar en la semana 37.  Tiene espasmos abdominales (contracciones) regulares antes de estar en la semana 70. Resumen  El tercer trimestre comprende desde la [employer] la [id number] (desde el mes7 hasta el mes9). Esta es la poca en que el beb en gestacin crece muy rpidamente.  Siga los consejos del mdico  con respecto a los medicamentos, la alimentacin y [place].  Preprese para la llegada del beb tomando las clases prenatales, preparando todo lo que necesitar el beb, arreglando la habitacin del beb y concurriendo a los controles mdicos.  Solicite ayuda de inmediato si tiene sangrado por la vagina, siente dolor en el pecho o tiene dificultad para respirar, o si no ha sentido que su beb se mueve en el transcurso de ms de [employer]. Esta informacin no tiene Marine scientist el consejo del mdico. Asegrese de hacerle al mdico cualquier pregunta que tenga. Document Released: 04/02/2013 Document Revised: 03/05/2017 Document Reviewed: 03/05/2017 Elsevier Patient Education  2020 [employer].

## 2019-07-24 ENCOUNTER — Ambulatory Visit (INDEPENDENT_AMBULATORY_CARE_PROVIDER_SITE_OTHER): Payer: Self-pay | Admitting: *Deleted

## 2019-07-24 ENCOUNTER — Ambulatory Visit: Payer: Self-pay

## 2019-07-24 ENCOUNTER — Other Ambulatory Visit: Payer: Self-pay

## 2019-07-24 ENCOUNTER — Encounter: Payer: Self-pay | Admitting: Family Medicine

## 2019-07-24 ENCOUNTER — Ambulatory Visit (INDEPENDENT_AMBULATORY_CARE_PROVIDER_SITE_OTHER): Payer: Self-pay | Admitting: Family Medicine

## 2019-07-24 VITALS — BP 131/81 | HR 103 | Wt 222.1 lb

## 2019-07-24 DIAGNOSIS — Z789 Other specified health status: Secondary | ICD-10-CM

## 2019-07-24 DIAGNOSIS — O24113 Pre-existing diabetes mellitus, type 2, in pregnancy, third trimester: Secondary | ICD-10-CM

## 2019-07-24 DIAGNOSIS — O09523 Supervision of elderly multigravida, third trimester: Secondary | ICD-10-CM

## 2019-07-24 DIAGNOSIS — O133 Gestational [pregnancy-induced] hypertension without significant proteinuria, third trimester: Secondary | ICD-10-CM

## 2019-07-24 DIAGNOSIS — Z98891 History of uterine scar from previous surgery: Secondary | ICD-10-CM

## 2019-07-24 DIAGNOSIS — Z3A34 34 weeks gestation of pregnancy: Secondary | ICD-10-CM

## 2019-07-24 DIAGNOSIS — O0990 Supervision of high risk pregnancy, unspecified, unspecified trimester: Secondary | ICD-10-CM

## 2019-07-24 DIAGNOSIS — O0993 Supervision of high risk pregnancy, unspecified, third trimester: Secondary | ICD-10-CM

## 2019-07-24 DIAGNOSIS — O24119 Pre-existing diabetes mellitus, type 2, in pregnancy, unspecified trimester: Secondary | ICD-10-CM

## 2019-07-24 NOTE — Progress Notes (Signed)
   PRENATAL VISIT NOTE  Subjective:  Morgan Ramsey is a 43 y.o. [id number] at [redacted]w[redacted]d being seen today for ongoing prenatal care.  She is currently monitored for the following issues for this high-risk pregnancy and has Language barrier; H/O herpes simplex type 2 infection; Previous cesarean section; Obesity in pregnancy; Anemia; Supervision of high risk pregnancy, antepartum; History of VBAC; Advanced maternal age in multigravida; Type 2 diabetes mellitus during pregnancy, antepartum; Unwanted fertility; Abnormal genetic test during pregnancy; Obesity (BMI 30.0-34.9); and Transient hypertension of pregnancy on their problem list.  Patient reports no complaints.  Contractions: Irritability. Vag. Bleeding: None.  Movement: Present. Denies leaking of fluid.   The following portions of the patient's history were reviewed and updated as appropriate: allergies, current medications, past family history, past medical history, past social history, past surgical history and problem list.   Objective:   Vitals:   07/24/19 0915  BP: 131/81  Pulse: (!) 103  Weight: 222 lb 1.6 oz (100.7 kg)    Fetal Status: Fetal Heart Rate (bpm): 145 Fundal Height: 39 cm Movement: Present     General:  Alert, oriented and cooperative. Patient is in no acute distress.  Skin: Skin is warm and dry. No rash noted.   Cardiovascular: Normal heart rate noted  Respiratory: Normal respiratory effort, no problems with respiration noted  Abdomen: Soft, gravid, appropriate for gestational age.  Pain/Pressure: Present     Pelvic: Cervical exam deferred        Extremities: Normal range of motion.  Edema: None  Mental Status: Normal mood and affect. Normal behavior. Normal judgment and thought content.   Assessment and Plan:  Pregnancy: [id number] at [redacted]w[redacted]d 1. Transient hypertension of pregnancy in third trimester BP is ok today  2. Type 2 diabetes mellitus during pregnancy, antepartum 85% FBS 90-96 2 hour pp 112-126 many  CBGs are 125 and 126--will have pt. Bring meter and check it next visit. In testing, last u/s reveals 85%--consider 1 more prior to delivery Continue ASA, Insulin at these doses NST:  Baseline: 150 bpm, Variability: Good {> 6 bpm), Accelerations: Reactive and Decelerations: Absent    3. Previous cesarean section For TOLAC--consent signed  4. Language barrier Spanish interpreter: [name] used   5. Supervision of high risk pregnancy, antepartum   6. Multigravida of advanced maternal age in third trimester   Preterm labor symptoms and general obstetric precautions including but not limited to vaginal bleeding, contractions, leaking of fluid and fetal movement were reviewed in detail with the patient. Please refer to After Visit Summary for other counseling recommendations.   Return in about 1 week (around 07/31/2019) for [facility], needs MD, OB visit and BPP, in person.  Future Appointments  Date Time Provider [place]  07/30/2019  2:15 PM [facility] NST [facility]  07/30/2019  3:15 PM Constant, Vickii Chafe, MD [place]  08/06/2019  1:15 PM [facility] NST [place] [facility]  08/06/2019  2:15 PM Aletha Halim, MD [place]  08/13/2019  1:15 PM [facility] NST [place] [facility]  08/13/2019  2:15 PM Aletha Halim, MD [place]  08/20/2019  1:15 PM [facility] NST [facility]  08/20/2019  2:15 PM Sloan Leiter, MD [facility]    Donnamae Jude, MD

## 2019-07-24 NOTE — Patient Instructions (Signed)
Lactancia materna Breastfeeding  Decidir Economist es una de las mejores elecciones que puede hacer por usted y su beb. Un cambio en las hormonas durante el embarazo hace que las mamas produzcan leche materna en las glndulas productoras de [place]. Las hormonas impiden que la leche materna sea liberada antes del nacimiento del beb. Adems, impulsan el flujo de leche luego del nacimiento. Una vez que ha comenzado a Economist, Freight forwarder beb, as Therapist, occupational succin o Social research officer, government, pueden estimular la liberacin de [place] de las glndulas productoras de [place]. Los beneficios de [employer] investigaciones demuestran que la lactancia materna ofrece muchos beneficios de salud para bebs y [place]. Adems, ofrece una forma gratuita y conveniente de Research scientist (life sciences) al beb. Para el beb  La primera leche (calostro) ayuda a Garment/textile technologist funcionamiento del aparato digestivo del beb.  Las clulas especiales de la leche (anticuerpos) ayudan a Radio broadcast assistant las infecciones en el beb.  Los bebs que se alimentan con leche materna tambin tienen menos probabilidades de tener asma, alergias, obesidad o diabetes de tipo 2. Adems, tienen menor riesgo de sufrir el sndrome de muerte sbita del lactante (SMSL).  Dike son mejores para Engineer, water las necesidades del beb en comparacin con la Humana Inc.  La leche materna mejora el desarrollo cerebral del beb. Para usted  La lactancia materna favorece el desarrollo de un vnculo muy especial entre la madre y el beb.  Es conveniente. La leche materna es econmica y siempre est disponible a la Tree surgeon.  La lactancia materna ayuda a quemar caloras. Wyatt Mage a perder el peso ganado durante el [place].  Hace que el tero vuelva al tamao que tena antes del embarazo ms rpido. Adems, disminuye el sangrado (loquios) despus del parto.  La lactancia materna contribuye a reducir Catering manager de tener diabetes de tipo 2,  osteoporosis, artritis reumatoide, enfermedades cardiovasculares y cncer de mama, ovario, tero y endometrio en el futuro. Informacin bsica sobre la lactancia Comienzo de la lactancia  Encuentre un lugar cmodo para sentarse o Acupuncturist, con un buen respaldo para el cuello y la espalda.  Coloque una almohada o una manta enrollada debajo del beb para acomodarlo a la altura de la mama (si est sentada). Las almohadas para Economist se han diseado especialmente a fin de servir de apoyo para los brazos y el beb Kellogg.  Asegrese de que la barriga del beb (abdomen) est frente a la suya.  Mas[place]ee suavemente la mama. Con las yemas de los dedos, Apple Computer bordes exteriores de la mama hacia adentro, en direccin al pezn. Esto estimula el flujo de [place]. Si la [employer] Tim[employer], es posible que deba Clinical biochemist con este movimiento durante la Transport planner.  Sostenga la mama con 4 dedos por debajo y Counselling psychologist por arriba del pezn (forme la letra C con la mano). Asegrese de que los dedos se encuentren lejos del pezn y de la boca del beb.  Empuje suavemente los labios del beb con el pezn o con el dedo.  Cuando la boca del beb se abra lo suficiente, acrquelo rpidamente a la mama e introduzca todo el pezn y la arola, tanto como sea posible, dentro de la boca del beb. La arola es la zona de color que rodea al pezn. ? Debe haber ms arola visible por arriba del labio superior del beb que por debajo del labio inferior. ? Los labios del beb deben estar abiertos y extendidos hacia afuera (evertidos) para Electrical engineer  que el beb se prenda de forma [place] y cmoda. ? La lengua del beb debe estar entre la enca inferior y Scientist, research (medical).  Asegrese de que la boca del beb est en la posicin correcta alrededor del pezn (prendido). Los labios del beb deben crear un sello sobre la mama y estar doblados hacia afuera (invertidos).  Es comn que el beb succione durante 2 a 3 minutos  para que comience el flujo de [place]. Cmo debe prenderse Es muy importante que le ensee al beb cmo prenderse adecuadamente a la mama. Si el beb no se prende adecuadamente, puede causar DTE Energy Company, reducir la produccin de [place] materna y Field seismologist que el beb tenga un escaso aumento de [place]. Adems, si el beb no se prende adecuadamente al pezn, puede tragar aire durante la alimentacin. Esto puede causarle molestias al beb. Hacer eructar al beb al Eliezer Lofts de mama puede ayudarlo a liberar el aire. Sin embargo, ensearle al beb cmo prenderse a la mama adecuadamente es la mejor manera de evitar que se sienta molesto por tragar Administrator, sports se alimenta. Signos de que el beb se ha prendido adecuadamente al pezn  Tironea o succiona de modo silencioso, sin Education administrator. Los labios del beb deben estar extendidos hacia afuera (evertidos).  Se escucha que traga cada 3 o 4 succiones una vez que la Northeast Utilities ha comenzado a Airline pilot (despus de que se produzca el reflejo de eyeccin de la [place]).  Hay movimientos musculares por arriba y por delante de sus odos al Mining engineer. Signos de que el beb no se ha prendido Product manager al pezn  Hace ruidos de succin o de chasquido mientras se Haematologist.  Siente dolor en los pezones. Si cree que el beb no se prendi correctamente, deslice el dedo en la comisura de la boca y Micron Technology las encas del beb para interrumpir la succin. Intente volver a comenzar a Economist. Signos de Transport planner materna exitosa Signos del beb  El beb disminuir gradualmente el nmero de succiones o dejar de succionar por completo.  El beb se quedar dormido.  El cuerpo del beb se relajar.  El beb retendr Ardelia Mems pequea cantidad de [employer] Corporation boca.  El beb se desprender solo del [place]. Signos que presenta usted  Las mamas han aumentado la firmeza, el peso y el tamao 1 a 3 horas despus de Economist.  Estn ms blandas inmediatamente despus  de amamantar.  Se producen un aumento del volumen de [place] y un cambio en su consistencia y color [place].  Los pezones no duelen, no estn agrietados ni sangran. Signos de que su beb recibe la cantidad de leche suficiente  Mojar por lo menos 1 o 2paales durante las primeras 24horas despus del nacimiento.  Mojar por lo menos 5 o 6paales cada 24horas durante la primera semana despus del nacimiento. La orina debe ser clara o de color amarillo plido a los 5das de vida.  Mojar entre 6 y 8paales cada 24horas a medida que el beb sigue creciendo y desarrollndose.  Defeca por lo menos 3 veces en 24 horas a los 5 das de vida. Las heces deben ser blandas y Careers adviser.  Defeca por lo menos 3 veces en 24 horas a los [address] de vida. Las heces deben ser grumosas y Careers adviser.  No registra una prdida de peso mayor al 10% del peso al nacer durante los primeros [place].  Aumenta de peso un promedio de 4  a 7onzas (113 a 198g) por semana despus de los [place].  Aumenta de [place], [place], de [place] uniforme a Proofreader de los 5 das de vida, sin Museum/gallery curator prdida de peso despus de las 2semanas de vida. Despus de alimentarse, es posible que el beb regurgite una pequea cantidad de [place]. Esto es normal. Frecuencia y duracin de la lactancia El amamantamiento frecuente la ayudar a producir ms [place] y puede prevenir dolores en los pezones y las mamas extremadamente llenas (congestin [place]). Alimente al beb cuando muestre signos de hambre o si siente la necesidad de reducir la congestin de las [place]. Esto se denomina "lactancia a demanda". Las seales de que el beb tiene hambre incluyen las siguientes:  Aumento del [place] de [place], [place] o inquietud.  Mueve la cabeza de un lado a otro.  Abre la boca cuando se le toca la mejilla o la comisura de la boca (reflejo de bsqueda).  [place], tales como sonidos de  succin, se relame los labios, emite arrullos, suspiros o chirridos.  Mueve la [employer] boca y se chupa los dedos o las manos.  Est molesto o llora. Evite el uso del chupete en las primeras 4 a 6 semanas despus del nacimiento del beb. Despus de este perodo, podr usar un chupete. Las investigaciones demostraron que el uso del chupete durante Software engineer ao de vida del beb disminuye el riesgo de tener el sndrome de muerte sbita del lactante (SMSL). Permita que el nio se alimente en cada mama todo lo que desee. Cuando el beb se desprende o se queda dormido mientras se est alimentando de la primera mama, ofrzcale la segunda. Debido a que, con frecuencia, los recin nacidos estn somnolientos las primeras semanas de vida, es posible que deba despertar al beb para alimentarlo. Los horarios de Writer de un beb a otro. Sin embargo, las siguientes reglas pueden servir como gua para ayudarla a Engineer, materials que el beb se alimenta adecuadamente:  Se puede amamantar a los recin nacidos (bebs de 4 semanas o menos de vida) cada 1 a 3 horas.  No deben transcurrir ms de 3 horas durante el da o 5 horas durante la noche sin que se amamante a los recin nacidos.  Debe amamantar al beb un mnimo de 8 veces en un perodo de 24 horas. Extraccin de Owens Corning extraccin y Recruitment consultant de la leche materna le permiten asegurarse de que el beb se alimente exclusivamente de su leche materna, aun en momentos en los que no puede Economist. Esto tiene especial importancia si debe regresar al [place] en el perodo en que an est amamantando o si no puede estar presente en los momentos en que el beb debe alimentarse. Su asesor en lactancia puede ayudarla a Pension scheme manager un mtodo de extraccin que funcione mejor para usted y Aeronautical engineer cunto tiempo es [place]. Cmo cuidar las mamas durante la lactancia Los pezones pueden secarse, Medical illustrator y doler  durante la Transport planner. Las siguientes recomendaciones pueden ayudarla a Theatre manager las [employer] y sanas:  Art therapist usar jabn en los pezones.  Use un sostn de soporte diseado especialmente para la lactancia materna. Evite usar sostenes con aro o sostenes muy ajustados (sostenes deportivos).  Seque al aire sus pezones durante 3 a 50minutos despus de amamantar al beb.  Utilice solo apsitos de Chiropodist sostn para Tax adviser las prdidas de [place]. La prdida de un poco de Reg[employer] Corporation  las tomas es normal.  Utilice lanolina sobre los pezones luego de Economist. La lanolina ayuda a mantener la humedad normal de la piel. La lanolina pura no es perjudicial (no es txica) para el beb. Adems, puede extraer Cisco algunas gotas de [place] materna y Community education officer suavemente esa [employer]s pezones para que la [place] de los [place] se seque al aire. Durante las primeras semanas despus del nacimiento, algunas mujeres experimentan [place]. La congestin The Pepsi puede hacer que sienta las mamas pesadas, calientes y sensibles al tacto. El pico de la congestin mamaria ocurre en el plazo de los 3 a 5 das despus del [place]. Las siguientes recomendaciones pueden ayudarla a Public house manager la congestin mamaria:  Vace por completo las mamas al [place]. Puede aplicar calor hmedo en las mamas (en la ducha o con toallas hmedas para manos) antes de Economist o extraer Northeast Utilities. Esto aumenta la circulacin y [place] a que la [place]. Si el beb no vaca por completo las mamas cuando lo amamanta, extraiga la [place] restante despus de que haya finalizado.  Aplique compresas de hielo [employer] inmediatamente despus de Economist o extraer [place], a menos que le resulte demasiado incmodo. Haga lo siguiente: ? Ponga el hielo en una bolsa plstica. ? Coloque una Gen[employer] piel y la bolsa de hielo. ? Coloque el hielo durante 55minutos, 2 o 3veces por da.  Asegrese de que el beb  est prendido y se encuentre en la posicin correcta mientras lo alimenta. Si la congestin mamaria persiste luego de 48 horas o despus de seguir estas recomendaciones, comunquese con su mdico o un Lobbyist. Recomendaciones de salud general durante la lactancia  Consuma 3 comidas y 3 colaciones [place]. Las Toll Brothers bien alimentadas que amamantan necesitan entre 450 y [place] por Training and development officer. Puede cumplir con este requisito al aumentar la cantidad de una dieta equilibrada que realice.  Beba suficiente agua para mantener la orina clara o de color amarillo plido.  Descanse con frecuencia, reljese y siga tomando sus vitaminas prenatales para prevenir la fatiga, el estrs y los niveles bajos de vitaminas y [employer] Scientific en el cuerpo (deficiencias de nutrientes).  No consuma ningn producto que contenga nicotina o tabaco, como cigarrillos y Psychologist, sport and exercise. El beb puede verse afectado por las sustancias qumicas de los cigarrillos que pasan a la [place] materna y por la exposicin al humo ambiental del tabaco. Si necesita ayuda para dejar de fumar, consulte al mdico.  Evite el consumo de alcohol.  No consuma drogas ilegales o marihuana.  Antes de Engineer, manufacturing systems, hable con el mdico. Estos incluyen medicamentos recetados y de [place], como tambin vitaminas y suplementos a base de hierbas. Algunos medicamentos, que pueden ser perjudiciales para el beb, pueden pasar a travs de la SLM Corporation.  Puede quedar embarazada durante la lactancia. Si se desea un mtodo anticonceptivo, consulte al mdico sobre cules son [place]. Dnde encontrar ms informacin: Liga internacional [place]: [email or web address]. Comunquese con un mdico si:  Siente que quiere dejar de Economist o se siente frustrada con la lactancia.  Sus pezones estn agrietados o Control and instrumentation engineer.  Sus mamas estn irritadas, sensibles o  calientes.  Tiene los siguientes sntomas: ? Dolor en las mamas o en los pezones. ? Un rea hinchada en cualquiera de las mamas. ? Cristy Hilts o escalofros. ? Nuseas o vmitos. ? Drenaje de otro lquido distinto de la Northeast Utilities materna desde los pezones.  Sus mamas no  se llenan antes de amamantar al beb para el quinto da despus del [place].  Se siente triste y deprimida.  El beb: ? Est demasiado somnoliento como para comer bien. ? Tiene problemas para dormir. ? Tiene ms de 1 semana de vida y Albertson's de 6 paales en un periodo de 24 horas. ? No ha aumentado de peso a los [place].  El beb defeca menos de 3 veces en 24 horas.  La piel del beb o las partes blancas de los ojos se vuelven amarillentas. Solicite ayuda de inmediato si:  El beb est muy cansado Engineer, manufacturing) y no se quiere despertar para comer.  Le sube la fiebre sin causa. Resumen  La lactancia materna ofrece muchos beneficios de salud para bebs y [place].  Intente amamantar a su beb cuando muestre signos tempranos de hambre.  Haga cosquillas o empuje suavemente los labios del beb con el dedo o el pezn para lograr que el beb abra la boca. Acerque el beb a la mama. Asegrese de que la mayor parte de la arola se encuentre dentro de la boca del beb. Ofrzcale una mama y haga eructar al beb antes de pasar a la otra.  Hable con su mdico o asesor en lactancia si tiene dudas o problemas con la lactancia. Esta informacin no tiene Marine scientist el consejo del mdico. Asegrese de hacerle al mdico cualquier pregunta que tenga. Document Released: 07/31/2005 Document Revised: 10/25/2017 Document Reviewed: 11/20/2016 Elsevier Patient Education  2020 Reynolds [employer].

## 2019-07-30 ENCOUNTER — Ambulatory Visit: Payer: Self-pay

## 2019-07-30 ENCOUNTER — Ambulatory Visit (INDEPENDENT_AMBULATORY_CARE_PROVIDER_SITE_OTHER): Payer: Self-pay | Admitting: *Deleted

## 2019-07-30 ENCOUNTER — Other Ambulatory Visit: Payer: Self-pay

## 2019-07-30 ENCOUNTER — Ambulatory Visit (INDEPENDENT_AMBULATORY_CARE_PROVIDER_SITE_OTHER): Payer: Self-pay | Admitting: Obstetrics and Gynecology

## 2019-07-30 ENCOUNTER — Encounter: Payer: Self-pay | Admitting: Obstetrics and Gynecology

## 2019-07-30 VITALS — BP 117/74 | HR 95 | Wt 224.6 lb

## 2019-07-30 DIAGNOSIS — Z3009 Encounter for other general counseling and advice on contraception: Secondary | ICD-10-CM

## 2019-07-30 DIAGNOSIS — O09523 Supervision of elderly multigravida, third trimester: Secondary | ICD-10-CM

## 2019-07-30 DIAGNOSIS — Z98891 History of uterine scar from previous surgery: Secondary | ICD-10-CM

## 2019-07-30 DIAGNOSIS — O0990 Supervision of high risk pregnancy, unspecified, unspecified trimester: Secondary | ICD-10-CM

## 2019-07-30 DIAGNOSIS — O0993 Supervision of high risk pregnancy, unspecified, third trimester: Secondary | ICD-10-CM

## 2019-07-30 DIAGNOSIS — O24119 Pre-existing diabetes mellitus, type 2, in pregnancy, unspecified trimester: Secondary | ICD-10-CM

## 2019-07-30 DIAGNOSIS — Z3A35 35 weeks gestation of pregnancy: Secondary | ICD-10-CM

## 2019-07-30 DIAGNOSIS — O24113 Pre-existing diabetes mellitus, type 2, in pregnancy, third trimester: Secondary | ICD-10-CM

## 2019-07-30 DIAGNOSIS — Z8619 Personal history of other infectious and parasitic diseases: Secondary | ICD-10-CM

## 2019-07-30 DIAGNOSIS — Z789 Other specified health status: Secondary | ICD-10-CM

## 2019-07-30 MED ORDER — FLUCONAZOLE 150 MG PO TABS: 150.0000 mg | ORAL_TABLET | Freq: Once | ORAL | 0 refills | Status: AC

## 2019-07-30 NOTE — Progress Notes (Signed)

## 2019-07-30 NOTE — Progress Notes (Signed)
Interpreter present for encounter.

## 2019-07-30 NOTE — Progress Notes (Signed)
   PRENATAL VISIT NOTE  Subjective:  Morgan Ramsey is a 43 y.o. [id number] at [redacted]w[redacted]d being seen today for ongoing prenatal care.  She is currently monitored for the following issues for this high-risk pregnancy and has Language barrier; H/O herpes simplex type 2 infection; Previous cesarean section; Obesity in pregnancy; Anemia; Supervision of high risk pregnancy, antepartum; History of VBAC; Advanced maternal age in multigravida; Type 2 diabetes mellitus during pregnancy, antepartum; Unwanted fertility; Abnormal genetic test during pregnancy; Obesity (BMI 30.0-34.9); and Transient hypertension of pregnancy on their problem list.  Patient reports vaginal irritation.  Contractions: Irritability. Vag. Bleeding: None.  Movement: Present. Denies leaking of fluid.   The following portions of the patient's history were reviewed and updated as appropriate: allergies, current medications, past family history, past medical history, past social history, past surgical history and problem list.   Objective:   Vitals:   07/30/19 1430  BP: 117/74  Pulse: 95  Weight: 224 lb 9.6 oz (101.9 kg)    Fetal Status: Fetal Heart Rate (bpm): NST   Movement: Present     General:  Alert, oriented and cooperative. Patient is in no acute distress.  Skin: Skin is warm and dry. No rash noted.   Cardiovascular: Normal heart rate noted  Respiratory: Normal respiratory effort, no problems with respiration noted  Abdomen: Soft, gravid, appropriate for gestational age.  Pain/Pressure: Present     Pelvic: Cervical exam deferred        Extremities: Normal range of motion.  Edema: None  Mental Status: Normal mood and affect. Normal behavior. Normal judgment and thought content.   Assessment and Plan:  Pregnancy: [id number] at [redacted]w[redacted]d 1. Supervision of high risk pregnancy, antepartum Patient is doing well reporting vaginal pruritis Rx diflucan provided Cultures next visit  2. Type 2 diabetes mellitus during pregnancy,  antepartum CBGs reviewed and all fasting within range Continue current insulin dosage Will schedule growth ultrasound with health department at 38 weeks Continue antenatal testing BPP 10/10 today (NST baseline 140, mod variability, +accels, no decels)  3. Multigravida of advanced maternal age in third trimester   4. Language barrier Spanish interpreter present  5. Previous cesarean section Desires TOLAC Fetal presentation in transverse position- discussed ECV vs RPT C/S. Patient undecided at this time and was told that these options will be discussed again at 37 weeks if persistent fetal malpresentation  6. History of VBAC See above  7. Unwanted fertility Desires BTL if c-section  8. H/O herpes simplex type 2 infection Continue valtrex  Preterm labor symptoms and general obstetric precautions including but not limited to vaginal bleeding, contractions, leaking of fluid and fetal movement were reviewed in detail with the patient. Please refer to After Visit Summary for other counseling recommendations.   Return in about 1 week (around 08/06/2019) for in person, ROB, High risk.  Future Appointments  Date Time Provider [place]  07/30/2019  3:15 PM Hinata Diener, Vickii Chafe, MD [place]  07/30/2019  3:35 PM [place]  08/06/2019  1:15 PM WOC-WOCA NST WOC-WOCA WOC  08/06/2019  2:15 PM Aletha Halim, MD [place]  08/13/2019  1:15 PM WOC-WOCA NST WOC-WOCA WOC  08/13/2019  2:15 PM Aletha Halim, MD [place]  08/20/2019  1:15 PM WOC-WOCA NST WOC-WOCA WOC  08/20/2019  2:15 PM Sloan Leiter, MD [facility] WOC    Mora Bellman, MD

## 2019-07-31 ENCOUNTER — Encounter: Payer: Self-pay | Admitting: General Practice

## 2019-08-06 ENCOUNTER — Other Ambulatory Visit: Payer: Self-pay

## 2019-08-06 ENCOUNTER — Ambulatory Visit: Payer: Self-pay | Admitting: *Deleted

## 2019-08-06 ENCOUNTER — Ambulatory Visit (INDEPENDENT_AMBULATORY_CARE_PROVIDER_SITE_OTHER): Payer: Self-pay | Admitting: Obstetrics and Gynecology

## 2019-08-06 ENCOUNTER — Ambulatory Visit: Payer: Self-pay

## 2019-08-06 VITALS — BP 134/75 | HR 100 | Wt 220.5 lb

## 2019-08-06 DIAGNOSIS — Z8619 Personal history of other infectious and parasitic diseases: Secondary | ICD-10-CM

## 2019-08-06 DIAGNOSIS — Z789 Other specified health status: Secondary | ICD-10-CM

## 2019-08-06 DIAGNOSIS — O24119 Pre-existing diabetes mellitus, type 2, in pregnancy, unspecified trimester: Secondary | ICD-10-CM

## 2019-08-06 DIAGNOSIS — O09523 Supervision of elderly multigravida, third trimester: Secondary | ICD-10-CM

## 2019-08-06 DIAGNOSIS — Z3A36 36 weeks gestation of pregnancy: Secondary | ICD-10-CM

## 2019-08-06 DIAGNOSIS — Z113 Encounter for screening for infections with a predominantly sexual mode of transmission: Secondary | ICD-10-CM

## 2019-08-06 DIAGNOSIS — O133 Gestational [pregnancy-induced] hypertension without significant proteinuria, third trimester: Secondary | ICD-10-CM

## 2019-08-06 DIAGNOSIS — O0990 Supervision of high risk pregnancy, unspecified, unspecified trimester: Secondary | ICD-10-CM

## 2019-08-06 DIAGNOSIS — O409XX0 Polyhydramnios, unspecified trimester, not applicable or unspecified: Secondary | ICD-10-CM

## 2019-08-06 DIAGNOSIS — O0993 Supervision of high risk pregnancy, unspecified, third trimester: Secondary | ICD-10-CM

## 2019-08-06 DIAGNOSIS — O24113 Pre-existing diabetes mellitus, type 2, in pregnancy, third trimester: Secondary | ICD-10-CM

## 2019-08-06 DIAGNOSIS — O320XX0 Maternal care for unstable lie, not applicable or unspecified: Secondary | ICD-10-CM | POA: Insufficient documentation

## 2019-08-06 DIAGNOSIS — Z98891 History of uterine scar from previous surgery: Secondary | ICD-10-CM

## 2019-08-06 MED ORDER — VALACYCLOVIR HCL 1 G PO TABS: 1000.0000 mg | ORAL_TABLET | Freq: Every day | ORAL | 0 refills | Status: DC

## 2019-08-06 MED ORDER — INSULIN NPH (HUMAN) (ISOPHANE) 100 UNIT/ML ~~LOC~~ SUSP: SUBCUTANEOUS | 6 refills | Status: DC

## 2019-08-06 NOTE — Progress Notes (Signed)

## 2019-08-06 NOTE — Progress Notes (Addendum)
Prenatal Visit Note Date: 08/06/2019 Clinic: [facility]  Subjective:  Morgan Ramsey is a 43 y.o. [phone] at [redacted]w[redacted]d being seen today for ongoing prenatal care.  She is currently monitored for the following issues for this high-risk pregnancy and has Language barrier; H/O herpes simplex type 2 infection; Previous cesarean section; Obesity in pregnancy; Anemia; Supervision of high risk pregnancy, antepartum; History of VBAC; Advanced maternal age in multigravida; Type 2 diabetes mellitus during pregnancy, antepartum; Unwanted fertility; Abnormal genetic test during pregnancy; Obesity (BMI 30.0-34.9); Transient hypertension of pregnancy; and Unstable lie of fetus on their problem list.  Patient reports no complaints.   Contractions: Not present. Vag. Bleeding: None.  Movement: Present. Denies leaking of fluid.   The following portions of the patient's history were reviewed and updated as appropriate: allergies, current medications, past family history, past medical history, past social history, past surgical history and problem list. Problem list updated.  Objective:   Vitals:   08/06/19 1325  BP: 134/75  Pulse: 100  Weight: 220 lb 8 oz (100 kg)    Fetal Status: Fetal Heart Rate (bpm): NST   Movement: Present     General:  Alert, oriented and cooperative. Patient is in no acute distress.  Skin: Skin is warm and dry. No rash noted.   Cardiovascular: Normal heart rate noted  Respiratory: Normal respiratory effort, no problems with respiration noted  Abdomen: Soft, gravid, appropriate for gestational age. Pain/Pressure: Present     Pelvic:  Cervical exam deferred        Extremities: Normal range of motion.  Edema: None  Mental Status: Normal mood and affect. Normal behavior. Normal judgment and thought content.   Urinalysis:      Assessment and Plan:  Pregnancy: [id number] at [redacted]w[redacted]d  1. Supervision of high risk pregnancy, antepartum Routine care. Ask about BTL  if for c-section. Pt is self pay. Set up delivery date nv.  - GC/Chlamydia probe amp ([place])not at [facility] - Strep Gp B NAA  2. Multigravida of advanced maternal age in third trimester bpp 10/10 and transverse Continue with qwk testing Delivery at 39wks. See below  3. Type 2 diabetes mellitus during pregnancy, antepartum On NPH 14/15 with normal am fasting values and 2h PP with around 33% in the 120s-130s. Will increase am dose to 17 Has rpt growth with [place] on 11/31 - insulin NPH Human (NOVOLIN N RELION) 100 UNIT/ML injection; Relion Brand- 17 units with breakfast and 15 units before bed  Dispense: 10 mL; Refill: 6  4. Previous cesarean section  5. History of VBAC tolac consent already signed  6. Transient hypertension of pregnancy in third trimester Normal today  7. Language barrier Interpreter used   8. H/O herpes simplex type 2 infection Valtrex ppx sent in  9. Unstable fetal lie, single or unspecified fetus Transverse again today. Pt said first c/s was for this. Options d/w her and she prefers waiting until 39wks for delivery and ecv attempt at that time and if not successful then c-section. Risks, benefits, alternatives d/w her  10. Borderline Poly today MVP at 8.3. follow up next visit  Preterm labor symptoms and general obstetric precautions including but not limited to vaginal bleeding, contractions, leaking of fluid and fetal movement were reviewed in detail with the patient. Please refer to After Visit Summary for other counseling recommendations.  Return in about 1 week (around 08/13/2019) for as scheduled.   Aletha Halim, MD

## 2019-08-06 NOTE — Progress Notes (Signed)
Pt reports having a small amount of brown vaginal discharge.

## 2019-08-07 LAB — GC/CHLAMYDIA PROBE AMP (~~LOC~~) NOT AT ARMC
Chlamydia: NEGATIVE
Comment: NEGATIVE
Comment: NORMAL
Neisseria Gonorrhea: NEGATIVE

## 2019-08-08 LAB — STREP GP B NAA: Strep Gp B NAA: NEGATIVE

## 2019-08-13 ENCOUNTER — Ambulatory Visit (INDEPENDENT_AMBULATORY_CARE_PROVIDER_SITE_OTHER): Payer: Self-pay | Admitting: Obstetrics and Gynecology

## 2019-08-13 ENCOUNTER — Ambulatory Visit (INDEPENDENT_AMBULATORY_CARE_PROVIDER_SITE_OTHER): Payer: Self-pay | Admitting: *Deleted

## 2019-08-13 ENCOUNTER — Other Ambulatory Visit: Payer: Self-pay

## 2019-08-13 ENCOUNTER — Ambulatory Visit: Payer: Self-pay

## 2019-08-13 VITALS — BP 120/71 | HR 93 | Wt 226.0 lb

## 2019-08-13 DIAGNOSIS — O403XX0 Polyhydramnios, third trimester, not applicable or unspecified: Secondary | ICD-10-CM

## 2019-08-13 DIAGNOSIS — Z789 Other specified health status: Secondary | ICD-10-CM

## 2019-08-13 DIAGNOSIS — O0993 Supervision of high risk pregnancy, unspecified, third trimester: Secondary | ICD-10-CM

## 2019-08-13 DIAGNOSIS — O320XX0 Maternal care for unstable lie, not applicable or unspecified: Secondary | ICD-10-CM

## 2019-08-13 DIAGNOSIS — Z8619 Personal history of other infectious and parasitic diseases: Secondary | ICD-10-CM

## 2019-08-13 DIAGNOSIS — O24119 Pre-existing diabetes mellitus, type 2, in pregnancy, unspecified trimester: Secondary | ICD-10-CM

## 2019-08-13 DIAGNOSIS — O99210 Obesity complicating pregnancy, unspecified trimester: Secondary | ICD-10-CM

## 2019-08-13 DIAGNOSIS — O09523 Supervision of elderly multigravida, third trimester: Secondary | ICD-10-CM

## 2019-08-13 DIAGNOSIS — O24113 Pre-existing diabetes mellitus, type 2, in pregnancy, third trimester: Secondary | ICD-10-CM

## 2019-08-13 DIAGNOSIS — O0990 Supervision of high risk pregnancy, unspecified, unspecified trimester: Secondary | ICD-10-CM

## 2019-08-13 DIAGNOSIS — O133 Gestational [pregnancy-induced] hypertension without significant proteinuria, third trimester: Secondary | ICD-10-CM

## 2019-08-13 DIAGNOSIS — Z98891 History of uterine scar from previous surgery: Secondary | ICD-10-CM

## 2019-08-13 DIAGNOSIS — O99213 Obesity complicating pregnancy, third trimester: Secondary | ICD-10-CM

## 2019-08-13 DIAGNOSIS — Z3A37 37 weeks gestation of pregnancy: Secondary | ICD-10-CM

## 2019-08-13 DIAGNOSIS — Z3009 Encounter for other general counseling and advice on contraception: Secondary | ICD-10-CM

## 2019-08-13 NOTE — Progress Notes (Signed)
Interpreter Noelia present for encounter today. Pt has not been taking Valtrex because she did not understand that it was prescribed last week. She will obtain from pharmacy today.

## 2019-08-13 NOTE — Patient Instructions (Signed)
Take 7 units of insulin the night before you are to come to the hospital and not 15.  Do not take your insulin the morning you are to come to the hospital  Do not eat anything the day you are to come to the hospital

## 2019-08-13 NOTE — Progress Notes (Signed)

## 2019-08-13 NOTE — Progress Notes (Signed)
Prenatal Visit Note Date: 08/13/2019 Clinic: [facility]  Subjective:  Morgan Ramsey is a 43 y.o. [phone] at [redacted]w[redacted]d being seen today for ongoing prenatal care.  She is currently monitored for the following issues for this high-risk pregnancy and has Language barrier; H/O herpes simplex type 2 infection; Previous cesarean section; Obesity in pregnancy; Anemia; Supervision of high risk pregnancy, antepartum; History of VBAC; Advanced maternal age in multigravida; Type 2 diabetes mellitus during pregnancy, antepartum; Unwanted fertility; Abnormal genetic test during pregnancy; Obesity (BMI 30.0-34.9); Transient hypertension of pregnancy; and Unstable lie of fetus on their problem list.  Patient reports no complaints.   Contractions: Irregular. Vag. Bleeding: None.  Movement: Present. Denies leaking of fluid.   The following portions of the patient's history were reviewed and updated as appropriate: allergies, current medications, past family history, past medical history, past social history, past surgical history and problem list. Problem list updated.  Objective:   Vitals:   08/13/19 1417  BP: 120/71  Pulse: 93  Weight: 226 lb (102.5 kg)    Fetal Status: Fetal Heart Rate (bpm): NST   Movement: Present     General:  Alert, oriented and cooperative. Patient is in no acute distress.  Skin: Skin is warm and dry. No rash noted.   Cardiovascular: Normal heart rate noted  Respiratory: Normal respiratory effort, no problems with respiration noted  Abdomen: Soft, gravid, appropriate for gestational age. Pain/Pressure: Present     Pelvic:  Cervical exam deferred        Extremities: Normal range of motion.  Edema: None  Mental Status: Normal mood and affect. Normal behavior. Normal judgment and thought content.   Urinalysis:      Assessment and Plan:  Pregnancy: [id number] at [redacted]w[redacted]d  1. Supervision of high risk pregnancy, antepartum Routine care  2. Type 2  diabetes mellitus during pregnancy, antepartum Good cbg control on nph 17/15 Slight poly at 26 today with an MVP of 8. D/w Dr. Annamaria Boots who recommends 38wk delivery bpp 10/10 today  has growth u/s with [place] tomorrow  3. Multigravida of advanced maternal age in third trimester  4. Previous cesarean section  5. History of VBAC tolac consent already signed  6. H/O herpes simplex type 2 infection Start valtrex ppx today  7. Obesity in pregnancy  8. Unstable fetal lie, single or unspecified fetus ceph today.  Recommend scan on L&D admit D/w her to be NPO after midnight and to half her pm insulin dose and hold her am dose in case she needs ecv, c-section  9. Transient hypertension of pregnancy in third trimester Normal today  10. Language barrier Interpreter used  11. Unwanted fertility btl if for c/s. Pt is self pay  Term labor symptoms and general obstetric precautions including but not limited to vaginal bleeding, contractions, leaking of fluid and fetal movement were reviewed in detail with the patient. Please refer to After Visit Summary for other counseling recommendations.  Return in about 6 weeks (around 09/24/2019) for 6-7wks pp visit, 2hr GTT, in person.   Aletha Halim, MD

## 2019-08-14 ENCOUNTER — Telehealth (HOSPITAL_COMMUNITY): Payer: Self-pay | Admitting: *Deleted

## 2019-08-14 NOTE — Telephone Encounter (Signed)
Preadmission screen Interpreter number [phone]

## 2019-08-15 ENCOUNTER — Other Ambulatory Visit: Payer: Self-pay | Admitting: Advanced Practice Midwife

## 2019-08-15 NOTE — L&D Delivery Note (Signed)
OB/GYN Faculty Practice Delivery Note  Morgan Ramsey is a 44 y.o. [id number] s/p vacuum-assisted vaginal delivery/VBAC at [redacted]w[redacted]d. She was admitted for IOL for T2GDM in setting of TOLAC AMA.   GBS Status: --Henderson Cloud (12/23 1502) Maximum Maternal Temperature: Temp (24hrs), Avg:98.5 F (36.9 C), Min:97.6 F (36.4 C), Max:101 F (38.3 C)   Labor Progress: . Admitted for IOL A2GDM, TOLAC  . AROM 13h 81m prior to delivery with clear fluid  . Pitocin until complete dilation achieved.   Delivery Date/Time: 08/19/2019 at 0554 Delivery: Called to room and started to push with patient. Patient pushed for 1 hour and 45 minutes, during the last 30 minutes of which maternal pushing effort started to decline. Because of this, and maternal fatigue, a vacuum-assisted vaginal delivery was offered and Dr. Rip Harbour called for evaluation- see note by [facility] Fellow below.  Head delivered ROA.  Nuchal cord present x 1 which was delivered through. Shoulder and body delivered in usual fashion. Infant with spontaneous cry, placed skin to skin on mother's abdomen, dried and stimulated. Cord clamped x 2 after 1-minute delay, and cut by FOB under my direct supervision. Cord blood drawn. Placenta delivered spontaneously with gentle cord traction. Fundus firm with massage and Pitocin. Due to prolonged pitocin usage, TXA was given after delivery. Labia, perineum, vagina, and cervix inspected with a 3a laceration noted. This was repaired with Vicryl in the usual fashion by Dr. Darene Lamer under the supervision of Dr. Rip Harbour.   Placenta: spontaneous , intact  Complications: Vacuum-assisted vaginal delivery Lacerations: 3a perineal QBL: 158 mL  Analgesia: Epidural anesthesia  Postpartum Planning Mom and baby to mother/baby.  . Lactation consult . AM CBC . Contraception Depo at the HD   Language Barrier . In person interpretor present in room during delivery  Infant: Viable female  APGAR: 9 & 9  weight per medical  record  Zettie Cooley, M.D.  08/19/2019 6:24 AM  GME ATTESTATION:  I was gowned and gloved for the entire delivery. I personally performed the vacuum-assisted delivery and 3a perineal laceration repair under the supervision of my attending, Dr. Rip Harbour. I agree with the findings and the plan of care as documented in the resident's note, with addition of the following:  Vacuum-assisted vaginal delivery note: Indication for operative vaginal delivery: Maternal fatigue, decreased maternal effort with pushing.  Risks of vacuum assistance were discussed in detail, including but not limited to, bleeding, infection, damage to maternal tissues, fetal cephalohematoma, inability to effect vaginal delivery of the head or shoulder dystocia that cannot be resolved by established maneuvers and need for emergency cesarean section.  Patient gave verbal consent.  Patient was examined and found to be fully dilated with fetal station of +2. The Kiwi vacuum soft cup was positioned over the sagittal suture 3 cm anterior to posterior fontanelle.  Pressure was then increased to 500 mmHg, and the patient was instructed to push.  Pulling was administered along the pelvic curve.  The vacuum was used to pull along the pelvic curve during 2 contractions, 6 pushes, 0 popoffs.  The infant was then delivered atraumatically, through a nuchal, noted to be a viable female infant, Apgars of 9 and 9, weight per medical record.  There was spontaneous placental delivery, intact with three-vessel cord. 3a perineal laceration noted, repaired in the usual fashion. QBL 158 mL, epidural anesthesia.   Sponge, instrument and needle counts were correct x2.  The patient and baby were stable after delivery and remained in couplet care. Neonatology present for delivery.  Merilyn Baba, DO OB Fellow, [place] for [place] 08/19/2019 6:44 AM

## 2019-08-16 ENCOUNTER — Other Ambulatory Visit (HOSPITAL_COMMUNITY)
Admission: RE | Admit: 2019-08-16 | Discharge: 2019-08-16 | Disposition: A | Discharge status: Home / Self Care | Payer: Medicaid Other | Source: Ambulatory Visit | Attending: Family Medicine | Admitting: Family Medicine

## 2019-08-16 DIAGNOSIS — Z01812 Encounter for preprocedural laboratory examination: Secondary | ICD-10-CM | POA: Insufficient documentation

## 2019-08-16 DIAGNOSIS — Z20822 Contact with and (suspected) exposure to covid-19: Secondary | ICD-10-CM | POA: Insufficient documentation

## 2019-08-16 LAB — SARS CORONAVIRUS 2 (TAT 6-24 HRS): SARS Coronavirus 2: NEGATIVE

## 2019-08-17 ENCOUNTER — Encounter (HOSPITAL_COMMUNITY): Payer: Self-pay | Admitting: Obstetrics and Gynecology

## 2019-08-17 ENCOUNTER — Inpatient Hospital Stay (HOSPITAL_COMMUNITY)
Admission: AD | Admit: 2019-08-17 | Discharge: 2019-08-21 | DRG: 768 | Disposition: A | Discharge status: Home / Self Care | Payer: Medicaid Other | Attending: Obstetrics and Gynecology | Admitting: Obstetrics and Gynecology

## 2019-08-17 ENCOUNTER — Inpatient Hospital Stay (HOSPITAL_COMMUNITY): Payer: Medicaid Other

## 2019-08-17 ENCOUNTER — Other Ambulatory Visit: Payer: Self-pay

## 2019-08-17 DIAGNOSIS — Z3A38 38 weeks gestation of pregnancy: Secondary | ICD-10-CM | POA: Diagnosis not present

## 2019-08-17 DIAGNOSIS — Z20822 Contact with and (suspected) exposure to covid-19: Secondary | ICD-10-CM | POA: Diagnosis present

## 2019-08-17 DIAGNOSIS — O34211 Maternal care for low transverse scar from previous cesarean delivery: Secondary | ICD-10-CM | POA: Diagnosis present

## 2019-08-17 DIAGNOSIS — Z01812 Encounter for preprocedural laboratory examination: Secondary | ICD-10-CM

## 2019-08-17 DIAGNOSIS — Z98891 History of uterine scar from previous surgery: Secondary | ICD-10-CM

## 2019-08-17 DIAGNOSIS — O99210 Obesity complicating pregnancy, unspecified trimester: Secondary | ICD-10-CM | POA: Diagnosis present

## 2019-08-17 DIAGNOSIS — O403XX0 Polyhydramnios, third trimester, not applicable or unspecified: Secondary | ICD-10-CM | POA: Diagnosis present

## 2019-08-17 DIAGNOSIS — O9902 Anemia complicating childbirth: Secondary | ICD-10-CM | POA: Diagnosis present

## 2019-08-17 DIAGNOSIS — Z3009 Encounter for other general counseling and advice on contraception: Secondary | ICD-10-CM

## 2019-08-17 DIAGNOSIS — O409XX0 Polyhydramnios, unspecified trimester, not applicable or unspecified: Secondary | ICD-10-CM | POA: Diagnosis present

## 2019-08-17 DIAGNOSIS — O134 Gestational [pregnancy-induced] hypertension without significant proteinuria, complicating childbirth: Secondary | ICD-10-CM | POA: Diagnosis present

## 2019-08-17 DIAGNOSIS — O0990 Supervision of high risk pregnancy, unspecified, unspecified trimester: Secondary | ICD-10-CM

## 2019-08-17 DIAGNOSIS — O09529 Supervision of elderly multigravida, unspecified trimester: Secondary | ICD-10-CM

## 2019-08-17 DIAGNOSIS — D649 Anemia, unspecified: Secondary | ICD-10-CM | POA: Diagnosis present

## 2019-08-17 DIAGNOSIS — O24424 Gestational diabetes mellitus in childbirth, insulin controlled: Principal | ICD-10-CM | POA: Diagnosis present

## 2019-08-17 DIAGNOSIS — O24119 Pre-existing diabetes mellitus, type 2, in pregnancy, unspecified trimester: Secondary | ICD-10-CM

## 2019-08-17 DIAGNOSIS — O99214 Obesity complicating childbirth: Secondary | ICD-10-CM | POA: Diagnosis present

## 2019-08-17 DIAGNOSIS — O285 Abnormal chromosomal and genetic finding on antenatal screening of mother: Secondary | ICD-10-CM

## 2019-08-17 DIAGNOSIS — O09523 Supervision of elderly multigravida, third trimester: Secondary | ICD-10-CM

## 2019-08-17 DIAGNOSIS — Z789 Other specified health status: Secondary | ICD-10-CM | POA: Diagnosis present

## 2019-08-17 LAB — GLUCOSE, CAPILLARY
Glucose-Capillary: 109 mg/dL — ABNORMAL HIGH (ref 70–99)
Glucose-Capillary: 139 mg/dL — ABNORMAL HIGH (ref 70–99)
Glucose-Capillary: 72 mg/dL (ref 70–99)
Glucose-Capillary: 77 mg/dL (ref 70–99)
Glucose-Capillary: 88 mg/dL (ref 70–99)

## 2019-08-17 LAB — TYPE AND SCREEN
ABO/RH(D): O POS
Antibody Screen: NEGATIVE

## 2019-08-17 LAB — CBC
HCT: 36.5 % (ref 36.0–46.0)
Hemoglobin: 11.5 g/dL — ABNORMAL LOW (ref 12.0–15.0)
MCH: 23 pg — ABNORMAL LOW (ref 26.0–34.0)
MCHC: 31.5 g/dL (ref 30.0–36.0)
MCV: 73.1 fL — ABNORMAL LOW (ref 80.0–100.0)
Platelets: 243 K/uL (ref 150–400)
RBC: 4.99 MIL/uL (ref 3.87–5.11)
RDW: 17.1 % — ABNORMAL HIGH (ref 11.5–15.5)
WBC: 8.7 K/uL (ref 4.0–10.5)
nRBC: 0 % (ref 0.0–0.2)

## 2019-08-17 LAB — ABO/RH: ABO/RH(D): O POS

## 2019-08-17 MED ORDER — OXYTOCIN 40 UNITS IN NORMAL SALINE INFUSION - SIMPLE MED
2.5000 [IU]/h | INTRAVENOUS | Status: DC
  Filled 2019-08-17: qty 1000

## 2019-08-17 MED ORDER — TERBUTALINE SULFATE 1 MG/ML IJ SOLN: 0.2500 mg | Freq: Once | INTRAMUSCULAR | Status: DC | PRN

## 2019-08-17 MED ORDER — LIDOCAINE HCL (PF) 1 % IJ SOLN: 30.0000 mL | INTRAMUSCULAR | Status: DC | PRN

## 2019-08-17 MED ORDER — OXYTOCIN BOLUS FROM INFUSION
500.0000 mL | Freq: Once | INTRAVENOUS | Status: AC
  Administered 2019-08-19: 500 mL via INTRAVENOUS

## 2019-08-17 MED ORDER — FENTANYL CITRATE (PF) 100 MCG/2ML IJ SOLN
50.0000 ug | INTRAMUSCULAR | Status: AC | PRN
  Administered 2019-08-18 (×3): 50 ug via INTRAVENOUS
  Filled 2019-08-17 (×3): qty 2

## 2019-08-17 MED ORDER — LACTATED RINGERS IV SOLN: INTRAVENOUS | Status: DC

## 2019-08-17 MED ORDER — VALACYCLOVIR HCL 500 MG PO TABS
500.0000 mg | ORAL_TABLET | Freq: Two times a day (BID) | ORAL | Status: DC
  Administered 2019-08-18 (×3): 500 mg via ORAL
  Filled 2019-08-17 (×3): qty 1

## 2019-08-17 MED ORDER — OXYTOCIN 40 UNITS IN NORMAL SALINE INFUSION - SIMPLE MED
1.0000 m[IU]/min | INTRAVENOUS | Status: DC
  Administered 2019-08-17: 09:00:00 2 m[IU]/min via INTRAVENOUS
  Administered 2019-08-18: 16 m[IU]/min via INTRAVENOUS
  Filled 2019-08-17: qty 1000

## 2019-08-17 MED ORDER — ONDANSETRON HCL 4 MG/2ML IJ SOLN: 4.0000 mg | Freq: Four times a day (QID) | INTRAMUSCULAR | Status: DC | PRN

## 2019-08-17 MED ORDER — ACETAMINOPHEN 325 MG PO TABS
650.0000 mg | ORAL_TABLET | ORAL | Status: DC | PRN
  Administered 2019-08-18 – 2019-08-19 (×2): 650 mg via ORAL
  Filled 2019-08-17 (×2): qty 2

## 2019-08-17 MED ORDER — LACTATED RINGERS IV SOLN: 500.0000 mL | INTRAVENOUS | Status: DC | PRN

## 2019-08-17 NOTE — Progress Notes (Addendum)
Abdominal binder placed, used face-to-face interpreter

## 2019-08-17 NOTE — Progress Notes (Signed)
Morgan Ramsey is a 44 y.o. [id number] at [redacted]w[redacted]d   Subjective: Starting to have pain. Declines any pain medication at this time, but maybe later.   Objective: BP 130/82   Pulse 75   Temp 98.6 F (37 C)   Resp 16   Ht 5\' 3"  (1.6 m)   Wt 103.7 kg   LMP 11/24/2018 (Exact Date)   BMI 40.49 kg/m  No intake/output data recorded. No intake/output data recorded.  FHT:  FHR: 135 bpm, variability: moderate,  accelerations:  Present,  decelerations:  Present Variable UC:   regular, every 2-4 minutes SVE:   Dilation: Fingertip Effacement (%): Thick Station: Ballotable Exam by:: Daneil Beem MD  Labs: Lab Results  Component Value Date   WBC 8.7 08/17/2019   HGB 11.5 (L) 08/17/2019   HCT 36.5 08/17/2019   MCV 73.1 (L) 08/17/2019   PLT 243 08/17/2019    Assessment / Plan: Induction of labor due to DM,  progressing well on pitocin  Labor: progressing on pitocin. Patient with change in CE. She did not tolerate the CE well, thus will wait for FB placement. Recheck in 3 hours.  Fetal Wellbeing:  Category II Pain Control:  Labor support without medications I/D:  n/a Anticipated MOD:  NSVD  Wilber Oliphant 08/17/2019, 11:57 PM

## 2019-08-17 NOTE — Progress Notes (Signed)
Stefanie Shrider is a 44 y.o. [id number] at [redacted]w[redacted]d admitted for induction of labor due to Diabetes.  Subjective: Doing well no specific complaints.  Objective: BP 126/76   Pulse 82   Temp 98.6 F (37 C) (Oral)   Resp 16   Ht 5\' 3"  (1.6 m)   Wt 103.7 kg   LMP 11/24/2018 (Exact Date)   BMI 40.49 kg/m  No intake/output data recorded. No intake/output data recorded.  FHT:  FHR: 150 bpm, variability: moderate,  accelerations:  Present,  decelerations:  Absent UC:   Irregular, q 2-4 minutes  SVE:   Dilation: Fingertip Effacement (%): Thick Station: Ballotable Exam by:: Amayiah Gosnell MD  Labs: Lab Results  Component Value Date   WBC 8.7 08/17/2019   HGB 11.5 (L) 08/17/2019   HCT 36.5 08/17/2019   MCV 73.1 (L) 08/17/2019   PLT 243 08/17/2019    Assessment / Plan: Induction of labor for type 2 diabetes.  Patient has had no significant cervical change since last check.  Plans to check again in about 3 hours.  Contractions irregular.  Labor: Progressing on Pitocin, will continue to increase then AROM (50mu/min) DM: sugars well controlled 70-100  Fetal Wellbeing:  Category I Pain Control:  Labor support without medications I/D:  n/a Anticipated MOD:  NSVD  Wilber Oliphant 08/17/2019, 9:45 PM

## 2019-08-17 NOTE — Progress Notes (Signed)
Morgan Ramsey is a 44 y.o. [id number] at [redacted]w[redacted]d   Subjective: Denies complaints or concerns. Continues to desire TOLAC  Objective: BP 133/65   Pulse 78   Temp 98.1 F (36.7 C) (Oral)   Resp 18   Ht 5\' 3"  (1.6 m)   Wt 103.7 kg   LMP 11/24/2018 (Exact Date)   BMI 40.49 kg/m  No intake/output data recorded. No intake/output data recorded.  FHT:  FHR: 135 bpm, variability: moderate,  accelerations:  Present,  decelerations:  Absent UC:   irregular, every 3-4 minutes SVE:   Dilation: Fingertip Effacement (%): Thick Station: Ballotable Exam by:: [employer]: Lab Results  Component Value Date   WBC 8.7 08/17/2019   HGB 11.5 (L) 08/17/2019   HCT 36.5 08/17/2019   MCV 73.1 (L) 08/17/2019   PLT 243 08/17/2019    Assessment / Plan: --44 y.o. [id number] at [redacted]w[redacted]d  --Category I tracing --Low dose Pitocin started at 0855 for cervical ripening in setting of TOLAC --Updated VE: Remains closed internally, more anterior and softer than previous --CBGs well controlled, continue q 4 until active labor or as indicated by irregular results --Anticipate TOLAC/VBAC  Darlina Rumpf 08/17/2019, 5:33 PM

## 2019-08-17 NOTE — Progress Notes (Signed)
Derisha Hobart is a 44 y.o. [id number] at [redacted]w[redacted]d   Subjective: Patient continues to desire TOLAC. Denies questions or concerns  Objective: BP 118/76   Pulse 81   Temp 98.1 F (36.7 C) (Oral)   Resp 18   Ht 5\' 3"  (1.6 m)   Wt 103.7 kg   LMP 11/24/2018 (Exact Date)   BMI 40.49 kg/m  No intake/output data recorded. No intake/output data recorded.  FHT:  FHR: 150 bpm, variability: moderate,  accelerations:  Present,  decelerations:  Absent UC:   irregular, every 3-4 minutes SVE:   Dilation: Fingertip Effacement (%): Thick Station: Ballotable Exam by:: [employer]: Lab Results  Component Value Date   WBC 8.7 08/17/2019   HGB 11.5 (L) 08/17/2019   HCT 36.5 08/17/2019   MCV 73.1 (L) 08/17/2019   PLT 243 08/17/2019    Assessment / Plan: --44 y.o. G5P3013 at [redacted]w[redacted]d for TOLAC --Category I tracing --Remains FT/thick/very posterior --Continue low dose Pitocin, reassess for foley balloon in four hours  TDM --Q 4 hour CBGs --Clear liquid diet --Asymptomatic  Language barrier: Spanish language interpreter [place] present for all patient interaction Darlina Rumpf, CNM 08/17/2019, 1:47 PM

## 2019-08-17 NOTE — H&P (Signed)
Morgan Ramsey is a 44 y.o. female  G5P3013 at 38+0 by LMP C/W 21 week [place], who presents for IOL for T2DM well controlled with insulin. This pregnancy was complicated by:  - language barrier - previous c/s and VBAC - GDM - AMA - hx of HSV 2 - unwanted fertility  - transient HTN of pregnancy   Ob history is significant for NSVD with first delivery, LTCS with second delivery and VBAC for third delivery in 2017. All at term. She has a VBAC consent signed on 07/17/19.   Ultrasounds: 05/01/19  05/29/19 - normal [place]  07/14/19 - 33+1-  growth at 85th %ile, previously at 40th. HC corresponding with 37+0. AC corresponding with 34+6.  07/30/19 - order in chart; however, no results available.   OB History # 1 - Date: 02/18/05, Sex: Female, Weight: 3912 g, GA: [redacted]w[redacted]d, Delivery: Vaginal, Spontaneous, Apgar1: None, Apgar5: None, Living: Living, Birth Comments: None  # 2 - Date: 12/19/09, Sex: None, Weight: None, GA: None, Delivery: None, Apgar1: None, Apgar5: None, Living: Deceased, Birth Comments: None  # 3 - Date: 05/21/11, Sex: Female, Weight: 4255 g, GA: [redacted]w[redacted]d, Delivery: C-Section, Low Transverse, Apgar1: 9, Apgar5: 9, Living: Living, Birth Comments: None  # 4 - Date: 08/29/15, Sex: Female, Weight: 3895 g, GA: [redacted]w[redacted]d, Delivery: VBAC, Spontaneous, Apgar1: 9, Apgar5: 9, Living: Living, Birth Comments: None  # 5 - Date: None, Sex: None, Weight: None, GA: None, Delivery: None, Apgar1: None, Apgar5: None, Living: None, Birth Comments: None  Past Medical History:  Diagnosis Date  . AMA (advanced maternal age) multigravida 33+   . Gestational diabetes 2017   glyburide  . Herpes   . History of candidal vulvovaginitis   . History of chlamydia 2008  . Obese    Past Surgical History:  Procedure Laterality Date  . CESAREAN SECTION  05/21/2011   Procedure: CESAREAN SECTION;  Surgeon: Jonnie Kind, MD;  Location: [facility];  Service: Gynecology;  Laterality: N/A;  Primary cesarean section with delivery  of baby girl at 69. Apgars 9/9.  Marland Kitchen HERPES SIMPLEX VIRUS DFA     last outbreak prior to 2017   Family History: family history includes Diabetes in her mother; Hypertension in her brother, father, and mother. Social History:  reports that she has never smoked. She has never used smokeless tobacco. She reports that she does not drink alcohol or use drugs.    Maternal Diabetes: Yes:  Diabetes Type:  Insulin/Medication controlled Genetic Screening: Normal- NIPS low risk female  Maternal Ultrasounds/Referrals: Normal Fetal Ultrasounds or other Referrals:  None Maternal Substance Abuse:  No Significant Maternal Medications:  None Significant Maternal Lab Results:  Group B Strep negative Other Comments:  None  Review of Systems  Constitutional: Negative for chills, fatigue and fever.  Gastrointestinal: Negative for abdominal pain.  Genitourinary: Negative for difficulty urinating.  Musculoskeletal: Negative for back pain.  All other systems reviewed and are negative.    Last menstrual period 11/24/2018, unknown if currently breastfeeding. Physical Exam  Nursing note and vitals reviewed. Constitutional: She is oriented to person, place, and time. She appears well-developed and well-nourished.  Cardiovascular: Normal rate.  Respiratory: Effort normal and breath sounds normal.  GI: Soft.  Genitourinary:    No vaginal discharge.     Genitourinary Comments: No lesions visualized on SSE   Neurological: She is alert and oriented to person, place, and time.  Skin: Skin is warm and dry.  Psychiatric: She has a normal mood and affect. Her behavior is  normal. Judgment and thought content normal.    Prenatal labs: ABO, Rh: O/Positive/-- (07/30 1206) Antibody: Negative (07/30 1206) Rubella: 20.20 (07/30 1206) RPR: Non Reactive (07/30 1206)  HBsAg: Negative (07/30 1206)  HIV: Non Reactive (07/30 1206)  GBS: --Henderson Cloud (12/23 1502)   Assessment/Plan:  Morgan Ramsey is a 44 y.o.  [id number] at [redacted]w[redacted]d - IOL for GDM. Pregnancy s/f hx of HSV2 infection, obesity, anemia, hx of VBAC, AMA  Labor:  . Pain control: Labor support without medications . Anticipated MOD: TOLAC  . PPH Risk: augmented labor maternal anemia maternal obesity - high risk  . If c/s necessary, will ask about BTL as previously noted in progress notes.  Nigel Sloop presentation confirmed via [facility] at [place] today . FT/thick on admission, plan for low-dose Pitocin augmentation, assess for foley balloon placement with next exam    T2DM:  Hyperglycemia with previous labs, therefore prior to pregnancy considered T2DM. A1C elevated 8.3 on 03/13/19 (previously 2016- pre-diabetic). Home regimen is novolin Relion 17 u w/  Breakfast and 15 units QHS.   CBG q 4 hours   Clear liquid diet  HSV:  Remote from outbreak. Compliant with Valtrex. She denies any prodromal symptoms at this time. No lesions noted on exam.   Fetal Wellbeing: Cat 1 tracing . GBS --Henderson Cloud (12/23 1502)  . Continuous fetal monitoring  Language Barrier . Translator in room/on phone/on tablet     Mallie Snooks, MSN, CNM Certified Nurse Midwife, [employer] for [employer], [place] 08/17/19 12:46 PM

## 2019-08-18 ENCOUNTER — Encounter (HOSPITAL_COMMUNITY): Payer: Self-pay | Admitting: Obstetrics and Gynecology

## 2019-08-18 ENCOUNTER — Inpatient Hospital Stay (HOSPITAL_COMMUNITY): Payer: Medicaid Other | Admitting: Anesthesiology

## 2019-08-18 LAB — GLUCOSE, CAPILLARY
Glucose-Capillary: 81 mg/dL (ref 70–99)
Glucose-Capillary: 85 mg/dL (ref 70–99)
Glucose-Capillary: 96 mg/dL (ref 70–99)
Glucose-Capillary: 113 mg/dL — ABNORMAL HIGH (ref 70–99)
Glucose-Capillary: 80 mg/dL (ref 70–99)
Glucose-Capillary: 88 mg/dL (ref 70–99)

## 2019-08-18 MED ORDER — EPHEDRINE 5 MG/ML INJ: 10.0000 mg | INTRAVENOUS | Status: DC | PRN

## 2019-08-18 MED ORDER — SODIUM CHLORIDE (PF) 0.9 % IJ SOLN
INTRAMUSCULAR | Status: DC | PRN
  Administered 2019-08-18: 12 mL/h via EPIDURAL

## 2019-08-18 MED ORDER — DIPHENHYDRAMINE HCL 50 MG/ML IJ SOLN: 12.5000 mg | INTRAMUSCULAR | Status: DC | PRN

## 2019-08-18 MED ORDER — FENTANYL CITRATE (PF) 100 MCG/2ML IJ SOLN
100.0000 ug | INTRAMUSCULAR | Status: AC | PRN
  Administered 2019-08-18 (×3): 100 ug via INTRAVENOUS
  Filled 2019-08-18 (×3): qty 2

## 2019-08-18 MED ORDER — FENTANYL-BUPIVACAINE-NACL 0.5-0.125-0.9 MG/250ML-% EP SOLN
EPIDURAL | Status: AC
  Filled 2019-08-18: qty 250

## 2019-08-18 MED ORDER — PHENYLEPHRINE 40 MCG/ML (10ML) SYRINGE FOR IV PUSH (FOR BLOOD PRESSURE SUPPORT): 80.0000 ug | PREFILLED_SYRINGE | INTRAVENOUS | Status: DC | PRN

## 2019-08-18 MED ORDER — FENTANYL-BUPIVACAINE-NACL 0.5-0.125-0.9 MG/250ML-% EP SOLN: 12.0000 mL/h | EPIDURAL | Status: DC | PRN

## 2019-08-18 MED ORDER — PHENYLEPHRINE 40 MCG/ML (10ML) SYRINGE FOR IV PUSH (FOR BLOOD PRESSURE SUPPORT)
PREFILLED_SYRINGE | INTRAVENOUS | Status: AC
  Filled 2019-08-18: qty 10

## 2019-08-18 MED ORDER — LIDOCAINE HCL (PF) 1 % IJ SOLN
INTRAMUSCULAR | Status: DC | PRN
  Administered 2019-08-18: 2 mL via EPIDURAL
  Administered 2019-08-18: 10 mL via EPIDURAL

## 2019-08-18 MED ORDER — LACTATED RINGERS IV SOLN: 500.0000 mL | Freq: Once | INTRAVENOUS | Status: DC

## 2019-08-18 NOTE — Anesthesia Preprocedure Evaluation (Signed)
Anesthesia Evaluation  Patient identified by MRN, date of birth, ID band Patient awake    Reviewed: Allergy & Precautions, Patient's Chart, lab work & pertinent test results  Airway Mallampati: II  TM Distance: >3 FB Neck ROM: Full    Dental  (+) Teeth Intact, Dental Advisory Given   Pulmonary neg pulmonary ROS,    Pulmonary exam normal breath sounds clear to auscultation       Cardiovascular hypertension, Normal cardiovascular exam Rhythm:Regular Rate:Normal     Neuro/Psych negative neurological ROS     GI/Hepatic negative GI ROS, Neg liver ROS,   Endo/Other  diabetes, Oral Hypoglycemic Agents, Insulin DependentMorbid obesityBMI 41  Renal/GU negative Renal ROS     Musculoskeletal negative musculoskeletal ROS (+)   Abdominal   Peds  Hematology  (+) anemia , hct 36.5, plt 243   Anesthesia Other Findings   Reproductive/Obstetrics (+) Pregnancy AMA, prior VBAC                             Anesthesia Physical  Anesthesia Plan  ASA: III and emergent  Anesthesia Plan: Epidural   Post-op Pain Management:    Induction:   PONV Risk Score and Plan:   Airway Management Planned: Natural Airway  Additional Equipment: None  Intra-op Plan:   Post-operative Plan:   Informed Consent: I have reviewed the patients History and Physical, chart, labs and discussed the procedure including the risks, benefits and alternatives for the proposed anesthesia with the patient or authorized representative who has indicated his/her understanding and acceptance.       Plan Discussed with:   Anesthesia Plan Comments:         Anesthesia Quick Evaluation

## 2019-08-18 NOTE — Progress Notes (Signed)
Labor Progress Note Morgan Ramsey is a 44 y.o. [id number] at [redacted]w[redacted]d presented for IOL for A2GDM.  S:  Some discomfort with ctx but comfortable after Fentanyl.  O:  BP 125/73   Pulse 80   Temp 98 F (36.7 C)   Resp 16   Ht 5\' 3"  (1.6 m)   Wt 103.7 kg   LMP 11/24/2018 (Exact Date)   BMI 40.49 kg/m   Fetal Tracing:  Baseline: 155-160 Variability: moderate Accels: 15x15 Decels: small variables but infrequent  Toco: infrequent    CVE: Dilation: 2 Effacement (%): 50 Cervical Position: Posterior Station: -3 Presentation: Vertex Exam by:: Sharell Hilmer MD   A&P: 44 y.o. [id number] [redacted]w[redacted]d IOL A2GDM. #Labor: TOLAC. Hx of VD then CS then VBAC. Pit currently at 8. S/p Foley bulb and will titrate Pit to max 30 now. AROM with clear fluid at this exam and IUPC placed posteriorly. Anticipate VD; CS as appropriate. Plan for AROM when FB out. #Pain: per patient request #FWB: Cat II, reassuring for moderate variability and accels  #GBS negative  #GDMA2: last glucose 96  Chauncey Mann, MD 4:28 PM

## 2019-08-18 NOTE — Progress Notes (Signed)
Labor Progress Note Mylasia Hedman is a 44 y.o. [id number] at [redacted]w[redacted]d presented for IOL for A2GDM  S:  Patient sleeping through contractions.  O:  BP 121/70   Pulse 93   Temp 98.6 F (37 C)   Resp 16   Ht 5\' 3"  (1.6 m)   Wt 103.7 kg   LMP 11/24/2018 (Exact Date)   BMI 40.49 kg/m   Fetal Tracing:  Baseline: 135 Variability: moderate Accels: 15x15 Decels: none  Toco: 3-4   CVE: Dilation: 1.5 Effacement (%): 50 Cervical Position: Posterior Station: -3 Presentation: Vertex Exam by:: Lenise Herald RN    A&P: 44 y.o. [id number] [redacted]w[redacted]d IOL A2GDM #Labor: Some change in dilation. Cervix still very posterior, unable to place FB. Will continue to titrate pitocin #Pain: per patient request #FWB: Cat 1 #GBS negative  Wende Mott, CNM 4:33 AM

## 2019-08-18 NOTE — Progress Notes (Addendum)
Morgan Ramsey is a 44 y.o. [id number] at [redacted]w[redacted]d presented for IOL for A2GDM.  Subjective: Feeling contractions and feeling uncomfortable. Asking for IV pain meds.   Objective: BP 137/81   Pulse 80   Temp 98 F (36.7 C) (Oral)   Resp 17   Ht 5\' 3"  (1.6 m)   Wt 103.7 kg   LMP 11/24/2018 (Exact Date)   BMI 40.49 kg/m  No intake/output data recorded. No intake/output data recorded.  FHT:  FHR: 140 bpm, variability: moderate,  accelerations:  Present,  decelerations:  Present early and variables UC:   regular, every 3-4 minutes SVE:   Dilation: 5 Effacement (%): 70 Station: -3 Exam by:: Morgan Ramsey   Labs: Lab Results  Component Value Date   WBC 8.7 08/17/2019   HGB 11.5 (L) 08/17/2019   HCT 36.5 08/17/2019   MCV 73.1 (L) 08/17/2019   PLT 243 08/17/2019    Assessment / Plan: Induction of labor due to gestational diabetes,  progressing well on pitocin, TOLAC  Labor: S/p FB and AROM. Pitocin at 18. Continue to titrate Fetal Wellbeing:  Category I Pain Control:  Epidural I/D:  n/a Anticipated MOD:  vaginal delivery  Morgan Ramsey 08/18/2019, 2130  GME ATTESTATION:  I saw and evaluated the patient. I agree with the findings and the plan of care as documented in the resident's note.  Morgan Baba, DO OB Fellow [place] for [employer]

## 2019-08-18 NOTE — Progress Notes (Signed)
Labor Progress Note Morgan Ramsey is a 44 y.o. [id number] at [redacted]w[redacted]d presented for IOL for A2GDM.  S:  Some discomfort with ctx.   O:  BP (!) 120/58   Pulse 87   Temp 98.6 F (37 C)   Resp 16   Ht 5\' 3"  (1.6 m)   Wt 103.7 kg   LMP 11/24/2018 (Exact Date)   BMI 40.49 kg/m   Fetal Tracing:  Baseline: 155-160 Variability: moderate Accels: 15x15 Decels: none  Toco: infrequent    CVE: Dilation: 2 Effacement (%): 50 Cervical Position: Posterior Station: -3 Presentation: Vertex Exam by:: Maevis Mumby MD   A&P: 44 y.o. [id number] [redacted]w[redacted]d IOL A2GDM. #Labor: TOLAC. Hx of VD then CS then VBAC. Pit currently at 4 after Pit break for eating. Will keep max of 6 while FB in place then titrate accordingly. Anticipate VD; CS as appropriate. Plan for AROM when FB out. #Pain: per patient request #FWB: Cat 1, EFW 3900g #GBS negative  #GDMA2: last glucose 96  Chauncey Mann, MD 1:51 PM

## 2019-08-18 NOTE — Progress Notes (Signed)
[place] interpreter at bedside. POC explained to patient. Pt's questions answered.

## 2019-08-18 NOTE — Progress Notes (Signed)
Labor Progress Note Morgan Ramsey is a 44 y.o. [id number] at [redacted]w[redacted]d presented for IOL for A2GDM.  S:  Starting to feel ctx more and fairly uncomfortable.   O:  BP 125/69   Pulse 81   Temp 98.6 F (37 C)   Resp 16   Ht 5\' 3"  (1.6 m)   Wt 103.7 kg   LMP 11/24/2018 (Exact Date)   BMI 40.49 kg/m   Fetal Tracing:  Baseline: 135 Variability: moderate Accels: 15x15 Decels: none  Toco: q70m   CVE: Dilation: 2 Effacement (%): 50 Cervical Position: Posterior Station: -3 Presentation: Vertex Exam by:: Sadeel Fiddler MD   A&P: 44 y.o. [id number] [redacted]w[redacted]d IOL A2GDM. #Labor: TOLAC. Hx of VD then CS then VBAC. Pit currently at 33. Foley bulb still in place. Pit turned off. Will allow patient to eat as she has not eaten since 1/2 and then restart Pit at 2 with max of 6 while FB in place. Anticipate VD; CS as appropriate. Plan for AROM when FB out. #Pain: per patient request #FWB: Cat 1, EFW 3900g #GBS negative  #GDMA2: last glucose 85  Chauncey Mann, MD 9:58 AM

## 2019-08-18 NOTE — Progress Notes (Signed)
Labor Progress Note Aritza Deutschman is a 44 y.o. [id number] at [redacted]w[redacted]d presented for IOL for A2GDM.  S:  Feeling more uncomfortable with intermittent pressure.   O:  BP 127/75   Pulse 81   Temp 98.6 F (37 C) (Oral)   Resp 16   Ht 5\' 3"  (1.6 m)   Wt 103.7 kg   LMP 11/24/2018 (Exact Date)   BMI 40.49 kg/m   Fetal Tracing:  Baseline: 135 Variability: moderate Accels: 15x15 Decels: small variables but infrequent  Toco: q3-70m   CVE: Dilation: 4 Effacement (%): 50 Cervical Position: Middle Station: -2 Presentation: Vertex Exam by:: Dr. Marice Potter   A&P: 44 y.o. [id number] [redacted]w[redacted]d IOL A2GDM. #Labor: TOLAC. Hx of VD then CS then VBAC. Pit currently at 8. S/p Foley bulb, AROM and IUPC. Cont Pit titration. Repeat BSUS vertex at this time as station felt higher than previously. Anticipate VD; CS as appropriate.  #Pain: per patient request #FWB: Cat II, reassuring for moderate variability and accels  #GBS negative  #GDMA2: last glucose 113  Chauncey Mann, MD 7:19 PM

## 2019-08-18 NOTE — Anesthesia Procedure Notes (Signed)
Epidural Patient location during procedure: OB Start time: 08/18/2019 10:55 PM End time: 08/18/2019 11:07 PM  Staffing Anesthesiologist: Pervis Hocking, DO Performed: anesthesiologist   Preanesthetic Checklist Completed: patient identified, IV checked, risks and benefits discussed, monitors and equipment checked, pre-op evaluation and timeout performed  Epidural Patient position: sitting Prep: DuraPrep and site prepped and draped Patient monitoring: continuous pulse ox, blood pressure, heart rate and cardiac monitor Approach: midline Location: L3-L4 Injection technique: LOR air  Needle:  Needle type: Tuohy  Needle gauge: 17 G Needle length: 9 cm Needle insertion depth: 6 cm Catheter type: closed end flexible Catheter size: 19 Gauge Catheter at skin depth: 11 cm Test dose: negative  Assessment Sensory level: T8 Events: blood not aspirated, injection not painful, no injection resistance, no paresthesia and negative IV test  Additional Notes Patient identified. Risks/Benefits/Options discussed with patient including but not limited to bleeding, infection, nerve damage, paralysis, failed block, incomplete pain control, headache, blood pressure changes, nausea, vomiting, reactions to medication both or allergic, itching and postpartum back pain. Confirmed with bedside nurse the patient's most recent platelet count. Confirmed with patient that they are not currently taking any anticoagulation, have any bleeding history or any family history of bleeding disorders. Patient expressed understanding and wished to proceed. All questions were answered. Sterile technique was used throughout the entire procedure. Please see nursing notes for vital signs. Test dose was given through epidural catheter and negative prior to continuing to dose epidural or start infusion. Warning signs of high block given to the patient including shortness of breath, tingling/numbness in hands, complete motor  block, or any concerning symptoms with instructions to call for help. Patient was given instructions on fall risk and not to get out of bed. All questions and concerns addressed with instructions to call with any issues or inadequate analgesia.  Reason for block:procedure for pain

## 2019-08-18 NOTE — Progress Notes (Signed)
Labor Progress Note Morgan Ramsey is a 44 y.o. [id number] at [redacted]w[redacted]d presented for IOL for A2GDM.  S:  Overall comfortable; minor cramping.  O:  BP 125/69   Pulse 81   Temp 98.6 F (37 C)   Resp 16   Ht 5\' 3"  (1.6 m)   Wt 103.7 kg   LMP 11/24/2018 (Exact Date)   BMI 40.49 kg/m   Fetal Tracing:  Baseline: 135 Variability: moderate Accels: 15x15 Decels: none  Toco: q71m   CVE: Dilation: 2 Effacement (%): 50 Cervical Position: Posterior Station: -3 Presentation: Vertex Exam by:: Lorenso Courier   A&P: 44 y.o. [id number] [redacted]w[redacted]d IOL A2GDM. #Labor: TOLAC. Hx of VD then CS then VBAC. Pit currently at 66, will not increase until FB out. Foley bulb placed at this exam with speculum. Anticipate VD; CS as appropriate. Plan for AROM when FB out. #Pain: per patient request #FWB: Cat 1, EFW 3900g #GBS negative  #GDMA2: last glucose 85  Chauncey Mann, MD 9:18 AM

## 2019-08-19 ENCOUNTER — Encounter (HOSPITAL_COMMUNITY): Payer: Self-pay | Admitting: Obstetrics and Gynecology

## 2019-08-19 DIAGNOSIS — O9852 Other viral diseases complicating childbirth: Secondary | ICD-10-CM

## 2019-08-19 DIAGNOSIS — O403XX0 Polyhydramnios, third trimester, not applicable or unspecified: Secondary | ICD-10-CM

## 2019-08-19 DIAGNOSIS — Z3A38 38 weeks gestation of pregnancy: Secondary | ICD-10-CM

## 2019-08-19 DIAGNOSIS — O24424 Gestational diabetes mellitus in childbirth, insulin controlled: Secondary | ICD-10-CM

## 2019-08-19 LAB — GLUCOSE, CAPILLARY
Glucose-Capillary: 94 mg/dL (ref 70–99)
Glucose-Capillary: 130 mg/dL — ABNORMAL HIGH (ref 70–99)

## 2019-08-19 LAB — RPR: RPR Ser Ql: NONREACTIVE

## 2019-08-19 MED ORDER — TRANEXAMIC ACID-NACL 1000-0.7 MG/100ML-% IV SOLN: 1000.0000 mg | INTRAVENOUS | Status: AC

## 2019-08-19 MED ORDER — ACETAMINOPHEN 325 MG PO TABS
650.0000 mg | ORAL_TABLET | ORAL | Status: DC | PRN
  Administered 2019-08-19 – 2019-08-21 (×2): 650 mg via ORAL
  Filled 2019-08-19 (×2): qty 2

## 2019-08-19 MED ORDER — SIMETHICONE 80 MG PO CHEW: 80.0000 mg | CHEWABLE_TABLET | ORAL | Status: DC | PRN

## 2019-08-19 MED ORDER — TETANUS-DIPHTH-ACELL PERTUSSIS 5-2.5-18.5 LF-MCG/0.5 IM SUSP: 0.5000 mL | Freq: Once | INTRAMUSCULAR | Status: DC

## 2019-08-19 MED ORDER — BENZOCAINE-MENTHOL 20-0.5 % EX AERO
1.0000 "application " | INHALATION_SPRAY | CUTANEOUS | Status: DC | PRN
  Filled 2019-08-19: qty 56

## 2019-08-19 MED ORDER — OXYCODONE HCL 5 MG PO TABS: 10.0000 mg | ORAL_TABLET | ORAL | Status: DC | PRN

## 2019-08-19 MED ORDER — ZOLPIDEM TARTRATE 5 MG PO TABS: 5.0000 mg | ORAL_TABLET | Freq: Every evening | ORAL | Status: DC | PRN

## 2019-08-19 MED ORDER — ONDANSETRON HCL 4 MG PO TABS: 4.0000 mg | ORAL_TABLET | ORAL | Status: DC | PRN

## 2019-08-19 MED ORDER — WITCH HAZEL-GLYCERIN EX PADS: 1.0000 "application " | MEDICATED_PAD | CUTANEOUS | Status: DC | PRN

## 2019-08-19 MED ORDER — ONDANSETRON HCL 4 MG/2ML IJ SOLN: 4.0000 mg | INTRAMUSCULAR | Status: DC | PRN

## 2019-08-19 MED ORDER — PRENATAL MULTIVITAMIN CH
1.0000 | ORAL_TABLET | Freq: Every day | ORAL | Status: DC
  Administered 2019-08-19 – 2019-08-21 (×3): 1 via ORAL
  Filled 2019-08-19 (×3): qty 1

## 2019-08-19 MED ORDER — OXYCODONE HCL 5 MG PO TABS: 5.0000 mg | ORAL_TABLET | ORAL | Status: DC | PRN

## 2019-08-19 MED ORDER — POLYETHYLENE GLYCOL 3350 17 G PO PACK
17.0000 g | PACK | Freq: Two times a day (BID) | ORAL | Status: DC
  Administered 2019-08-19 – 2019-08-21 (×3): 17 g via ORAL
  Filled 2019-08-19 (×4): qty 1

## 2019-08-19 MED ORDER — DIBUCAINE (PERIANAL) 1 % EX OINT: 1.0000 "application " | TOPICAL_OINTMENT | CUTANEOUS | Status: DC | PRN

## 2019-08-19 MED ORDER — DIPHENHYDRAMINE HCL 25 MG PO CAPS: 25.0000 mg | ORAL_CAPSULE | Freq: Four times a day (QID) | ORAL | Status: DC | PRN

## 2019-08-19 MED ORDER — COCONUT OIL OIL: 1.0000 "application " | TOPICAL_OIL | Status: DC | PRN

## 2019-08-19 MED ORDER — SENNOSIDES-DOCUSATE SODIUM 8.6-50 MG PO TABS
1.0000 | ORAL_TABLET | Freq: Two times a day (BID) | ORAL | Status: DC
  Administered 2019-08-19 – 2019-08-21 (×4): 1 via ORAL
  Filled 2019-08-19 (×5): qty 1

## 2019-08-19 MED ORDER — TRANEXAMIC ACID-NACL 1000-0.7 MG/100ML-% IV SOLN
INTRAVENOUS | Status: AC
  Administered 2019-08-19: 1000 mg via INTRAVENOUS
  Filled 2019-08-19: qty 100

## 2019-08-19 MED ORDER — IBUPROFEN 600 MG PO TABS
600.0000 mg | ORAL_TABLET | Freq: Four times a day (QID) | ORAL | Status: DC
  Administered 2019-08-19 – 2019-08-21 (×7): 600 mg via ORAL
  Filled 2019-08-19 (×8): qty 1

## 2019-08-19 NOTE — Progress Notes (Addendum)
Leiyah Hedgepath is a 44 y.o. [id number] at [redacted]w[redacted]d presented for IOL for A2GDM.  Subjective: Comfortable with epidural.   Objective: BP 115/68   Pulse 90   Temp 97.8 F (36.6 C) (Oral)   Resp 17   Ht 5\' 3"  (1.6 m)   Wt 103.7 kg   LMP 11/24/2018 (Exact Date)   BMI 40.49 kg/m  No intake/output data recorded. No intake/output data recorded.  FHT:  FHR: 140 bpm, variability: moderate,  accelerations:  Present,  decelerations:  Present early and variables UC:   regular, every 3-4 minutes SVE:   Dilation: 5.5 Effacement (%): 80 Station: -3 Exam by:: Glenice Bow, rnc   Labs: Lab Results  Component Value Date   WBC 8.7 08/17/2019   HGB 11.5 (L) 08/17/2019   HCT 36.5 08/17/2019   MCV 73.1 (L) 08/17/2019   PLT 243 08/17/2019    Assessment / Plan: Induction of labor due to gestational diabetes,  progressing well on pitocin,  TOLAC  Labor: S/p FB and AROM. Pitocin at 22. Continue to titrate 2x2  Fetal Wellbeing:  Category I Pain Control:  Epidural I/D:  n/a Anticipated MOD:  vaginal delivery  Wilber Oliphant 08/19/2019, 1:16 AM  GME ATTESTATION:  I saw and evaluated the patient. I agree with the findings and the plan of care as documented in the resident's note.  Merilyn Baba, DO OB Fellow, [place] [facility] [place] 08/19/2019 2:12 AM

## 2019-08-19 NOTE — Lactation Note (Signed)
This note was copied from a baby's chart. Lactation Consultation Note  Patient Name: Morgan Ramsey [id number] Date: 08/19/2019 Reason for consult: Initial assessment   P4, 7 hours old.  Mother is Ex BF for 1 year & 105mos with daughters and 3 years with son.  Video interpretation used for Spanish.  Mother would like to breastfeed and formula feed. Mother states she is concerned about her supply. Discussed supply and demand.  Encouraged mother to breastfeed before offering formula. Mother latched baby in cradle hold with flanged lips. Intermittent swallows observed. Feed on demand with cues.  Goal 8-12+ times per day after first 24 hrs.  Place baby STS if not cueing.  Mom made aware of O/P services, breastfeeding support groups, community resources, and our phone # for post-discharge questions.     Maternal Data Has patient been taught Hand Expression?: Yes Does the patient have breastfeeding experience prior to this delivery?: Yes  Feeding Feeding Type: Breast Fed Nipple Type: Extra Slow Flow  LATCH Score Latch: Grasps breast easily, tongue down, lips flanged, rhythmical sucking.  Audible Swallowing: A few with stimulation  Type of Nipple: Everted at rest and after stimulation  Comfort (Breast/Nipple): Soft / non-tender  Hold (Positioning): Assistance needed to correctly position infant at breast and maintain latch.  LATCH Score: 8  Interventions Interventions: Breast feeding basics reviewed;Assisted with latch;Hand express  Lactation Tools Discussed/Used     Consult Status Consult Status: Follow-up Date: 08/20/19 Follow-up type: In-patient    Vivianne Master [facility] 08/19/2019, 1:09 PM

## 2019-08-19 NOTE — Anesthesia Postprocedure Evaluation (Signed)
Anesthesia Post Note  Patient: Morgan Ramsey  Procedure(s) Performed: AN AD HOC LABOR EPIDURAL     Patient location during evaluation: Mother Baby Anesthesia Type: Epidural Level of consciousness: awake and alert Pain management: pain level controlled Vital Signs Assessment: post-procedure vital signs reviewed and stable Respiratory status: spontaneous breathing, nonlabored ventilation and respiratory function stable Cardiovascular status: stable Postop Assessment: no headache, no backache and epidural receding Anesthetic complications: no    Last Vitals:  Vitals:   08/19/19 0928 08/19/19 1135  BP: 135/80   Pulse: 95   Resp: 18   Temp: (!) 38.1 C 37.8 C  SpO2: 100%     Last Pain:  Vitals:   08/19/19 1135  TempSrc: Oral  PainSc:    Pain Goal:                   [name]

## 2019-08-19 NOTE — Discharge Instructions (Signed)
Parto vaginal, cuidados de puerperio Postpartum Care After Vaginal Delivery Lea esta informacin sobre cmo cuidarse desde el momento en que nazca su beb y [place] Boga 6 a 12 semanas despus del parto (perodo del posparto). El mdico tambin podr darle instrucciones ms especficas. Comunquese con su mdico si tiene problemas o preguntas. Siga estas indicaciones en su casa: Hemorragia vaginal  Es normal tener un poco de hemorragia vaginal (loquios) despus del parto. Use un apsito sanitario para el sangrado vaginal y secrecin. ? Durante la primera semana despus del parto, la cantidad y el aspecto de los loquios a menudo es similar a las del perodo menstrual. ? Durante las siguientes semanas disminuir gradualmente hasta convertirse en una secrecin seca amarronada o [place]. ? En la [employer], los loquios se detienen [employer] 4 a 6semanas despus del [place]. Los sangrados vaginales pueden variar de mujer a Art therapist.  Cambie los apsitos sanitarios con frecuencia. Observe si hay cambios en el flujo, como: ? Un aumento repentino en el volumen. ? Cambio en el color. ? Cogulos sanguneos grandes.  Si expulsa un cogulo de sangre por la vagina, gurdelo y llame al mdico para informrselo. No deseche los cogulos de sangre por el inodoro antes de hablar con su mdico.  No use tampones ni se haga duchas vaginales hasta que el mdico la autorice.  Si no est amamantando, volver a tener su perodo entre 6 y 34 semanas despus del parto. Si solamente alimenta al beb con Steva Colder materna (lactancia materna exclusiva), podra no volver a tener su perodo hasta que deje de [place]. Cuidados perineales  Mantenga la zona entre la vagina y el ano (perineo) limpia y [place], [place] se lo haya indicado el mdico. Utilice apsitos o aerosoles analgsicos y Proofreader, como se lo hayan indicado.  Si le hicieron un corte en el perineo (episiotoma) o tuvo un desgarro en la vagina, controle la  zona para detectar signos de infeccin hasta que sane. Est atenta a los siguientes signos: ? Aumento del enrojecimiento, la hinchazn o Conservation officer, historic buildings. ? Presenta lquido o sangre que supura del corte o Tree surgeon. ? Calor. ? Pus o mal olor.  Es posible que le den una botella rociadora para que use en lugar de limpiarse el rea con papel higinico despus de usar el bao. Cuando comience a Scientist, research (medical), podr usar la botella rociadora antes de secarse. Asegrese de secarse suavemente.  Para aliviar el dolor causado por una episiotoma, un desgarro en la vagina o venas hinchadas en el ano (hemorroides), trate de tomar un bao de asiento tibio 2 o 3 veces por da. Un bao de asiento es un bao de agua tibia que se toma mientras se est sentado. El agua solo debe Systems analyst las caderas y cubrir las nalgas. [place] despus del parto, las mamas pueden sentirse pesadas, llenas e incmodas (congestin [place]). Tambin puede escaparse leche de sus senos. El mdico puede sugerirle mtodos para Gaffer. La congestin mamaria debera desaparecer al cabo de [employer].  Si est amamantando: ? Use un sostn que sujete y ajuste bien sus pechos. ? Mantenga los pezones secos y limpios. Aplquese cremas y ungentos, como se lo haya indicado el mdico. ? Es posible que deba usar discos de algodn en el sostn para Tax adviser la [place] que se filtre de sus senos. ? Puede tener contracciones uterinas cada vez que amamante durante varias semanas despus del [place]. Las contracciones uterinas ayudan al tero a  regresar a su tamao habitual. ? Si tiene algn problema con la lactancia materna, colabore con el mdico o un Lobbyist.  Si no est amamantando: ? Evite tocarse [employer]. Al hacerlo, podran producir ms leche. ? Use un sostn que le proporcione el ajuste correcto y compresas fras para reducir la hinchazn. ? No extraiga (saque) [employer]. Esto har que  produzca ms [employer]. Intimidad y sexualidad  Pregntele al mdico cundo puede retomar la actividad sexual. Esto puede depender de lo siguiente: ? Su riesgo de sufrir infecciones. ? La rapidez con la que est sanando. ? Su comodidad y deseo de retomar la actividad sexual.  Despus del parto, puede quedar embarazada incluso si no ha tenido todava su perodo. Si lo desea, hable con el mdico acerca de los mtodos de control de la natalidad (mtodos anticonceptivos). Medicamentos  [employer] de venta libre y los recetados solamente como se lo haya indicado el mdico.  Si le recetaron un antibitico, tmelo como se lo haya indicado el mdico. No deje de tomar el antibitico aunque comience a sentirse mejor. Actividad  Retome sus actividades normales de a poco como se lo haya indicado el mdico. Pregntele al mdico qu actividades son seguras para usted.  Descanse todo lo que pueda. Trate de descansar o tomar una siesta mientras el beb duerme. Comida y bebida   Beba suficiente lquido como para Theatre manager la orina de color amarillo plido.  Coma alimentos ricos en [employer]. Estos pueden ayudarla a prevenir o Cytogeneticist. Los alimentos ricos en fibras incluyen, entre otros: ? Panes y cereales integrales. ? Arroz integral. ? Optometrist. ? Lambert Mody y verduras frescas.  No intente perder de peso rpidamente reduciendo el consumo de caloras.  Tome sus vitaminas prenatales hasta la visita de seguimiento de posparto o hasta que su mdico le indique que puede dejar de tomarlas. Estilo de vida  No consuma ningn producto que contenga nicotina o tabaco, como cigarrillos y Psychologist, sport and exercise. Si necesita ayuda para dejar de fumar, consulte al mdico.  No beba alcohol, especialmente si est amamantando. Instrucciones generales  Concurra a todas las visitas de seguimiento para usted y el beb, como se lo haya indicado el mdico. La mayora de las mujeres  visita al mdico para un seguimiento de posparto dentro de las primeras 3 a 6 semanas despus del parto. Comunquese con un mdico si:  Se siente incapaz de controlar los cambios que implica tener un hijo y esos sentimientos no desaparecen.  Siente tristeza o preocupacin de forma [place] mamas se ponen rojas, le duelen o se endurecen.  Tiene fiebre.  Tiene dificultad para retener la [place] o para impedir que la orina se escape.  Tiene poco inters o falta de inters en actividades que solan gustarle.  No ha amamantado nada y no ha tenido un perodo menstrual durante 12 semanas despus del [place].  Dej de amamantar al beb y no ha tenido su perodo menstrual durante 12 semanas despus de dejar de Economist.  Tiene preguntas sobre su cuidado y el del beb.  Elimina un cogulo de sangre grande por la vagina. Solicite ayuda de inmediato si:  Electronics engineer.  Tiene dificultad para respirar.  Tiene un dolor repentino e intenso en la pierna.  Tiene dolor intenso o clicos en el la parte inferior del abdomen.  Tiene una hemorragia tan intensa de la vagina que empapa ms de un apsito en [employer]. El sangrado  no debe ser ms abundante que el perodo ms intenso que haya tenido.  Dolor de cabeza intenso.  Se desmaya.  Tiene visin borrosa o manchas en la vista.  Tiene secrecin vaginal con mal olor.  Tiene pensamientos acerca de lastimarse a usted misma o a su beb. Si alguna vez siente que puede lastimarse a usted misma o a otras personas, o tiene pensamientos de poner fin a su vida, busque ayuda de inmediato. Puede dirigirse al departamento de emergencias ms cercano o llamar a:  El servicio de emergencias de su localidad (911 en [place].).  Una lnea de asistencia al suicida y atencin en crisis, como la Lnea Nacional de Prevencin del Suicidio (National Suicide Prevention Lifeline), al 1-800-273-8255. Est disponible las 24 horas del da. Resumen  El  perodo de tiempo justo despus el parto y hasta 6 a 12 semanas despus del parto se denomina perodo posparto.  Retome sus actividades normales de a poco como se lo haya indicado el mdico.  Concurra a todas las visitas de seguimiento para usted y el beb, como se lo haya indicado el mdico. Esta informacin no tiene como fin reemplazar el consejo del mdico. Asegrese de hacerle al mdico cualquier pregunta que tenga. Document Revised: 11/10/2017 Document Reviewed: 07/22/2017 Elsevier Patient Education  2020 Elsevier Inc.  

## 2019-08-19 NOTE — Discharge Summary (Addendum)
Patient's discharge was delayed to 08/21/19 because baby was not able to go home.    Postpartum Discharge Summary      Patient Name: Morgan Ramsey DOB: Jun 30, 1976 MRN: [id number]  Date of admission: 08/17/2019 Delivering Provider: Merilyn Baba   Date of discharge: 08/22/2019  Admitting diagnosis: Polyhydramnios, antepartum, single or unspecified fetus [O40.9XX0] Intrauterine pregnancy: [redacted]w[redacted]d    Secondary diagnosis:  Active Problems:   Language barrier   Previous cesarean section   Obesity in pregnancy   History of VBAC   Advanced maternal age in multigravida   Polyhydramnios, antepartum complication  Additional problems: T2DM     Discharge diagnosis: VBAC                                                                                                Post partum procedures:None  Augmentation: AROM, Pitocin and Foley Balloon  Complications: Vacuum-assisted vaginal delivery, 3rd degree laceration  Hospital course:  Induction of Labor With Vaginal Delivery   44y.o. yo [id number] admitted to the hospital 08/17/2019 for induction of labor.  Indication for induction: TYPE 2 DM.  Patient had an uncomplicated labor course as follows:  Her induction was started with FB and pitocin. She was AROMed on Day#2 of induction. She progressed on pitocin and delivered on Day#3 of induction.  Membrane Rupture Time/Date: 4:26 PM ,08/18/2019   Intrapartum Procedures: Episiotomy: None [1]                                         Lacerations:  Perineal [11];3rd degree [4]  Patient had delivery of a Viable infant.  Information for the patient's newborn:  CaDarra Lisirl [id number]]Delivery Method: VBAC, Vacuum Assisted(Filed from Delivery Summary)    08/19/2019  Details of delivery can be found in separate delivery note.  Patient had a routine postpartum course. Patient is discharged home 08/21/19. Delivery time: 5:54 AM    Magnesium Sulfate received: No BMZ  received: No Rhophylac:N/A MMR:N/A Transfusion:No  Physical exam  Vitals:   08/20/19 1508 08/20/19 1940 08/20/19 2246 08/21/19 0531  BP: 116/70 108/75 121/77 109/74  Pulse: 76 73 72 72  Resp: _0 Temp: 98.4 F (36.9 C) 98.6 F (37 C)  98.5 F (36.9 C)  TempSrc: Oral Oral  Oral  SpO2: 98% 100% 100% 100%  Weight:      Height:       General: alert, cooperative and no distress Lochia: appropriate Uterine Fundus: firm Incision: N/A DVT Evaluation: No evidence of DVT seen on physical exam. Labs: Lab Results  Component Value Date   WBC 8.6 08/20/2019   HGB 9.2 (L) 08/20/2019   HCT 29.2 (L) 08/20/2019   MCV 72.6 (L) 08/20/2019   PLT 187 08/20/2019   CMP Latest Ref Rng & Units 03/13/2019  Glucose 65 - 99 mg/dL 161(H)  BUN 6 - 24 mg/dL 8  Creatinine 0.57 - 1.00 mg/dL 0.55(L)  Sodium 134 - 144 mmol/L 136  Potassium  3.5 - 5.2 mmol/L 4.0  Chloride 96 - 106 mmol/L 100  CO2 20 - 29 mmol/L 20  Calcium 8.7 - 10.2 mg/dL 8.8  Total Protein 6.0 - 8.5 g/dL 6.8  Total Bilirubin 0.0 - 1.2 mg/dL <0.2  Alkaline Phos 39 - 117 IU/L 92  AST 0 - 40 IU/L 12  ALT 0 - 32 IU/L 7    Discharge instruction: per After Visit Summary and "Baby and Me Booklet".  After visit meds:  Allergies as of 08/21/2019   No Known Allergies     Medication List    STOP taking these medications   aspirin EC 81 MG tablet     TAKE these medications   acetaminophen 500 MG tablet Commonly known as: TYLENOL Take 1,000 mg by mouth every 6 (six) hours as needed for mild pain.   ferrous sulfate 325 (65 FE) MG tablet Take 325 mg by mouth daily with breakfast.   ibuprofen 600 MG tablet Commonly known as: ADVIL Take 1 tablet (600 mg total) by mouth every 6 (six) hours.   insulin NPH Human 100 UNIT/ML injection Commonly known as: NovoLIN N ReliOn Relion Brand- 17 units with breakfast and 15 units before bed   Insulin Syringe-Needle U-100 30G X 5/16" 0.5 ML Misc 1 Device by Does not apply route 2  (two) times daily.   oxyCODONE 5 MG immediate release tablet Commonly known as: Oxy IR/ROXICODONE Take 1 tablet (5 mg total) by mouth every 4 (four) hours as needed (pain scale 4-7).   polyethylene glycol 17 g packet Commonly known as: MIRALAX / GLYCOLAX Take 17 g by mouth 2 (two) times daily.   prenatal multivitamin Tabs tablet Take 1 tablet by mouth daily at 12 noon.   senna-docusate 8.6-50 MG tablet Commonly known as: Senokot-S Take 1 tablet by mouth every 12 (twelve) hours.   valACYclovir 1000 MG tablet Commonly known as: VALTREX Take 1 tablet (1,000 mg total) by mouth daily.       Diet: carb modified diet  Activity: Advance as tolerated. Pelvic rest for 6 weeks.   Outpatient follow up:4 weeks Follow up Appt: Future Appointments  Date Time Provider [place]  08/28/2019  1:30 PM [place]  09/18/2019  8:15 AM Sloan Leiter, MD [facility]   Follow up Visit: Please schedule this patient for Postpartum visit in: 4 weeks with the following provider: MD For C/S patients schedule nurse incision check in weeks 2 weeks: no High risk pregnancy complicated by: E3XI, AMA, H/o CS Delivery mode:  Vacuum Anticipated Birth Control:  Depo at HD PP Procedures needed: visit in 7-10 days to check on 3a perineal repair  Schedule Integrated [place] visit: no  Newborn Data: Live born female  Birth Weight:  4035 g APGAR: 68, 9  Newborn Delivery   Birth date/time: 08/19/2019 05:54:00 Delivery type: VBAC, Vacuum Assisted      Baby Feeding: Breast Disposition:home with mother   08/21/2019 Merilyn Baba, DO

## 2019-08-19 NOTE — Progress Notes (Signed)
Received call from RN Ebonie that patient had fever at 938 to 100.6*F, received acetaminophen, repeat at 1138 was 100*F, most recently 98.7*F at 1415. Patient had isolated maternal fever intrapartum of 101*F at 459 and then 100.9*F at 630. She delivered vaginally, GBS negative. On exam, she is resting comfortably, NAD, firm fundus with some tenderness, not exquisitely tender beyond expected postpartum.  Patient has no other risk factors, no leukocytosis. Will continue to monitor for isolated maternal fever, no abx at this time.  Gladys Damme, MD [place] Resident, PGY-1

## 2019-08-19 NOTE — Plan of Care (Signed)
  Problem: Education: Goal: Knowledge of Childbirth will improve Outcome: Adequate for Discharge Goal: Ability to make informed decisions regarding treatment and plan of care will improve Outcome: Adequate for Discharge Goal: Ability to state and carry out methods to decrease the pain will improve Outcome: Adequate for Discharge Goal: Individualized Educational Video(s) Outcome: Adequate for Discharge   Problem: Coping: Goal: Ability to verbalize concerns and feelings about labor and delivery will improve Outcome: Adequate for Discharge   Problem: Life Cycle: Goal: Ability to make normal progression through stages of labor will improve Outcome: Adequate for Discharge Goal: Ability to effectively push during vaginal delivery will improve Outcome: Adequate for Discharge   Problem: Role Relationship: Goal: Will demonstrate positive interactions with the child Outcome: Adequate for Discharge   Problem: Safety: Goal: Risk of complications during labor and delivery will decrease Outcome: Adequate for Discharge   Problem: Pain Management: Goal: Relief or control of pain from uterine contractions will improve Outcome: Adequate for Discharge   

## 2019-08-20 ENCOUNTER — Encounter: Payer: Self-pay | Admitting: Obstetrics & Gynecology

## 2019-08-20 ENCOUNTER — Other Ambulatory Visit: Payer: Self-pay

## 2019-08-20 LAB — CBC
HCT: 29.2 % — ABNORMAL LOW (ref 36.0–46.0)
Hemoglobin: 9.2 g/dL — ABNORMAL LOW (ref 12.0–15.0)
MCH: 22.9 pg — ABNORMAL LOW (ref 26.0–34.0)
MCHC: 31.5 g/dL (ref 30.0–36.0)
MCV: 72.6 fL — ABNORMAL LOW (ref 80.0–100.0)
Platelets: 187 10*3/uL (ref 150–400)
RBC: 4.02 MIL/uL (ref 3.87–5.11)
RDW: 16.9 % — ABNORMAL HIGH (ref 11.5–15.5)
WBC: 8.6 10*3/uL (ref 4.0–10.5)
nRBC: 0 % (ref 0.0–0.2)

## 2019-08-20 MED ORDER — SENNOSIDES-DOCUSATE SODIUM 8.6-50 MG PO TABS: 1.0000 | ORAL_TABLET | Freq: Two times a day (BID) | ORAL | 1 refills | Status: DC

## 2019-08-20 MED ORDER — OXYCODONE HCL 5 MG PO TABS: 5.0000 mg | ORAL_TABLET | ORAL | 0 refills | Status: DC | PRN

## 2019-08-20 MED ORDER — POLYETHYLENE GLYCOL 3350 17 G PO PACK: 17.0000 g | PACK | Freq: Two times a day (BID) | ORAL | 0 refills | Status: DC

## 2019-08-20 MED ORDER — IBUPROFEN 600 MG PO TABS: 600.0000 mg | ORAL_TABLET | Freq: Four times a day (QID) | ORAL | 0 refills | Status: DC

## 2019-08-21 ENCOUNTER — Ambulatory Visit: Payer: Self-pay

## 2019-08-21 NOTE — Lactation Note (Signed)
This note was copied from a baby's chart. Lactation Consultation Note:  Mother is breastfeeding and formula feeding. Infant is under photo tx. Mother reports that she is only seeing colostrum. She is unsure if infant is getting enough with breastfeeding only.  Reviewed hand expression and observed large drops of colostrum.  Discussed the use of a pump to stimulate milk volume while infant is taking in formula with a bottle.   Mother informed that she could use the DEBP or the hand pump. Mother reports that she prefers the hand pump.  Mother was given the hand pump with instructions to pump for 15 mins on each breast every 2-3 hours.  Observed large drops of colostrum when mother pumped.   Mother aware that she can also try the electric pump if desires.  Mother reports that she is active with [facility].  Discussed treatment and prevention of engorgement.  Encouraged mother to breastfeed infant , supplement with ebm/formula and pump after each feeding.   Mother receptive to all teaching.   Patient Name: Morgan Ramsey [id number] Date: 08/21/2019 Reason for consult: Follow-up assessment   Maternal Data Has patient been taught Hand Expression?: Yes  Feeding Feeding Type: Formula  LATCH Score                   Interventions    Lactation Tools Discussed/Used WIC Program: Yes Pump Review: Setup, frequency, and cleaning;Milk Storage Initiated by:: Jess Barters RN,IBCLC Date initiated:: 08/22/19   Consult Status Consult Status: Follow-up Date: 08/22/19 Follow-up type: In-patient    Jess Barters [facility] 08/21/2019, 2:14 PM

## 2019-08-23 ENCOUNTER — Ambulatory Visit: Payer: Self-pay

## 2019-08-23 NOTE — Lactation Note (Signed)
This note was copied from a baby's chart. Lactation Consultation Note  Patient Name: Morgan Ramsey [id number] Date: 08/23/2019   Mom is a P4. Infant is 108 days old. In-house interpreter present during consult. Mom has no questions about her breasts, breastfeeding, or bottle feeding. Mom did comment that she is noticing milk coming out when previously she had not noticed any at all.    Matthias Hughs [facility] 08/23/2019, 9:22 AM

## 2019-08-28 ENCOUNTER — Other Ambulatory Visit: Payer: Self-pay

## 2019-08-28 ENCOUNTER — Ambulatory Visit (INDEPENDENT_AMBULATORY_CARE_PROVIDER_SITE_OTHER): Payer: Medicaid Other | Admitting: Obstetrics and Gynecology

## 2019-08-28 ENCOUNTER — Encounter: Payer: Self-pay | Admitting: Obstetrics and Gynecology

## 2019-08-28 VITALS — BP 138/88 | HR 90 | Ht 63.0 in | Wt 205.0 lb

## 2019-08-28 DIAGNOSIS — R519 Headache, unspecified: Secondary | ICD-10-CM

## 2019-08-28 DIAGNOSIS — K649 Unspecified hemorrhoids: Secondary | ICD-10-CM

## 2019-08-28 MED ORDER — HYDROCORTISONE ACETATE 1 % EX OINT: 1.0000 "application " | TOPICAL_OINTMENT | Freq: Two times a day (BID) | CUTANEOUS | 0 refills | Status: DC

## 2019-08-28 MED ORDER — MEDROXYPROGESTERONE ACETATE 150 MG/ML IM SUSP: 150.0000 mg | Freq: Once | INTRAMUSCULAR | Status: DC

## 2019-08-28 NOTE — Progress Notes (Signed)
  GYNECOLOGY PROGRESS NOTE - History, Exam and Plan of Care discussed using in-person interpreter  History:  [name] is a 44 y.o. [id number] presents to [place] office today for problem postpartum visit. She reports H/A x 3 days and worsening painful hemorrhoids.  She denies dizziness, shortness of breath, n/v, or fever/chills.    The following portions of the patient's history were reviewed and updated as appropriate: allergies, current medications, past family history, past medical history, past social history, past surgical history and problem list.  Review of Systems:  Pertinent items are noted in HPI.   Objective:  Physical Exam Blood pressure 138/88, pulse 90, height 5\' 3"  (1.6 m), weight 205 lb (93 kg), last menstrual period 11/24/2018, currently breastfeeding. VS reviewed, nursing note reviewed,  Constitutional: well developed, well nourished, no distress HEENT: normocephalic CV: normal rate Pulm/chest wall: normal effort Breast Exam: deferred Abdomen: soft Neuro: alert and oriented x 3 Skin: warm, dry Psych: affect normal Pelvic exam: deferred Rectum: external hemorrhoids, no thrombosis noted  Assessment & Plan:  1.Hemorrhoids, unspecified hemorrhoid type - Rx for Hydrocortisone Acetate 1 % OINT; Apply 1 application topically 2 (two) times daily.  Dispense: 28.4 g; Refill: 0  2. Acute nonintractable headache, unspecified headache type - Advised to take Tylenol 1000 mg prn h/a - Advised to check BP in AM and RN staff will call to check on her  Laury Deep, [place] 08/28/2019 2:34 PM

## 2019-08-29 ENCOUNTER — Ambulatory Visit (INDEPENDENT_AMBULATORY_CARE_PROVIDER_SITE_OTHER): Payer: Self-pay

## 2019-08-29 DIAGNOSIS — R030 Elevated blood-pressure reading, without diagnosis of hypertension: Secondary | ICD-10-CM

## 2019-08-29 NOTE — Progress Notes (Signed)
Pt here for BP Check, on 08/28/2019, her BP was 124/84 P-83 and today:120/84, P-79.

## 2019-08-29 NOTE — Progress Notes (Signed)
I have reviewed the chart and agree with nursing staff's documentation of this patient's encounter.  Laury Deep, CNM 08/29/2019 9:09 PM

## 2019-08-29 NOTE — Progress Notes (Signed)
9:32a- Called pt for BP Nurse visit using # ending [id number], no answer.left VM that will call back in 10 to 15 min. The phone# ending [phone] is not a working #. Used [employer] id # [id number].   11:05a-  I connected with  Morgan Ramsey on 08/29/19 at 10:40 AM EST by telephone and verified that I am speaking with the correct person using two identifiers.   I discussed the limitations, risks, security and privacy concerns of performing an evaluation and management service by telephone and the availability of in person appointments. I also discussed with the patient that there may be a patient responsible charge related to this service. The patient expressed understanding and agreed to proceed.  Bethanne Ginger, CMA 08/29/2019  11:06 AM

## 2019-09-18 ENCOUNTER — Other Ambulatory Visit: Payer: Self-pay

## 2019-09-18 ENCOUNTER — Telehealth (INDEPENDENT_AMBULATORY_CARE_PROVIDER_SITE_OTHER): Payer: Self-pay | Admitting: Obstetrics and Gynecology

## 2019-09-18 ENCOUNTER — Encounter: Payer: Self-pay | Admitting: Obstetrics and Gynecology

## 2019-09-18 DIAGNOSIS — Z3009 Encounter for other general counseling and advice on contraception: Secondary | ICD-10-CM

## 2019-09-18 DIAGNOSIS — R87618 Other abnormal cytological findings on specimens from cervix uteri: Secondary | ICD-10-CM

## 2019-09-18 DIAGNOSIS — Z8619 Personal history of other infectious and parasitic diseases: Secondary | ICD-10-CM

## 2019-09-18 DIAGNOSIS — Z98891 History of uterine scar from previous surgery: Secondary | ICD-10-CM

## 2019-09-18 DIAGNOSIS — Z789 Other specified health status: Secondary | ICD-10-CM

## 2019-09-18 DIAGNOSIS — Z603 Acculturation difficulty: Secondary | ICD-10-CM

## 2019-09-18 DIAGNOSIS — O24119 Pre-existing diabetes mellitus, type 2, in pregnancy, unspecified trimester: Secondary | ICD-10-CM

## 2019-09-18 DIAGNOSIS — O99210 Obesity complicating pregnancy, unspecified trimester: Secondary | ICD-10-CM

## 2019-09-18 NOTE — Progress Notes (Signed)
I connected with@ on 09/18/19 at  8:15 AM EST by: telephone with [facility] interpreter [place] ID [phone] and verified that I am speaking with the correct person using two identifiers.  Patient is located at home and provider is located at home.   The following staff members participated in the virtual visit: Louisa Second, [place] VISIT ENCOUNTER NOTE  Provider location: [facility] [place] [facility] [place]   I connected with Thane Edu on 09/18/19 at  8:15 AM EST by MyChart Video Encounter at home and verified that I am speaking with the correct person using two identifiers.    I discussed the limitations, risks, security and privacy concerns of performing an evaluation and management service virtually and the availability of in person appointments. I also discussed with the patient that there may be a patient responsible charge related to this service. The patient expressed understanding and agreed to proceed.  Chief Complaint: Postpartum Visit  History of Present Illness: Lonya Schrenk is a 44 y.o. Hispanic [id number] being evaluated for postpartum followup.    She is s/p vacuum-assisted VBAC on 08/19/19 at 38 weeks; she was discharged to home on PPD#2. Pregnancy complicated by GDMA2, AMA, HSV, h/o CS. Baby is doing well.  Complains of occasional stinging in belly. Occasional bleeding.  Vaginal bleeding or discharge: No  Intercourse: No  Contraception: IUD at [facility] Mode of feeding infant: Bottle and Breast PP depression s/s: No  Any bowel or bladder issues: No  Pap smear: no abnormalities but positive high risk HPV, colpo 04/2019 benign  Review of Systems: Positive for n/a. Her 12 point review of systems is negative or as noted in the History of Present Illness.  Patient Active Problem List   Diagnosis Date Noted  . Polyhydramnios, antepartum complication [id number]  . Polyhydramnios in third trimester 08/13/2019  . Unstable lie  of fetus 08/06/2019  . Obesity (BMI 30.0-34.9) 05/07/2019  . Transient hypertension of pregnancy 05/07/2019  . Language barrier 04/26/2015  . H/O herpes simplex type 2 infection 04/26/2015  . Previous cesarean section 04/26/2015  . Obesity in pregnancy 04/26/2015  . Anemia 04/26/2015    Medications Jeanny Camacho-Gonzalez had no medications administered during this visit. Current Outpatient Medications  Medication Sig Dispense Refill  . ferrous sulfate 325 (65 FE) MG tablet Take 325 mg by mouth daily with breakfast.    . ibuprofen (ADVIL) 600 MG tablet Take 1 tablet (600 mg total) by mouth every 6 (six) hours. 30 tablet 0  . polyethylene glycol (MIRALAX / GLYCOLAX) 17 g packet Take 17 g by mouth 2 (two) times daily. 14 each 0  . Prenatal Vit-Fe Fumarate-FA (PRENATAL MULTIVITAMIN) TABS tablet Take 1 tablet by mouth daily at 12 noon. 30 tablet 1  . Hydrocortisone Acetate 1 % OINT Apply 1 application topically 2 (two) times daily. (Patient not taking: Reported on 09/18/2019) 28.4 g 0   No current facility-administered medications for this visit.    Allergies Patient has no known allergies.  Physical Exam:  BP 116/89   Pulse 90   LMP 11/24/2018 (Exact Date)   Breastfeeding Yes   General:  Alert, oriented and cooperative. Patient is in no acute distress.  Mental Status: Normal mood and affect. Normal behavior. Normal judgment and thought content.   Respiratory: Normal respiratory effort noted, no problems with respiration noted  Rest of physical exam deferred due to type of encounter  PP Depression Screening:   Edinburgh Postnatal Depression Scale Screening Tool 09/18/2019  08/20/2019 08/19/2019  I have been able to laugh and see the funny side of things. 0 0 (No Data)  I have looked forward with enjoyment to things. 0 0 -  I have blamed myself unnecessarily when things went wrong. 0 0 -  I have been anxious or worried for no good reason. 0 0 -  I have felt scared or panicky for no good  reason. 0 0 -  Things have been getting on top of me. 0 1 -  I have been so unhappy that I have had difficulty sleeping. 0 0 -  I have felt sad or miserable. 0 0 -  I have been so unhappy that I have been crying. 0 0 -  The thought of harming myself has occurred to me. 0 0 -  Edinburgh Postnatal Depression Scale Total 0 1 -     Assessment:Patient is a 44 y.o. [id number] who is 4 weeks postpartum from a vacuum-assisted VBAC.  She is doing well.   Plan:  1. Postpartum state Doing well  2. Encounter for counseling regarding contraception To get IUD at [facility]  3. Previous cesarean section S/p VBAC  4. H/O herpes simplex type 2 infection  5. Obesity in pregnancy  6. Language barrier Patent attorney used  7. Type 2 diabetes mellitus during pregnancy, antepartum - patient not sure if she has GDM or T2DM, has been told both - needs 2 hr GTT - reviewed importance of following up for this, she verbalizes understanding  8. Other abnormal cytological finding of specimen from cervix Pap 03/2019: negative cytology with high risk HPV Colpo 04/2019: benign - needs repeat pap 1 year, so 03/2020 - she will get at [facility]   RTC 2-3 weeks for pp 2 hr GTT  I discussed the assessment and treatment plan with the patient. The patient was provided an opportunity to ask questions and all were answered. The patient agreed with the plan and demonstrated an understanding of the instructions.   The patient was advised to call back or seek an in-person evaluation/go to the ED for any concerning postpartum symptoms.  I provided 22 minutes of face-to-face time during this encounter.   Sloan Leiter, MD [facility] [place], [place]

## 2019-09-25 ENCOUNTER — Encounter (HOSPITAL_COMMUNITY): Payer: Self-pay | Admitting: Emergency Medicine

## 2019-09-25 ENCOUNTER — Other Ambulatory Visit: Payer: Self-pay

## 2019-09-25 ENCOUNTER — Emergency Department (HOSPITAL_COMMUNITY)
Admission: EM | Admit: 2019-09-25 | Discharge: 2019-09-26 | Disposition: A | Discharge status: Home / Self Care | Payer: Self-pay | Attending: Emergency Medicine | Admitting: Emergency Medicine

## 2019-09-25 DIAGNOSIS — N939 Abnormal uterine and vaginal bleeding, unspecified: Secondary | ICD-10-CM

## 2019-09-25 DIAGNOSIS — M545 Low back pain, unspecified: Secondary | ICD-10-CM

## 2019-09-25 DIAGNOSIS — N390 Urinary tract infection, site not specified: Secondary | ICD-10-CM

## 2019-09-25 DIAGNOSIS — R1030 Lower abdominal pain, unspecified: Secondary | ICD-10-CM | POA: Insufficient documentation

## 2019-09-25 DIAGNOSIS — R739 Hyperglycemia, unspecified: Secondary | ICD-10-CM

## 2019-09-25 DIAGNOSIS — Z79899 Other long term (current) drug therapy: Secondary | ICD-10-CM | POA: Insufficient documentation

## 2019-09-25 LAB — CBC
HCT: 36.3 % (ref 36.0–46.0)
Hemoglobin: 11.3 g/dL — ABNORMAL LOW (ref 12.0–15.0)
MCH: 23.5 pg — ABNORMAL LOW (ref 26.0–34.0)
MCHC: 31.1 g/dL (ref 30.0–36.0)
MCV: 75.5 fL — ABNORMAL LOW (ref 80.0–100.0)
Platelets: 286 10*3/uL (ref 150–400)
RBC: 4.81 MIL/uL (ref 3.87–5.11)
RDW: 15.9 % — ABNORMAL HIGH (ref 11.5–15.5)
WBC: 7.9 10*3/uL (ref 4.0–10.5)
nRBC: 0 % (ref 0.0–0.2)

## 2019-09-25 LAB — COMPREHENSIVE METABOLIC PANEL
ALT: 35 U/L (ref 0–44)
AST: 29 U/L (ref 15–41)
Albumin: 3.2 g/dL — ABNORMAL LOW (ref 3.5–5.0)
Alkaline Phosphatase: 103 U/L (ref 38–126)
Anion gap: 9 (ref 5–15)
BUN: 12 mg/dL (ref 6–20)
CO2: 25 mmol/L (ref 22–32)
Calcium: 8.9 mg/dL (ref 8.9–10.3)
Chloride: 103 mmol/L (ref 98–111)
Creatinine, Ser: 0.83 mg/dL (ref 0.44–1.00)
GFR calc Af Amer: >60 mL/min (ref >60)
GFR calc non Af Amer: >60 mL/min (ref >60)
Glucose, Bld: 224 mg/dL — ABNORMAL HIGH (ref 70–99)
Potassium: 3.5 mmol/L (ref 3.5–5.1)
Sodium: 137 mmol/L (ref 135–145)
Total Bilirubin: 0.2 mg/dL — ABNORMAL LOW (ref 0.3–1.2)
Total Protein: 6.8 g/dL (ref 6.5–8.1)

## 2019-09-25 LAB — I-STAT BETA HCG BLOOD, ED (MC, WL, AP ONLY): I-stat hCG, quantitative: <5 m[IU]/mL (ref <5)

## 2019-09-25 LAB — LIPASE, BLOOD: Lipase: 26 U/L (ref 11–51)

## 2019-09-25 MED ORDER — SODIUM CHLORIDE 0.9% FLUSH: 3.0000 mL | Freq: Once | INTRAVENOUS | Status: DC

## 2019-09-25 NOTE — ED Triage Notes (Signed)
Patient reports vaginal bleeding with low abdominal cramping onset Tuesday this week , S/P vaginal delivery last 08/19/2019, denies emesis or diarrhea , no fever or chills .

## 2019-09-26 ENCOUNTER — Emergency Department (HOSPITAL_COMMUNITY): Payer: Self-pay

## 2019-09-26 LAB — URINALYSIS, ROUTINE W REFLEX MICROSCOPIC
Bilirubin Urine: NEGATIVE
Glucose, UA: 50 mg/dL — AB
Ketones, ur: NEGATIVE mg/dL
Nitrite: NEGATIVE
Protein, ur: 100 mg/dL — AB
RBC / HPF: >50 RBC/hpf — ABNORMAL HIGH (ref 0–5)
Specific Gravity, Urine: 1.026 (ref 1.005–1.030)
pH: 6 (ref 5.0–8.0)

## 2019-09-26 LAB — URINE CULTURE: Culture: <10000 — AB

## 2019-09-26 MED ORDER — FENTANYL CITRATE (PF) 100 MCG/2ML IJ SOLN
50.0000 ug | Freq: Once | INTRAMUSCULAR | Status: AC
  Administered 2019-09-26: 50 ug via INTRAMUSCULAR
  Filled 2019-09-26: qty 2

## 2019-09-26 MED ORDER — MEDROXYPROGESTERONE ACETATE 5 MG PO TABS: 10.0000 mg | ORAL_TABLET | Freq: Three times a day (TID) | ORAL | 0 refills | Status: DC

## 2019-09-26 MED ORDER — MEDROXYPROGESTERONE ACETATE 10 MG PO TABS
10.0000 mg | ORAL_TABLET | Freq: Once | ORAL | Status: AC
  Administered 2019-09-26: 10 mg via ORAL
  Filled 2019-09-26: qty 1

## 2019-09-26 MED ORDER — CEPHALEXIN 500 MG PO CAPS: ORAL_CAPSULE | ORAL | 0 refills | Status: DC

## 2019-09-26 NOTE — Discharge Instructions (Addendum)
1. Medications: Keflex for UTI, Provera for your vaginal bleeding, usual home medications 2. Treatment: rest, drink plenty of fluids, rest and gentle stretching for your back pain.  You may take Tylenol. 3. Follow Up: Please followup with your primary doctor in 2-3 days for discussion of your elevated blood sugar, back pain and reassessment of your UTI and vaginal bleeding.  Please also follow-up with health department or women's outpatient clinic for further evaluation of your vaginal bleeding.  Please return to the ER for worsening abdominal pain, falls, passing out, chest pain, shortness of breath, fevers or other concerns.

## 2019-09-26 NOTE — ED Notes (Signed)
Spanish speaking RN discussed discharge instructions with pt. Pt verbalized understanding with no questions at this time. Pt to follow up with OB.

## 2019-09-26 NOTE — ED Provider Notes (Signed)
[facility] EMERGENCY DEPARTMENT Provider Note   CSN: [id number] Arrival date & time: 09/25/19  2022     History Chief Complaint  Patient presents with   Abdominal Pain   Vaginal Bleeding    Morgan Ramsey is a 44 y.o. female [phone] with a hx of gestational diabetes presents to the Emergency Department complaining of gradual, persistent, progressively worsening lower abd pain onset 09/23/19. Pt reports she gave birth on 08/19/2019.  Records reviewed and show VBAC at [redacted]w[redacted]d with a vacuum assisted delivery and 3rd degree laceration..  She reports heavy bleeding on Tues 09/23/2019 in the afternoon with associated lower abd pain, low back pain.  Pt reports her bleeding is heavy and often soaks the pads and her clothes.  Pt reports her bleeding had become very light but had not stopped before Tuesday.  Pt has not yet seen OB/GYN or discussed with the office.  Pt reports the bleeding is associated with lower abd cramping that waxes and wanes with the bleeding. Pt denies fever, chills  Pt also reports low back pain which began after delivery, but returned on tuesday without trauma, falls or new activity.  Pt reports this pain is constant.  She denies dysuria. Pt also reports he legs feel very tired since the birth, but she has not had any specific weakness, numbness, weakness, falls, stumbling or gait disturbance.   Pt also reporting she has had bilateral headaches since delivery.  Denies syncope, neck pain, neck stiffness.  Pt was evaluated for this on 1/15 with the recommendation for tylenol.  Pt reports she has taken this and her prescribed oxycodone with complete relief of headache every time it occurs.     The history is provided by the patient and medical records. The history is limited by a language barrier. A language interpreter was used.       Past Medical History:  Diagnosis Date   AMA (advanced maternal age) multigravida 35+    Gestational diabetes 2017   glyburide   Herpes    History of candidal vulvovaginitis    History of chlamydia 2008   Obese     Patient Active Problem List   Diagnosis Date Noted   Polyhydramnios, antepartum complication [id number]   Polyhydramnios in third trimester 08/13/2019   Unstable lie of fetus 08/06/2019   Obesity (BMI 30.0-34.9) 05/07/2019   Transient hypertension of pregnancy 05/07/2019   Language barrier 04/26/2015   H/O herpes simplex type 2 infection 04/26/2015   Previous cesarean section 04/26/2015   Obesity in pregnancy 04/26/2015   Anemia 04/26/2015    Past Surgical History:  Procedure Laterality Date   CESAREAN SECTION  05/21/2011   Procedure: CESAREAN SECTION;  Surgeon: Jonnie Kind, MD;  Location: [facility];  Service: Gynecology;  Laterality: N/A;  Primary cesarean section with delivery of baby girl at 16. Apgars 9/9.   HERPES SIMPLEX VIRUS DFA     last outbreak prior to 2017       Family History  Problem Relation Age of Onset   Diabetes Mother        oral agents   Hypertension Mother    Hypertension Father    Hypertension Brother    Anesthesia problems Neg Hx    Hypotension Neg Hx    Malignant hyperthermia Neg Hx    Pseudochol deficiency Neg Hx     Social History   Tobacco Use   Smoking status: Never Smoker   Smokeless tobacco: Never Used  Substance Use Topics  Alcohol use: No   Drug use: No    Home Medications Prior to Admission medications   Medication Sig Start Date End Date Taking? Authorizing Provider  cephALEXin (KEFLEX) 500 MG capsule 1 cap po bid x 7 days 09/26/19   Kalley Nicholl, Jarrett Soho, PA-C  ferrous sulfate 325 (65 FE) MG tablet Take 325 mg by mouth daily with breakfast.    [provider]  Hydrocortisone Acetate 1 % OINT Apply 1 application topically 2 (two) times daily. Patient not taking: Reported on 09/18/2019 08/28/19   Laury Deep, CNM  ibuprofen (ADVIL) 600 MG tablet Take 1 tablet (600 mg total) by mouth every  6 (six) hours. 08/20/19   Sparacino, Hailey L, DO  medroxyPROGESTERone (PROVERA) 5 MG tablet Take 2 tablets (10 mg total) by mouth every 8 (eight) hours for 5 days. 09/26/19 10/01/19  Mackay Hanauer, Jarrett Soho, PA-C  polyethylene glycol (MIRALAX / GLYCOLAX) 17 g packet Take 17 g by mouth 2 (two) times daily. 08/20/19   Sparacino, Hailey L, DO  Prenatal Vit-Fe Fumarate-FA (PRENATAL MULTIVITAMIN) TABS tablet Take 1 tablet by mouth daily at 12 noon. 07/26/15   Constant, Peggy, MD    Allergies    Patient has no known allergies.  Review of Systems   Review of Systems  Constitutional: Negative for appetite change, diaphoresis, fatigue, fever and unexpected weight change.  HENT: Negative for mouth sores.   Eyes: Negative for visual disturbance.  Respiratory: Negative for cough, chest tightness, shortness of breath and wheezing.   Cardiovascular: Negative for chest pain.  Gastrointestinal: Positive for abdominal pain. Negative for constipation, diarrhea, nausea and vomiting.  Endocrine: Negative for polydipsia, polyphagia and polyuria.  Genitourinary: Positive for vaginal bleeding. Negative for dysuria, frequency, hematuria and urgency.  Musculoskeletal: Positive for back pain. Negative for neck stiffness.  Skin: Negative for rash.  Allergic/Immunologic: Negative for immunocompromised state.  Neurological: Negative for syncope, light-headedness and headaches.  Hematological: Does not bruise/bleed easily.  Psychiatric/Behavioral: Negative for sleep disturbance. The patient is not nervous/anxious.     Physical Exam Updated Vital Signs BP 139/87    Pulse 82    Temp 99.3 F (37.4 C) (Oral)    Resp 19    Ht 5' 0.5" (1.537 m)    Wt 96.2 kg    SpO2 100%    BMI 40.72 kg/m   Physical Exam Vitals and nursing note reviewed.  Constitutional:      General: She is not in acute distress.    Appearance: She is not diaphoretic.  HENT:     Head: Normocephalic.  Eyes:     General: No scleral icterus.     Conjunctiva/sclera: Conjunctivae normal.  Cardiovascular:     Rate and Rhythm: Normal rate and regular rhythm.     Pulses: Normal pulses.          Radial pulses are 2+ on the right side and 2+ on the left side.  Pulmonary:     Effort: No tachypnea, accessory muscle usage, prolonged expiration, respiratory distress or retractions.     Breath sounds: No stridor.     Comments: Equal chest rise. No increased work of breathing. Abdominal:     General: There is no distension.     Palpations: Abdomen is soft.     Tenderness: There is no abdominal tenderness. There is no right CVA tenderness, left CVA tenderness, guarding or rebound.     Comments: Soft and nontender  Musculoskeletal:     Cervical back: Normal range of motion.  Comments: Moves all extremities equally and without difficulty. No midline tenderness to the T-spine or L-spine. Mild, bilateral paraspinal tenderness to the L-spine.  No bruising or ecchymosis. Full range of motion of the T-spine and L-spine.  Skin:    General: Skin is warm and dry.     Capillary Refill: Capillary refill takes less than 2 seconds.  Neurological:     Mental Status: She is alert.     GCS: GCS eye subscore is 4. GCS verbal subscore is 5. GCS motor subscore is 6.     Comments: Speech is clear and goal oriented. Sensation intact to the lower extremities. Strength 5/5 with dorsiflexion and plantarflexion.  Psychiatric:        Mood and Affect: Mood normal.     ED Results / Procedures / Treatments   Labs (all labs ordered are listed, but only abnormal results are displayed) Labs Reviewed  COMPREHENSIVE METABOLIC PANEL - Abnormal; Notable for the following components:      Result Value   Glucose, Bld 224 (*)    Albumin 3.2 (*)    Total Bilirubin 0.2 (*)    All other components within normal limits  CBC - Abnormal; Notable for the following components:   Hemoglobin 11.3 (*)    MCV 75.5 (*)    MCH 23.5 (*)    RDW 15.9 (*)    All other  components within normal limits  URINALYSIS, ROUTINE W REFLEX MICROSCOPIC - Abnormal; Notable for the following components:   Color, Urine RED (*)    APPearance CLOUDY (*)    Glucose, UA 50 (*)    Hgb urine dipstick LARGE (*)    Protein, ur 100 (*)    Leukocytes,Ua SMALL (*)    RBC / HPF >50 (*)    Bacteria, UA RARE (*)    All other components within normal limits  URINE CULTURE  LIPASE, BLOOD  I-STAT BETA HCG BLOOD, ED ([facility], [facility], [facility] ONLY)     Radiology US Transvaginal Non-OB  Result Date: 09/26/2019 CLINICAL DATA:  Pelvic pain and vaginal bleeding, postpartum EXAM: TRANSABDOMINAL AND TRANSVAGINAL ULTRASOUND OF PELVIS DOPPLER ULTRASOUND OF OVARIES TECHNIQUE: Both transabdominal and transvaginal ultrasound examinations of the pelvis were performed. Transabdominal technique was performed for global imaging of the pelvis including uterus, ovaries, adnexal regions, and pelvic cul-de-sac. It was necessary to proceed with endovaginal exam following the transabdominal exam to visualize the . Color and duplex Doppler ultrasound was utilized to evaluate blood flow to the ovaries. COMPARISON:  None. FINDINGS: Uterus Measurements: 11.3 x 5.8 x 6.8 cm = volume: 234.5 cm is mL. No fibroids or other mass visualized. Endometrium Thickness: . 6 mm there is fluid and small amount of echogenic material seen swirling within the endometrial canal. Right ovary Measurements: 2.9 x 2.0 x 1.7 cm = volume: 4.9 mL. Normal appearance/no adnexal mass. Left ovary Measurements: 3.4 x 1.4 x 1.9 cm = volume: 4.6 mL. Normal appearance/no adnexal mass. Pulsed Doppler evaluation of both ovaries demonstrates normal low-resistance arterial and venous waveforms. Other findings No abnormal free fluid. IMPRESSION: Small amount of echogenic fluid/material within the endometrial canal which is nonspecific, may be due to blood clot within the endometrial, less likely thought to be small amount of retained products. Electronically Signed    By: Prudencio Pair M.D.   On: 09/26/2019 02:39   US Pelvis Complete  Result Date: 09/26/2019 CLINICAL DATA:  Pelvic pain and vaginal bleeding, postpartum EXAM: TRANSABDOMINAL AND TRANSVAGINAL ULTRASOUND OF PELVIS DOPPLER ULTRASOUND OF  OVARIES TECHNIQUE: Both transabdominal and transvaginal ultrasound examinations of the pelvis were performed. Transabdominal technique was performed for global imaging of the pelvis including uterus, ovaries, adnexal regions, and pelvic cul-de-sac. It was necessary to proceed with endovaginal exam following the transabdominal exam to visualize the . Color and duplex Doppler ultrasound was utilized to evaluate blood flow to the ovaries. COMPARISON:  None. FINDINGS: Uterus Measurements: 11.3 x 5.8 x 6.8 cm = volume: 234.5 cm is mL. No fibroids or other mass visualized. Endometrium Thickness: . 6 mm there is fluid and small amount of echogenic material seen swirling within the endometrial canal. Right ovary Measurements: 2.9 x 2.0 x 1.7 cm = volume: 4.9 mL. Normal appearance/no adnexal mass. Left ovary Measurements: 3.4 x 1.4 x 1.9 cm = volume: 4.6 mL. Normal appearance/no adnexal mass. Pulsed Doppler evaluation of both ovaries demonstrates normal low-resistance arterial and venous waveforms. Other findings No abnormal free fluid. IMPRESSION: Small amount of echogenic fluid/material within the endometrial canal which is nonspecific, may be due to blood clot within the endometrial, less likely thought to be small amount of retained products. Electronically Signed   By: Prudencio Pair M.D.   On: 09/26/2019 02:39   [place] Art/Ven Flow Abd Pelv Doppler  Result Date: 09/26/2019 CLINICAL DATA:  Pelvic pain and vaginal bleeding, postpartum EXAM: TRANSABDOMINAL AND TRANSVAGINAL ULTRASOUND OF PELVIS DOPPLER ULTRASOUND OF OVARIES TECHNIQUE: Both transabdominal and transvaginal ultrasound examinations of the pelvis were performed. Transabdominal technique was performed for global imaging of the  pelvis including uterus, ovaries, adnexal regions, and pelvic cul-de-sac. It was necessary to proceed with endovaginal exam following the transabdominal exam to visualize the . Color and duplex Doppler ultrasound was utilized to evaluate blood flow to the ovaries. COMPARISON:  None. FINDINGS: Uterus Measurements: 11.3 x 5.8 x 6.8 cm = volume: 234.5 cm is mL. No fibroids or other mass visualized. Endometrium Thickness: . 6 mm there is fluid and small amount of echogenic material seen swirling within the endometrial canal. Right ovary Measurements: 2.9 x 2.0 x 1.7 cm = volume: 4.9 mL. Normal appearance/no adnexal mass. Left ovary Measurements: 3.4 x 1.4 x 1.9 cm = volume: 4.6 mL. Normal appearance/no adnexal mass. Pulsed Doppler evaluation of both ovaries demonstrates normal low-resistance arterial and venous waveforms. Other findings No abnormal free fluid. IMPRESSION: Small amount of echogenic fluid/material within the endometrial canal which is nonspecific, may be due to blood clot within the endometrial, less likely thought to be small amount of retained products. Electronically Signed   By: Prudencio Pair M.D.   On: 09/26/2019 02:39    Procedures Procedures (including critical care time)  Medications Ordered in ED Medications  sodium chloride flush (NS) 0.9 % injection 3 mL (3 mLs Intravenous Not Given 09/25/19 2354)  medroxyPROGESTERone (PROVERA) tablet 10 mg (has no administration in time range)  fentaNYL (SUBLIMAZE) injection 50 mcg (50 mcg Intramuscular Given 09/26/19 0126)    ED Course  I have reviewed the triage vital signs and the nursing notes.  Pertinent labs & imaging results that were available during my care of the patient were reviewed by me and considered in my medical decision making (see chart for details).  Clinical Course as of Sep 25 405  Fri Sep 26, 2019  0108 improved  Hemoglobin(!): 11.3 [HM]  0312 Called to discuss [place] finding with Dr. Ilda Basset who was in a c-section.  Will  return call when he is available   [HM]  [phone] Discussed with Dr. Ilda Basset who does not think  this represents retained products of conception.  He recommends Rivera 10 mg every 8 hours for 5 days.  Will give first dose here.  She will need close follow-up with your county health department for contraceptive.   [HM]    Clinical Course User Index [HM] Nesa Distel, Gwenlyn Perking   MDM Rules/Calculators/A&P                      Patient presents with numerous complaints.  She is approximately 5 weeks post vaginal delivery.  Her primary complaint today is vaginal bleeding and abdominal pain.  Her abdomen is soft and nontender on exam but she reports that her abdominal pain is severe.  Labs are reassuring.  Elevated glucose but normal anion gap.  Patient denies polyuria or polydipsia.  No ketones to suggest DKA.  Hemoglobin 11.3 and improved from delivery.  Urinalysis with potential urinary tract infection but large hemoglobin.  Urine culture sent.  Will obtain ultrasound for evaluation of torsion or retained products of conception.  Patient also reports low back pain.  Exam is reassuring.  No red flags for back pain.  She is ambulatory here in the emergency department without difficulty.  She reports that her legs feel tired and heavy but she has normal strength on exam.  No weakness is found.  No saddle anesthesia.  Highly doubt cauda equina.  4:05 AM  Ultrasound with fluid in the endometrial canal.  Discussed with OB/GYN who feels this is more likely to be a menstrual cycle and less likely to be retained products of conception.  They recommend Provera 10 mg every 8 hours for 5 days.  Patient will need close follow-up with health department.  She reports she does not have an appointment for contraception with the health department as they report they are not inserting IUDs at this time.  I have encouraged her to follow-up within the next several days for further discussion with them about what her  alternative options are.  She states understanding and is in agreement with this plan.  Also discussed reasons to return to the emergency department.  Final Clinical Impression(s) / ED Diagnoses Final diagnoses:  Vaginal bleeding  Hyperglycemia  Urinary tract infection without hematuria, site unspecified  Acute bilateral low back pain without sciatica    Rx / DC Orders ED Discharge Orders         Ordered    medroxyPROGESTERone (PROVERA) 5 MG tablet  Every 8 hours     09/26/19 0403    cephALEXin (KEFLEX) 500 MG capsule     09/26/19 0403           Beulah Matusek, Jarrett Soho, PA-C 09/26/19 0408    Ripley Fraise, MD 09/26/19 [phone]

## 2019-09-26 NOTE — ED Notes (Signed)
Pt transported to US

## 2019-10-01 ENCOUNTER — Other Ambulatory Visit: Payer: Self-pay | Admitting: *Deleted

## 2019-10-01 DIAGNOSIS — Z8632 Personal history of gestational diabetes: Secondary | ICD-10-CM

## 2019-10-01 DIAGNOSIS — O09299 Supervision of pregnancy with other poor reproductive or obstetric history, unspecified trimester: Secondary | ICD-10-CM

## 2019-10-02 ENCOUNTER — Other Ambulatory Visit: Payer: Self-pay

## 2019-10-06 ENCOUNTER — Other Ambulatory Visit: Payer: Self-pay

## 2019-10-06 ENCOUNTER — Telehealth: Payer: Self-pay | Admitting: Family Medicine

## 2019-10-06 NOTE — Telephone Encounter (Signed)
The patient called in to reschedule the appointment as she has the children today and they have school. She also stated she can only come on Wednesday and Thursday.

## 2019-10-15 ENCOUNTER — Other Ambulatory Visit: Payer: Self-pay

## 2019-10-15 DIAGNOSIS — Z8632 Personal history of gestational diabetes: Secondary | ICD-10-CM

## 2019-10-15 DIAGNOSIS — O09299 Supervision of pregnancy with other poor reproductive or obstetric history, unspecified trimester: Secondary | ICD-10-CM

## 2019-10-16 LAB — GLUCOSE TOLERANCE, 2 HOURS
Glucose, 2 hour: 263 mg/dL — ABNORMAL HIGH (ref 65–139)
Glucose, GTT - Fasting: 173 mg/dL — ABNORMAL HIGH (ref 65–99)

## 2019-10-27 ENCOUNTER — Telehealth (INDEPENDENT_AMBULATORY_CARE_PROVIDER_SITE_OTHER): Payer: Self-pay | Admitting: General Practice

## 2019-10-27 DIAGNOSIS — R7309 Other abnormal glucose: Secondary | ICD-10-CM

## 2019-10-27 NOTE — Telephone Encounter (Signed)
Called patient with [facility] for interpreter- informed her of results and provided [place] information as patient doesn't have PCP. Patient verbalized understanding.

## 2019-10-27 NOTE — Telephone Encounter (Signed)
-----   Message from Lavonia Drafts, MD sent at 10/23/2019  3:41 PM EST ----- Covering for Dr. Rosana Hoes.   Please call pt. Her 2 hour PP GTT is abnormal. She needs referral to primary care for further eval and management.  Thx  Clh-S

## 2019-12-29 IMAGING — US US FETAL BPP W/ NON-STRESS
1 series · 13 of 14 positions shown · non-contrast
Comparison: none

[Series 1: us fetal bpp w/nonstress · 14 acquisitions, 13 frames shown]
[im 1/14]
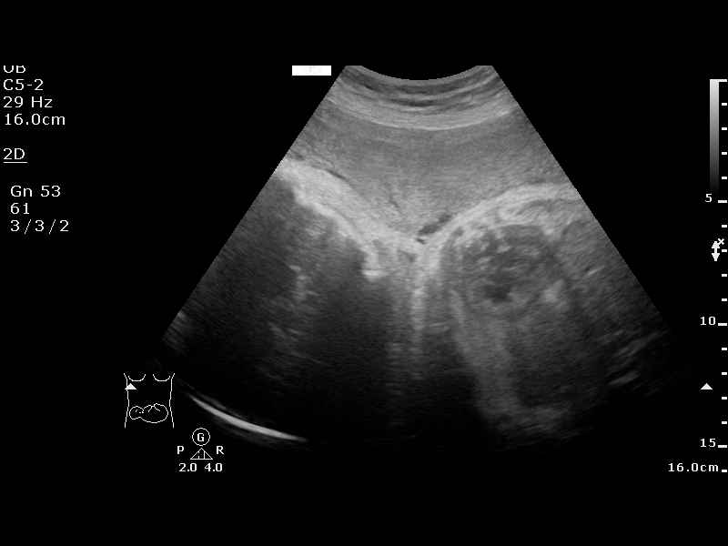
[im 2/14]
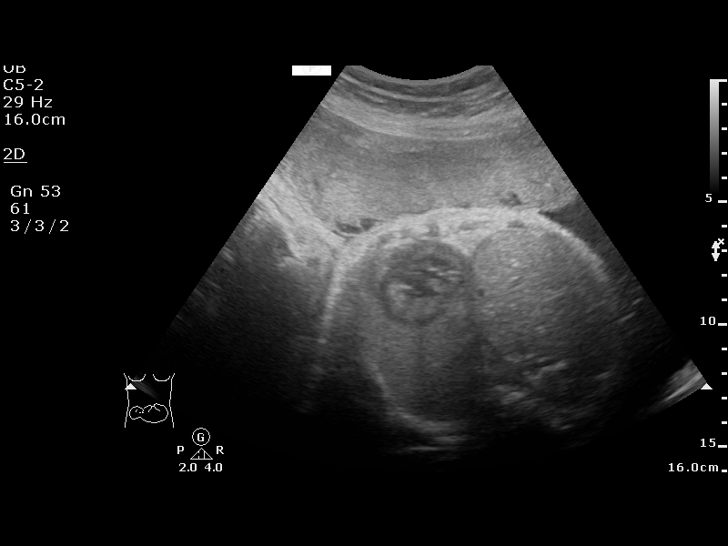
[im 3/14]
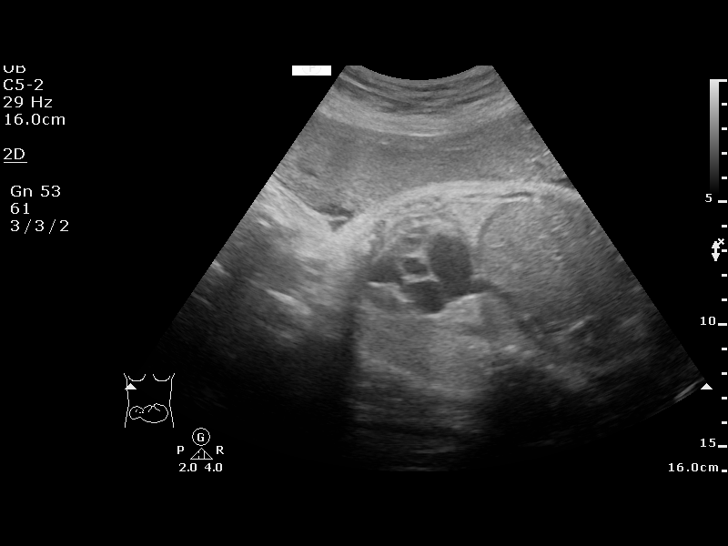
[im 4/14]
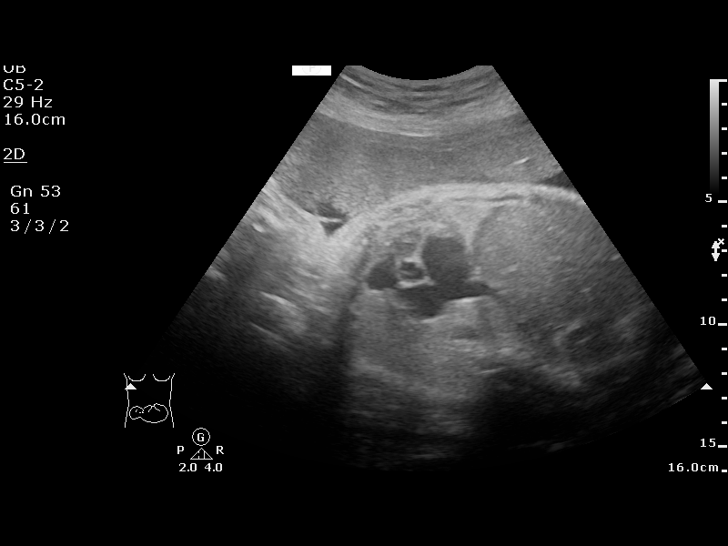
[im 5/14]
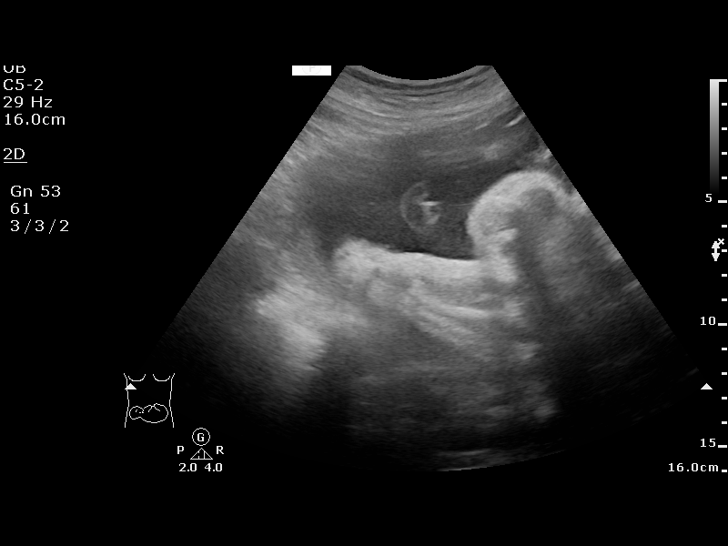
[im 6/14]
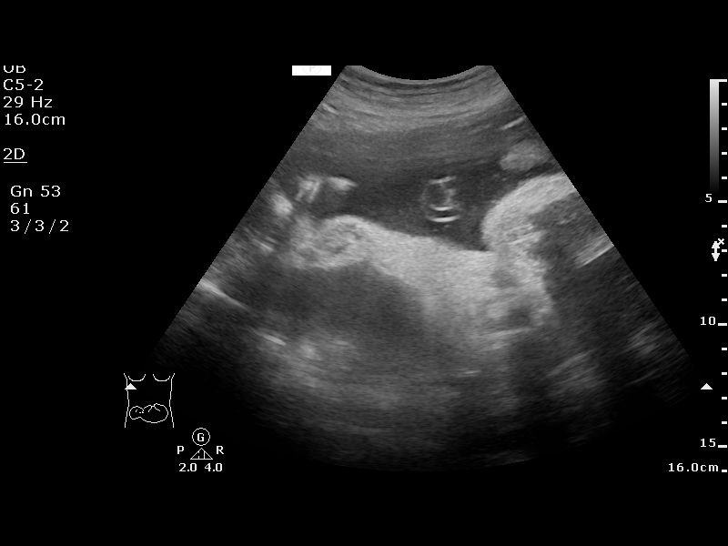
[im 8/14]
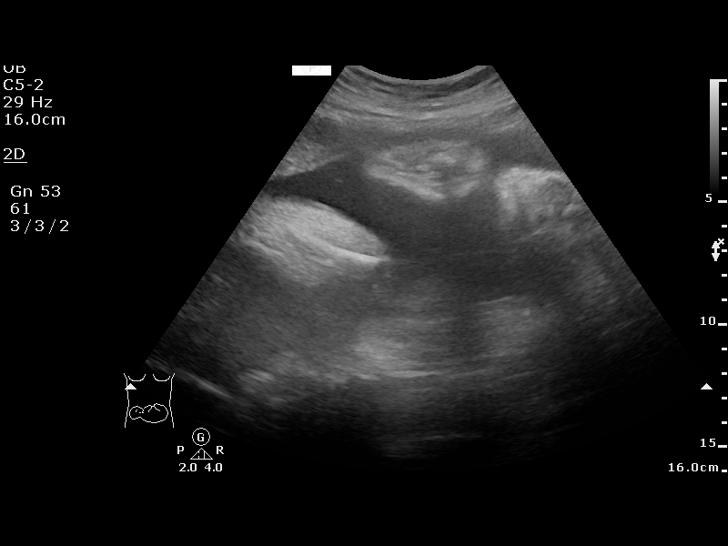
[im 9/14]
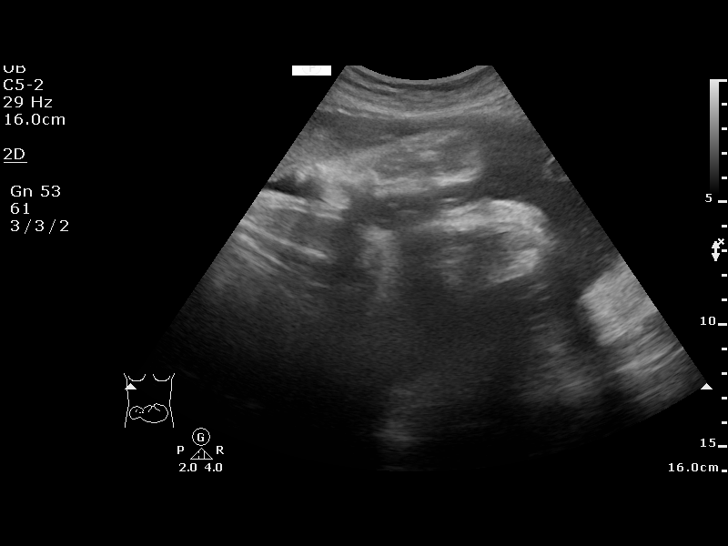
[im 10/14]
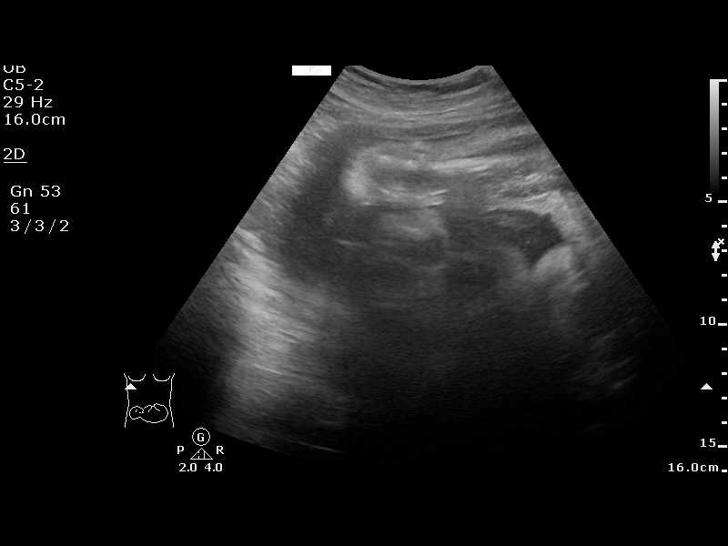
[im 11/14]
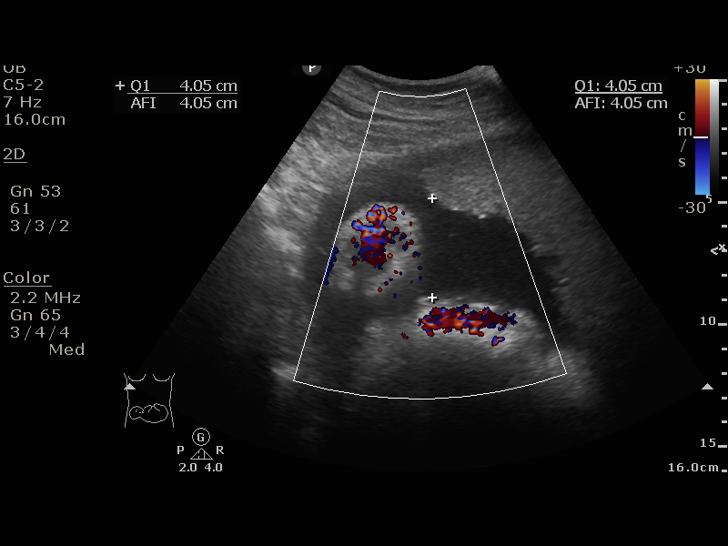
[im 12/14]
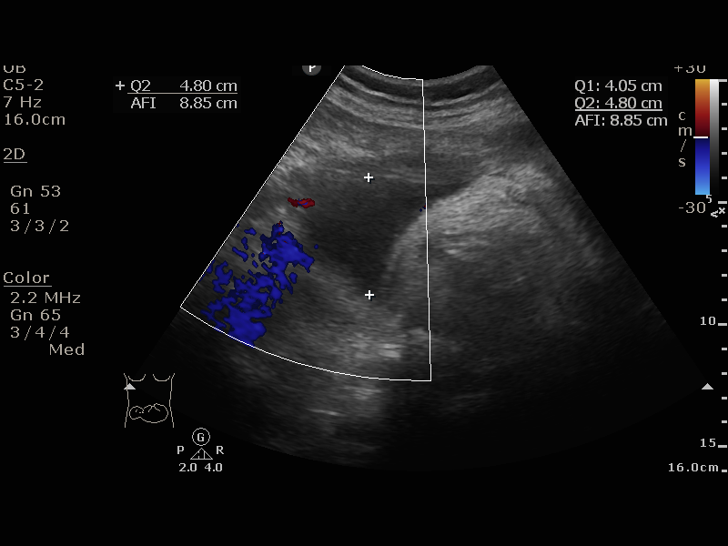
[im 13/14]
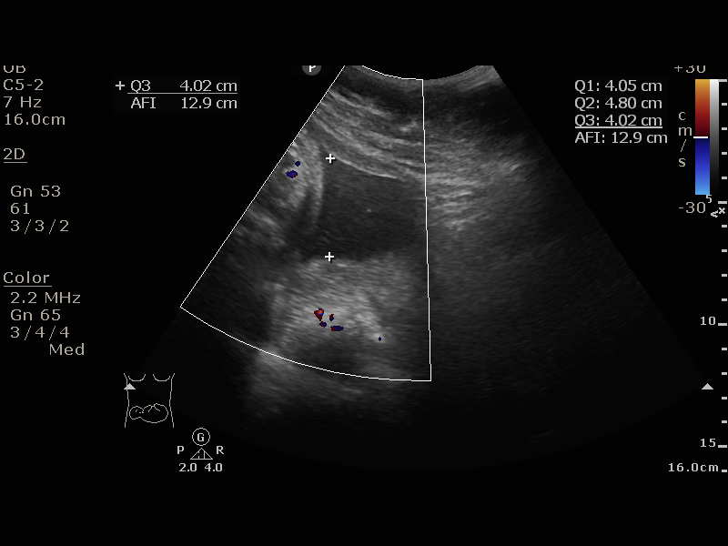
[im 14/14]
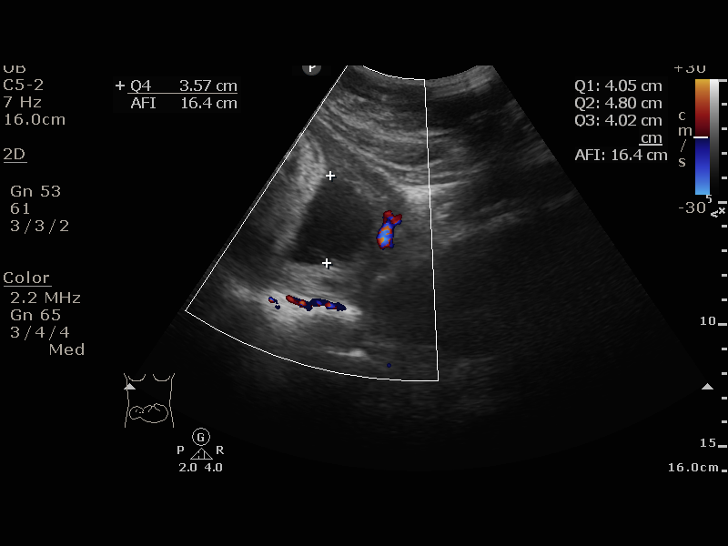

[13 of 14 positions shown; findings below may reference images not displayed]

[facility]

                                                             [address]
                                                             Women's
                                                             [REDACTED]

  1  US FETAL BPP W/NONSTRESS              [address]18.4     MARVI STATEN
 ----------------------------------------------------------------------

 ----------------------------------------------------------------------
Service(s) Provided

 ----------------------------------------------------------------------
Indications

  Weeks of gestation of pregnancy not
  specified
  Pre-existing diabetes, type 2, in pregnancy,
  third trimester
  Advanced maternal age multigravida 35+,
  third trimester
 ----------------------------------------------------------------------
Fetal Evaluation

 Num Of Fetuses:          1
 Preg. Location:          Intrauterine
 Cardiac Activity:        Observed
 Presentation:            Transverse, head to maternal right

 Amniotic Fluid
 AFI FV:      Within normal limits

 AFI Sum(cm)                 Largest Pocket(cm)


 RUQ(cm)       RLQ(cm)       LUQ(cm)        LLQ(cm)

Biophysical Evaluation

 Amniotic F.V:   Pocket => 2 cm             F. Tone:         Observed
 F. Movement:    Observed                   N.S.T:           Reactive
 F. Breathing:   Observed                   Score:           [DATE]
Impression

 Viable fetus in persistent transverse position
Recommendations

 -Continue weekly BPP till delivery.
 - Growth ultrasound ordered via health department
 - will re-evaluate fetal position and mode of delivery at 37
 weeks
                Machmud, Then J

## 2020-01-12 IMAGING — US US FETAL BPP W/ NON-STRESS
1 series · 13 of 14 positions shown · non-contrast
Comparison: none

[Series 1: us fetal bpp w/nonstress · 14 acquisitions, 13 frames shown]
[im 1/14]
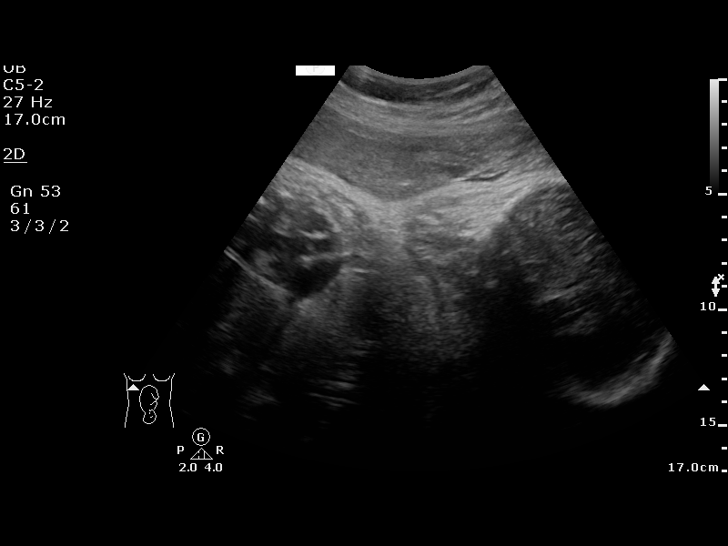
[im 2/14]
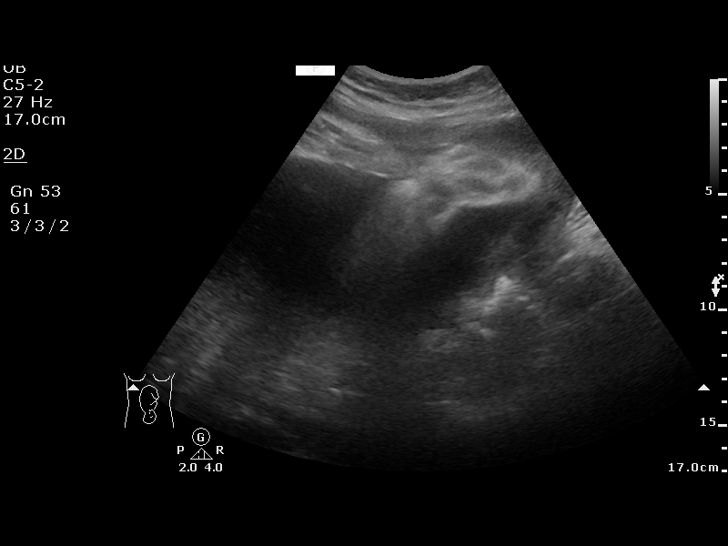
[im 3/14]
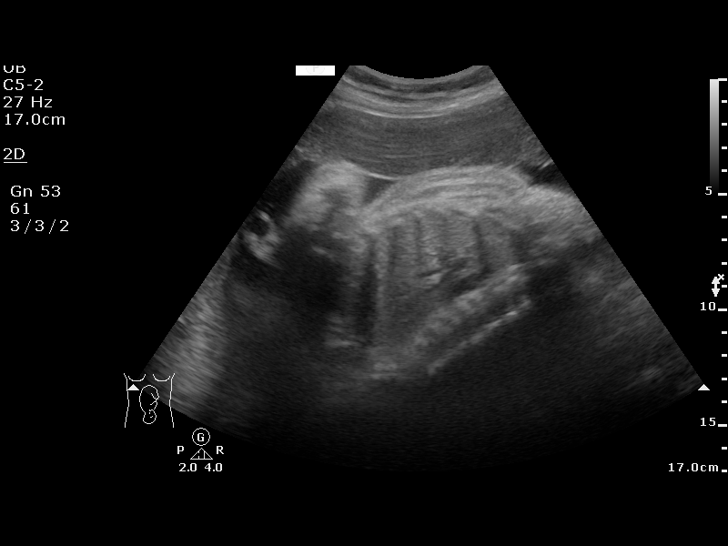
[im 4/14]
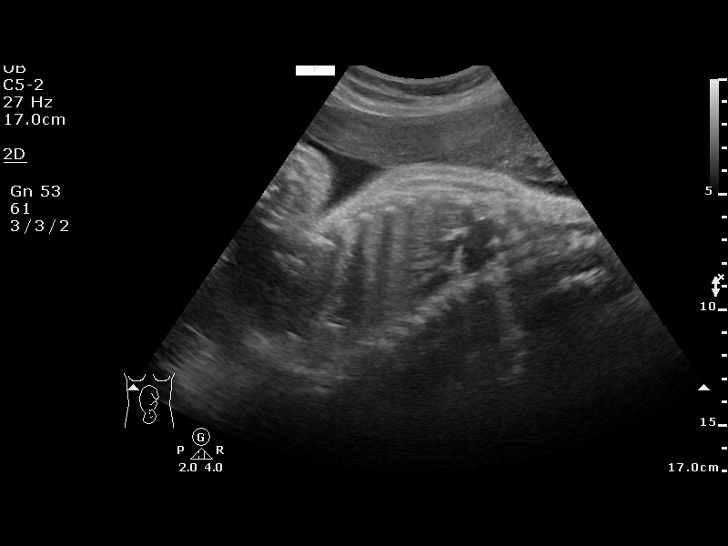
[im 5/14]
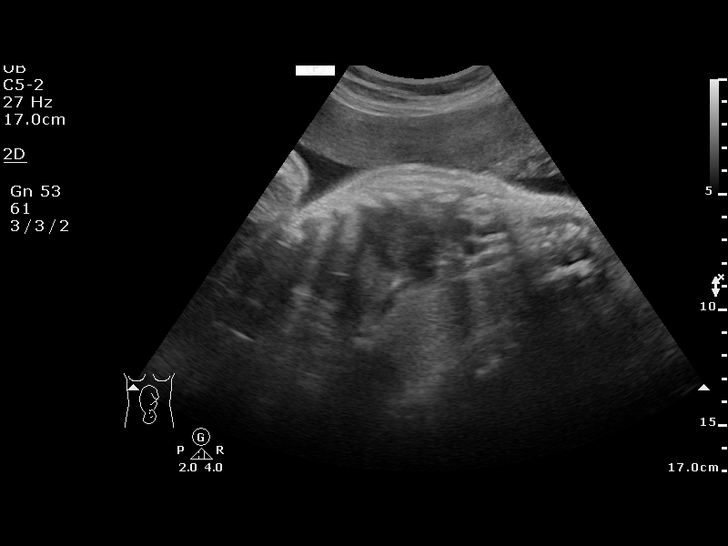
[im 6/14]
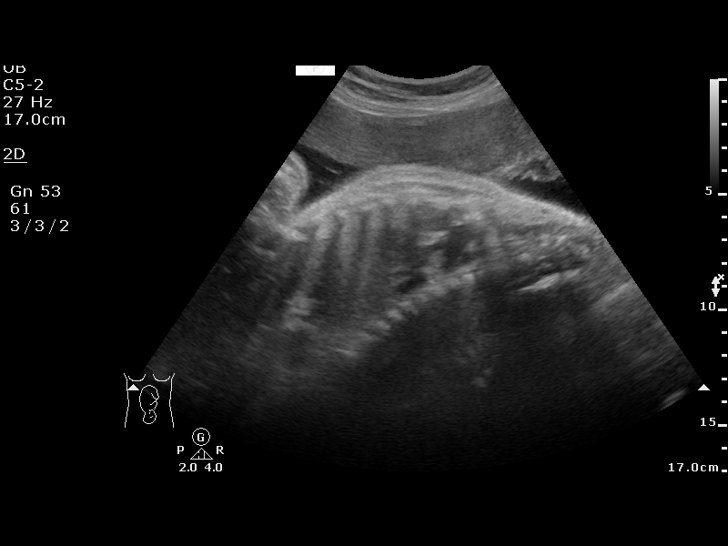
[im 8/14]
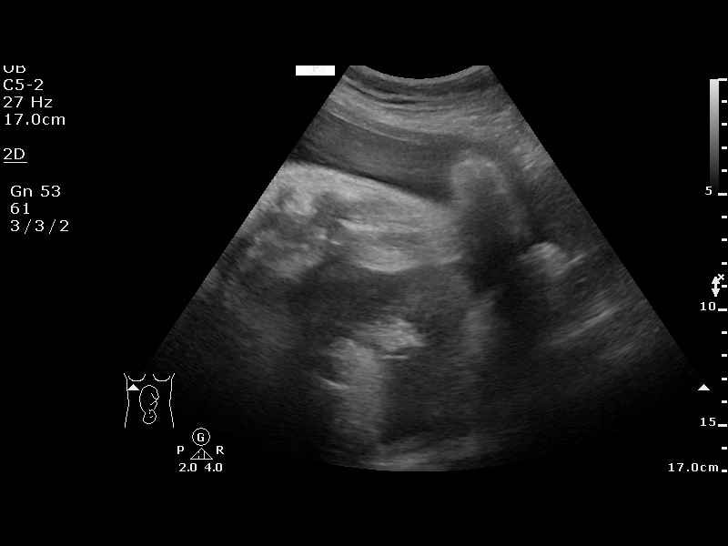
[im 9/14]
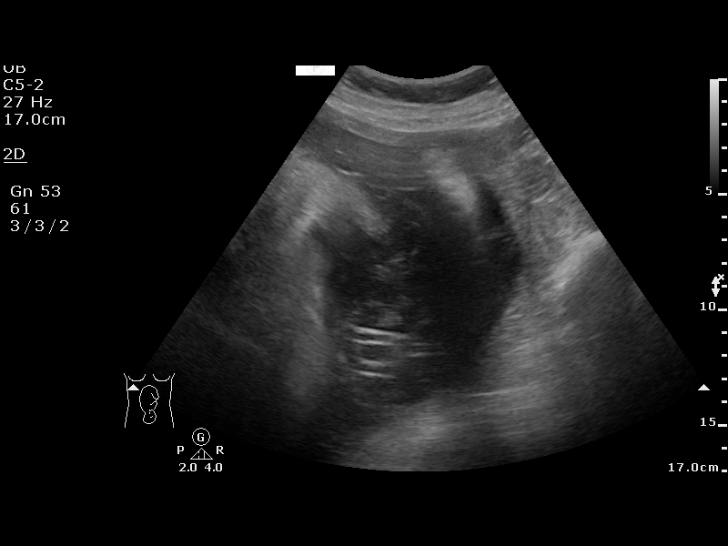
[im 10/14]
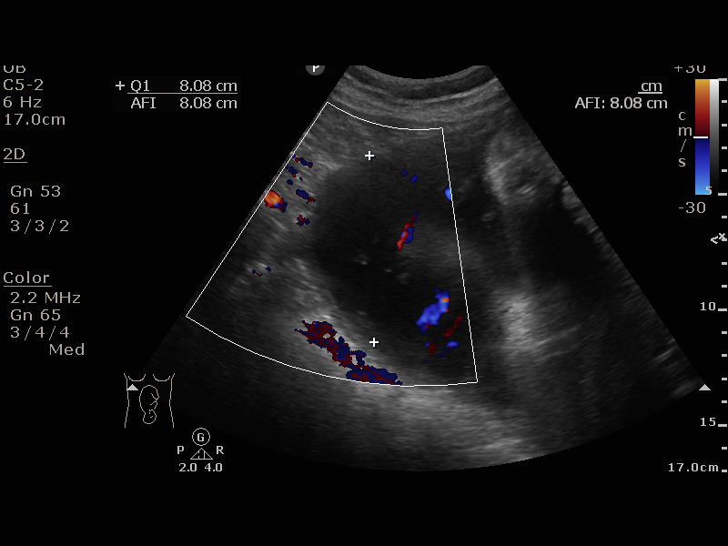
[im 11/14]
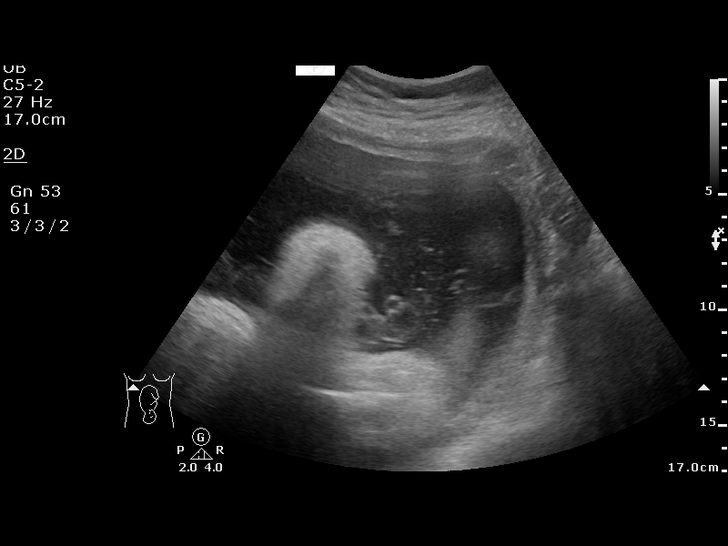
[im 12/14]
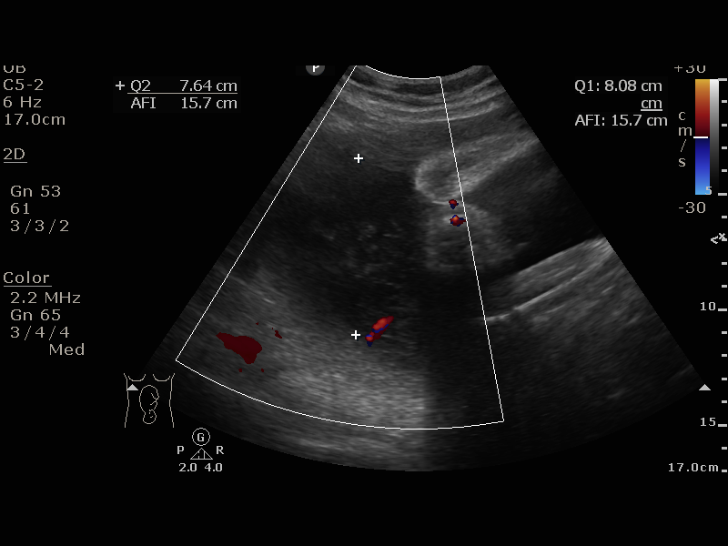
[im 13/14]
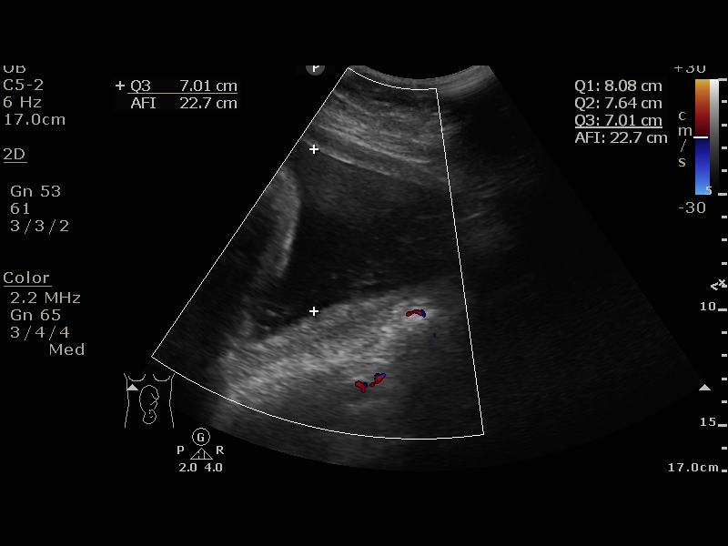
[im 14/14]
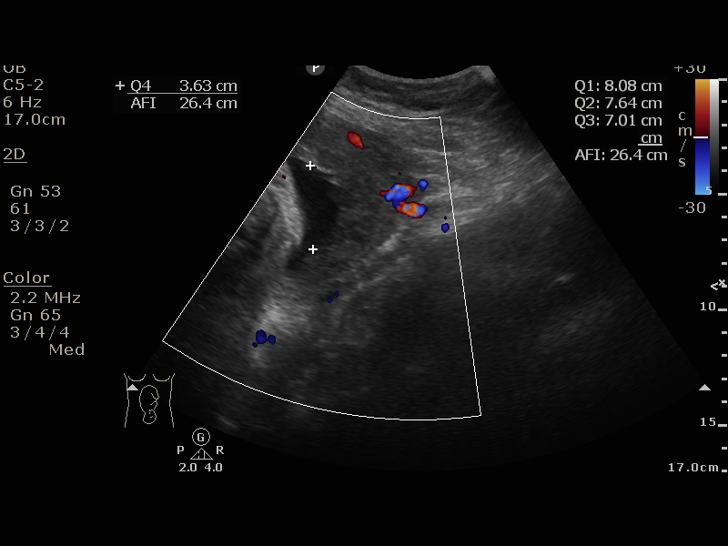

[13 of 14 positions shown; findings below may reference images not displayed]

[place]

                                                            [address]
                                                            Women's
                                                            [REDACTED]

  1  US FETAL BPP W/NONSTRESS             [address].4      LIGOR ZANELLATI
 ----------------------------------------------------------------------

 ----------------------------------------------------------------------
Service(s) Provided

 ----------------------------------------------------------------------
Indications

  Weeks of gestation of pregnancy not
  specified
  Advanced maternal age multigravida 35+,
  third trimester
  Pre-existing diabetes, type 2, in pregnancy,
  third trimester
 ----------------------------------------------------------------------
Fetal Evaluation

 Num Of Fetuses:         1
 Preg. Location:         Intrauterine
 Cardiac Activity:       Observed
 Presentation:           Cephalic

 Amniotic Fluid
 AFI FV:      Within normal limits

 AFI Sum(cm)                 Largest Pocket(cm)


 RUQ(cm)       RLQ(cm)       LUQ(cm)        LLQ(cm)

Biophysical Evaluation

 Amniotic F.V:   Pocket => 2 cm             F. Tone:        Observed
 F. Movement:    Observed                   N.S.T:          Reactive
 F. Breathing:   Observed                   Score:          [DATE]
Impression

 BPP [DATE]
Recommendations

 -Continue weekly BPP till delivery.
                  Horan, Lirong

## 2020-02-25 IMAGING — US US TRANSVAGINAL NON-OB
1 series · 13 of 25 positions shown · non-contrast
Comparison: None.

CLINICAL DATA: Pelvic pain and vaginal bleeding, postpartum

EXAM:
TRANSABDOMINAL AND TRANSVAGINAL ULTRASOUND OF PELVIS
DOPPLER ULTRASOUND OF OVARIES
TECHNIQUE: Both transabdominal and transvaginal ultrasound examinations of the
pelvis were performed. Transabdominal technique was performed for
global imaging of the pelvis including uterus, ovaries, adnexal
regions, and pelvic cul-de-sac.
It was necessary to proceed with endovaginal exam following the
transabdominal exam to visualize the . Color and duplex Doppler
ultrasound was utilized to evaluate blood flow to the ovaries.

[Series 1: us transvaginal non-ob · 13 of 110 slices shown]
[im 1/110]
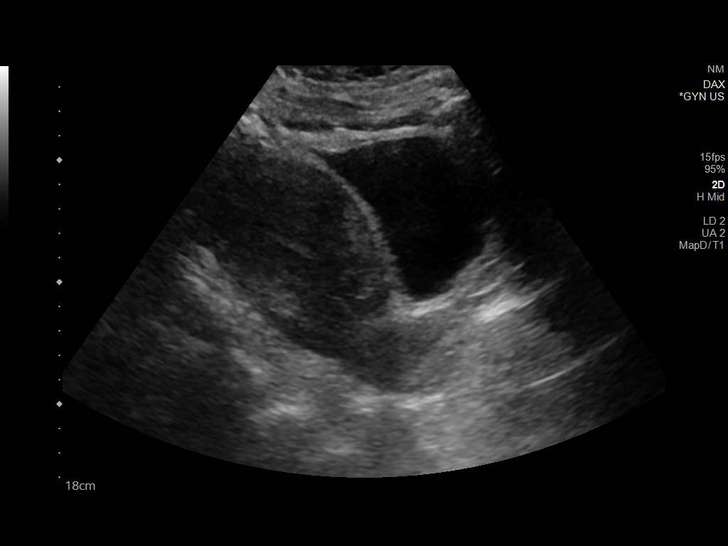
[im 10/110]
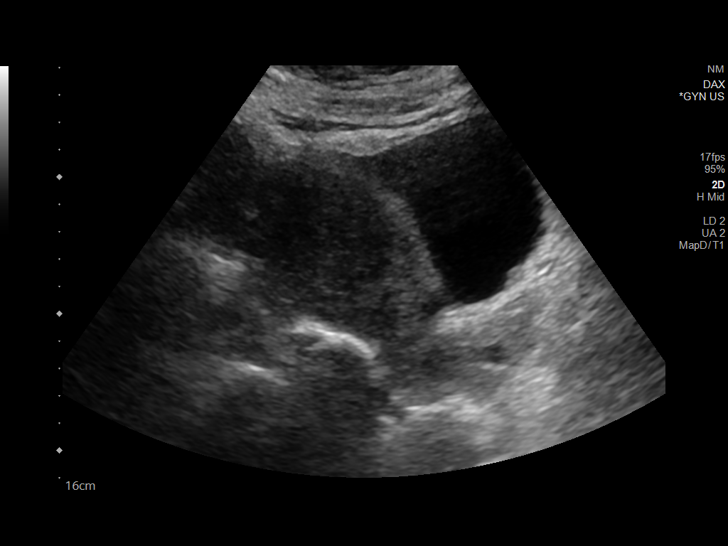
[im 19/110]
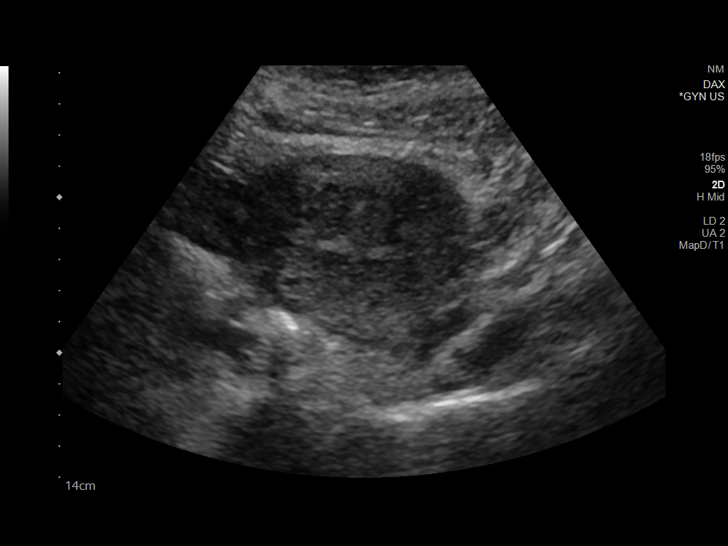
[im 28/110]
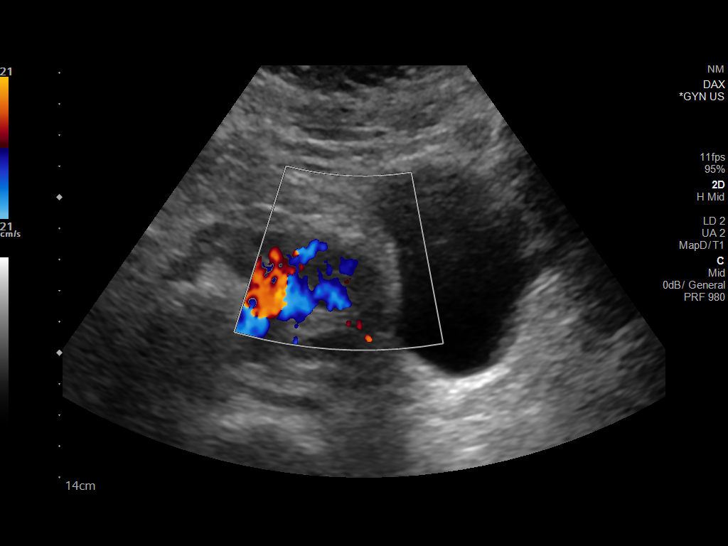
[im 37/110]
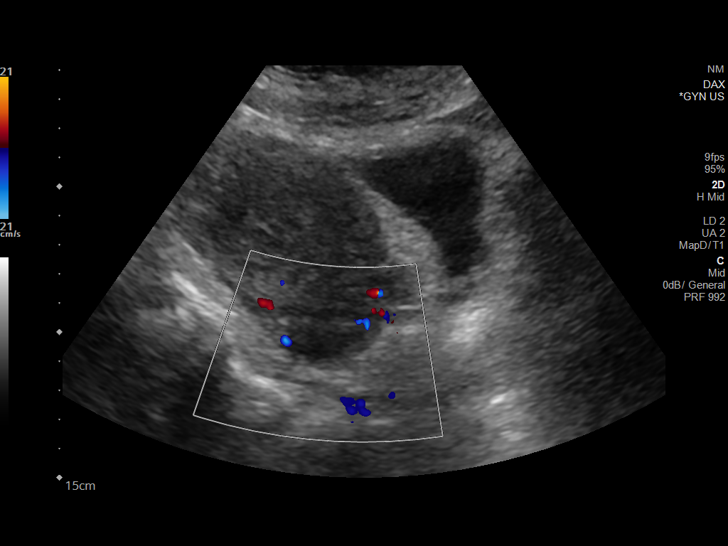
[im 46/110]
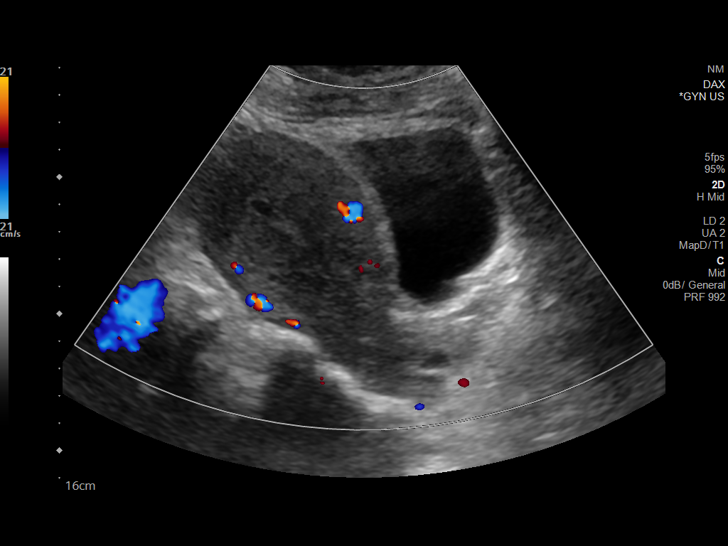
[im 55/110]
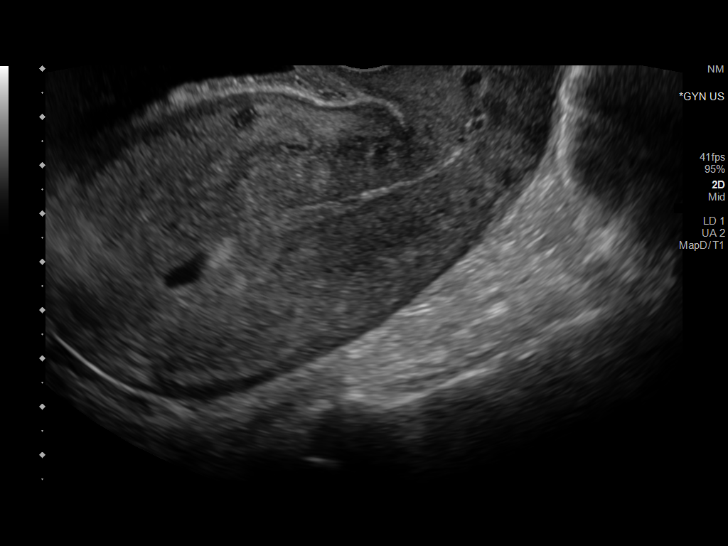
[im 64/110]
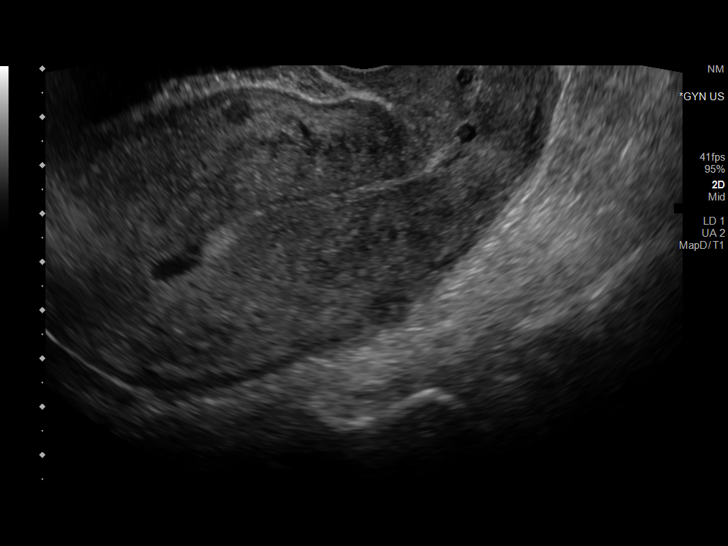
[im 73/110]
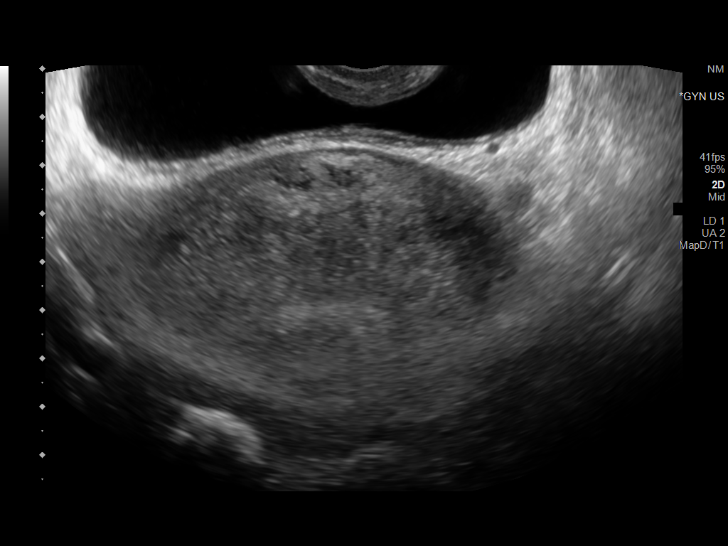
[im 82/110]
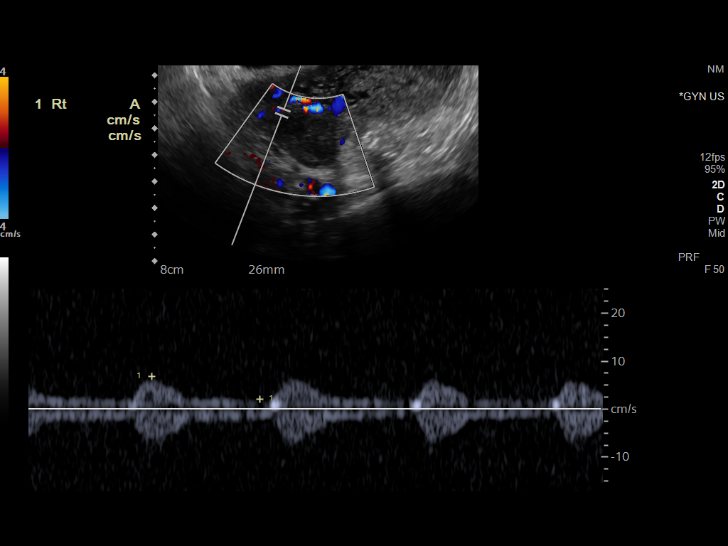
[im 91/110]
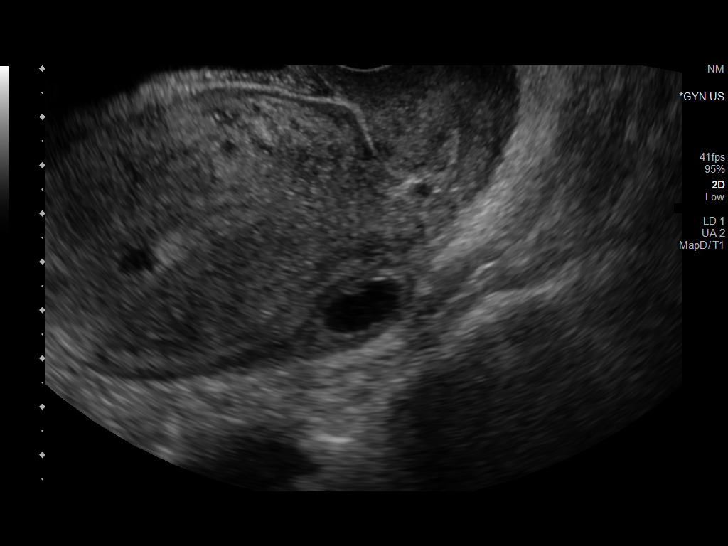
[im 100/110]
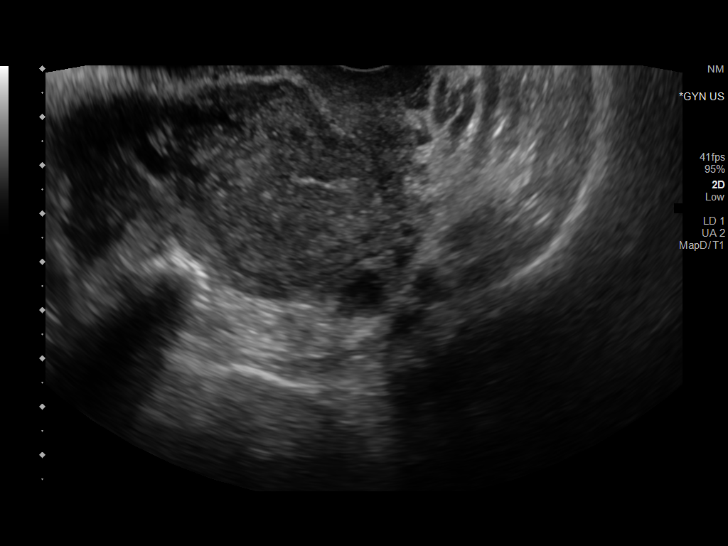
[im 110/110]
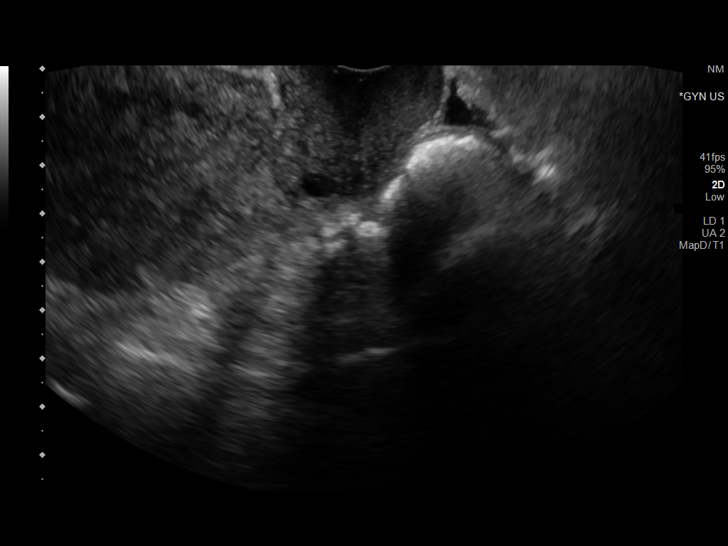

[13 of 25 positions shown; findings below may reference images not displayed]

FINDINGS: Uterus

Measurements: 11.3 x 5.8 x 6.8 cm = volume: 234.5 cm is mL. No
fibroids or other mass visualized.

Endometrium

Thickness: . 6 mm there is fluid and small amount of echogenic
material seen swirling within the endometrial canal.

Right ovary

Measurements: 2.9 x 2.0 x 1.7 cm = volume: 4.9 mL. Normal
appearance/no adnexal mass.

Left ovary

Measurements: 3.4 x 1.4 x 1.9 cm = volume: 4.6 mL. Normal
appearance/no adnexal mass.

Pulsed Doppler evaluation of both ovaries demonstrates normal
low-resistance arterial and venous waveforms.

Other findings

No abnormal free fluid.
IMPRESSION: Small amount of echogenic fluid/material within the endometrial
canal which is nonspecific, may be due to blood clot within the
endometrial, less likely thought to be small amount of retained
products.

## 2020-04-05 ENCOUNTER — Ambulatory Visit: Payer: Self-pay | Admitting: Internal Medicine

## 2020-06-04 ENCOUNTER — Other Ambulatory Visit: Payer: Self-pay

## 2020-06-04 ENCOUNTER — Ambulatory Visit: Payer: Self-pay | Attending: Internal Medicine | Admitting: Internal Medicine

## 2020-06-04 ENCOUNTER — Other Ambulatory Visit: Payer: Self-pay | Admitting: Internal Medicine

## 2020-06-04 DIAGNOSIS — Z23 Encounter for immunization: Secondary | ICD-10-CM

## 2020-06-04 DIAGNOSIS — E669 Obesity, unspecified: Secondary | ICD-10-CM

## 2020-06-04 DIAGNOSIS — E1169 Type 2 diabetes mellitus with other specified complication: Secondary | ICD-10-CM

## 2020-06-04 DIAGNOSIS — Z7689 Persons encountering health services in other specified circumstances: Secondary | ICD-10-CM

## 2020-06-04 DIAGNOSIS — E119 Type 2 diabetes mellitus without complications: Secondary | ICD-10-CM | POA: Insufficient documentation

## 2020-06-04 MED ORDER — TRUEPLUS LANCETS 28G MISC: 4 refills | Status: DC

## 2020-06-04 MED ORDER — TRUE METRIX METER W/DEVICE KIT: PACK | 0 refills | Status: DC

## 2020-06-04 MED ORDER — TRUE METRIX BLOOD GLUCOSE TEST VI STRP: ORAL_STRIP | 12 refills | Status: DC

## 2020-06-04 MED FILL — TRUEplus LANCETS 28G MISC: 25 days supply | Qty: 100 | Fill #0

## 2020-06-04 MED FILL — !TRUE METRIX BLOOD GLUCOSE: 30 days supply | Qty: 1 | Fill #0

## 2020-06-04 NOTE — Progress Notes (Signed)
Virtual Visit via Telephone Note Due to current restrictions/limitations of in-office visits due to the COVID-19 pandemic, this scheduled clinical appointment was converted to a telehealth visit  I connected with Morgan Ramsey on 06/04/20 at 11:13 a.m by telephone and verified that I am speaking with the correct person using two identifiers. I am in my office.  The patient is at home Only the patient, CMA Sallyanne Havers, myself and Jesus from [employer] [phone])       participated in this encounter.  Location: Patient: home Provider: Office at [facility]   I discussed the limitations, risks, security and privacy concerns of performing an evaluation and management service by telephone and the availability of in person appointments. I also discussed with the patient that there may be a patient responsible charge related to this service. The patient expressed understanding and agreed to proceed.   History of Present Illness: Patient with history of obesity, anemia, transient hypertension during pregnancy, DM.  No previous PCP.  Pt reports hx of DM x 2 yrs even before last pregnancy.  Wants to have her DM check. -reports being on Metformin 500 for a period.  This was stopped during last pregnancy and placed on insulin.  Currently not on any medications x 9 mths. -has a glucometer but no stripes Endorses blurred vision at times, polyuria, polydipsia.  No sure of wgh loss. Tries to eat healthy - incorporating fruits and veggies in diet.  Not getting in much exercise She is breast feeding  Outpatient Encounter Medications as of 06/04/2020  Medication Sig Note  . cephALEXin (KEFLEX) 500 MG capsule 1 cap po bid x 7 days (Patient not taking: Reported on 06/04/2020)   . ferrous sulfate 325 (65 FE) MG tablet Take 325 mg by mouth daily with breakfast. (Patient not taking: Reported on 06/04/2020) 08/06/2019: Pt states she is taking 1 tablet twice daily  . Hydrocortisone Acetate 1 % OINT Apply 1  application topically 2 (two) times daily. (Patient not taking: Reported on 09/18/2019)   . ibuprofen (ADVIL) 600 MG tablet Take 1 tablet (600 mg total) by mouth every 6 (six) hours. (Patient not taking: Reported on 06/04/2020)   . medroxyPROGESTERone (PROVERA) 5 MG tablet Take 2 tablets (10 mg total) by mouth every 8 (eight) hours for 5 days.   . polyethylene glycol (MIRALAX / GLYCOLAX) 17 g packet Take 17 g by mouth 2 (two) times daily. (Patient not taking: Reported on 06/04/2020)   . Prenatal Vit-Fe Fumarate-FA (PRENATAL MULTIVITAMIN) TABS tablet Take 1 tablet by mouth daily at 12 noon. (Patient not taking: Reported on 06/04/2020)    No facility-administered encounter medications on file as of 06/04/2020.      Observations/Objective: No direct observation done as this was a telephone encounter.  Assessment and Plan: 1. Encounter to establish care   2. Type 2 diabetes mellitus with obesity ([place]) Encourage healthy eating habits. Encourage regular exercise at least 3 to 4 days a week. She will come to the lab next week to have baseline blood test done including an A1c.  Further management will be based upon results.  We will get her in for an in person appointment within a month. Prescription sent to the pharmacy for diabetic testing supplies.  Advised to check blood sugars at least once a day before breakfast.  Blood sugar goal before meals is 90-130. - CBC; Future - Comprehensive metabolic panel; Future - Hemoglobin A1c; Future - Lipid panel; Future - Microalbumin / creatinine urine ratio; Future - glucose  blood (TRUE METRIX BLOOD GLUCOSE TEST) test strip; Use as instructed  Dispense: 100 each; Refill: 12 - Blood Glucose Monitoring Suppl (TRUE METRIX METER) w/Device KIT; Use as directed  Dispense: 1 kit; Refill: 0 - TRUEplus Lancets 28G MISC; Use as directed  Dispense: 100 each; Refill: 4  3. Need for influenza vaccination Patient agreeable to receiving the flu shot.  She will come to  see the clinical pharmacist next week to get that shot.   Follow Up Instructions: 1 month.   I discussed the assessment and treatment plan with the patient. The patient was provided an opportunity to ask questions and all were answered. The patient agreed with the plan and demonstrated an understanding of the instructions.   The patient was advised to call back or seek an in-person evaluation if the symptoms worsen or if the condition fails to improve as anticipated.  I provided 20 minutes of non-face-to-face time during this encounter.   Karle Plumber, MD

## 2020-06-07 ENCOUNTER — Telehealth: Payer: Self-pay

## 2020-06-07 ENCOUNTER — Other Ambulatory Visit: Payer: Self-pay

## 2020-06-07 ENCOUNTER — Ambulatory Visit: Payer: Self-pay | Attending: Family Medicine

## 2020-06-07 DIAGNOSIS — E669 Obesity, unspecified: Secondary | ICD-10-CM

## 2020-06-07 DIAGNOSIS — E1169 Type 2 diabetes mellitus with other specified complication: Secondary | ICD-10-CM

## 2020-06-07 MED FILL — TRUE METRIX TEST STRIP: 25 days supply | Qty: 100 | Fill #0

## 2020-06-07 NOTE — Telephone Encounter (Signed)
Copied from [place] [id number]. Topic: General - Other >> Jun 03, 2020  1:30 PM Alanda Slim E wrote: Reason for CRM:  Pt would like an appt with Clifton James for orange card/ please advise

## 2020-06-08 ENCOUNTER — Telehealth: Payer: Self-pay | Admitting: Internal Medicine

## 2020-06-08 LAB — HEMOGLOBIN A1C
Est. average glucose Bld gHb Est-mCnc: 286 mg/dL
Hgb A1c MFr Bld: 11.6 % — ABNORMAL HIGH (ref 4.8–5.6)

## 2020-06-08 LAB — LIPID PANEL
Chol/HDL Ratio: 3.7 ratio (ref 0.0–4.4)
Cholesterol, Total: 183 mg/dL (ref 100–199)
HDL: 49 mg/dL (ref >39)
LDL Chol Calc (NIH): 111 mg/dL — ABNORMAL HIGH (ref 0–99)
Triglycerides: 131 mg/dL (ref 0–149)
VLDL Cholesterol Cal: 23 mg/dL (ref 5–40)

## 2020-06-08 LAB — MICROALBUMIN / CREATININE URINE RATIO
Creatinine, Urine: 128.5 mg/dL
Microalb/Creat Ratio: 128 mg/g creat — ABNORMAL HIGH (ref 0–29)
Microalbumin, Urine: 164 ug/mL

## 2020-06-08 LAB — COMPREHENSIVE METABOLIC PANEL
ALT: 18 IU/L (ref 0–32)
AST: 14 IU/L (ref 0–40)
Albumin/Globulin Ratio: 1.3 (ref 1.2–2.2)
Albumin: 4.3 g/dL (ref 3.8–4.8)
Alkaline Phosphatase: 143 IU/L — ABNORMAL HIGH (ref 44–121)
BUN/Creatinine Ratio: 16 (ref 9–23)
BUN: 10 mg/dL (ref 6–24)
Bilirubin Total: 0.3 mg/dL (ref 0.0–1.2)
CO2: 23 mmol/L (ref 20–29)
Calcium: 8.9 mg/dL (ref 8.7–10.2)
Chloride: 97 mmol/L (ref 96–106)
Creatinine, Ser: 0.61 mg/dL (ref 0.57–1.00)
GFR calc Af Amer: 127 mL/min/{1.73_m2} (ref >59)
GFR calc non Af Amer: 110 mL/min/{1.73_m2} (ref >59)
Globulin, Total: 3.3 g/dL (ref 1.5–4.5)
Glucose: 284 mg/dL — ABNORMAL HIGH (ref 65–99)
Potassium: 3.8 mmol/L (ref 3.5–5.2)
Sodium: 136 mmol/L (ref 134–144)
Total Protein: 7.6 g/dL (ref 6.0–8.5)

## 2020-06-08 LAB — CBC
Hematocrit: 40.8 % (ref 34.0–46.6)
Hemoglobin: 12.9 g/dL (ref 11.1–15.9)
MCH: 24.2 pg — ABNORMAL LOW (ref 26.6–33.0)
MCHC: 31.6 g/dL (ref 31.5–35.7)
MCV: 76 fL — ABNORMAL LOW (ref 79–97)
Platelets: 268 10*3/uL (ref 150–450)
RBC: 5.34 x10E6/uL — ABNORMAL HIGH (ref 3.77–5.28)
RDW: 13.3 % (ref 11.7–15.4)
WBC: 6.4 10*3/uL (ref 3.4–10.8)

## 2020-06-08 NOTE — Telephone Encounter (Signed)
I return Pt call, she does not want to apply for the CAFA promgram since she does not have any bill according to her

## 2020-06-09 ENCOUNTER — Other Ambulatory Visit: Payer: Self-pay | Admitting: Internal Medicine

## 2020-06-09 DIAGNOSIS — Z5309 Procedure and treatment not carried out because of other contraindication: Secondary | ICD-10-CM

## 2020-06-09 DIAGNOSIS — E1169 Type 2 diabetes mellitus with other specified complication: Secondary | ICD-10-CM | POA: Insufficient documentation

## 2020-06-09 DIAGNOSIS — E1129 Type 2 diabetes mellitus with other diabetic kidney complication: Secondary | ICD-10-CM

## 2020-06-09 MED ORDER — METFORMIN HCL 500 MG PO TABS: 500.0000 mg | ORAL_TABLET | Freq: Two times a day (BID) | ORAL | 3 refills | Status: DC

## 2020-06-09 MED ORDER — PEN NEEDLES 31G X 8 MM MISC: 6 refills | Status: DC

## 2020-06-09 MED ORDER — LANTUS SOLOSTAR 100 UNIT/ML ~~LOC~~ SOPN: 10.0000 [IU] | PEN_INJECTOR | Freq: Every day | SUBCUTANEOUS | 99 refills | Status: DC

## 2020-06-09 MED FILL — METFORMIN HCL 500 MG TABS: 500 | 30 days supply | Qty: 60 | Fill #0

## 2020-06-09 MED FILL — TRUEPLUS PEN NDL 31GX5/16: 31G X 8 MM | 90 days supply | Qty: 100 | Fill #0

## 2020-06-09 MED FILL — !LANTUS SOLOSTAR 100UNITS/M: 100 | 30 days supply | Qty: 3 | Fill #0

## 2020-06-09 NOTE — Progress Notes (Signed)
Let patient know that her A1c is 11.6 meaning that her diabetes is poorly controlled.  I recommend that we put her back on once daily insulin called Lantus along with Metformin.  Please schedule her an appointment with Lurena Joiner this week for insulin teaching and to be shown how to use the Lantus pen.  I have sent the rxns to our pharmacy.  She should check her blood sugars at least twice a day before meals and bring in readings when she comes to see me.  Please schedule her a 1 month follow-up appointment with me in person.  Blood sugar goal before meals is 90-130.  Her blood cell count was okay meaning no anemia.  Kidney function normal.  LDL cholesterol elevated at 111 with goal being less than 70.  Healthy eating habits and regular exercise will help to lower cholesterol and also help with control of diabetes.  I will hold off on prescribing cholesterol medication because it is contraindicated in breast-feeding women.  She also has protein in the urine which can be an early indication of diabetes affecting the kidneys.  The medications that we would normally use to help slow down this process is also not indicated in breast-feeding women.  We can prescribe once she is no longer breast-feeding.

## 2020-06-10 ENCOUNTER — Other Ambulatory Visit: Payer: Self-pay

## 2020-06-10 ENCOUNTER — Ambulatory Visit: Payer: Self-pay | Attending: Internal Medicine | Admitting: Pharmacist

## 2020-06-10 ENCOUNTER — Encounter: Payer: Self-pay | Admitting: Pharmacist

## 2020-06-10 DIAGNOSIS — E1169 Type 2 diabetes mellitus with other specified complication: Secondary | ICD-10-CM

## 2020-06-10 DIAGNOSIS — E669 Obesity, unspecified: Secondary | ICD-10-CM

## 2020-06-10 NOTE — Progress Notes (Signed)
Patient was educated on the use of the Lantus. Reviewed necessary supplies and operation of the pen. Also reviewed goal blood glucose levels. Patient was able to demonstrate use. All questions and concerns were addressed.  Benard Halsted, PharmD, [place] [phone]

## 2020-07-16 ENCOUNTER — Encounter: Payer: Self-pay | Admitting: Internal Medicine

## 2020-07-16 ENCOUNTER — Ambulatory Visit: Payer: Self-pay | Attending: Internal Medicine | Admitting: Internal Medicine

## 2020-07-16 ENCOUNTER — Other Ambulatory Visit: Payer: Self-pay | Admitting: Internal Medicine

## 2020-07-16 ENCOUNTER — Other Ambulatory Visit: Payer: Self-pay

## 2020-07-16 VITALS — BP 128/73 | HR 81 | Temp 98.2°F | Resp 16 | Wt 205.4 lb

## 2020-07-16 DIAGNOSIS — E1129 Type 2 diabetes mellitus with other diabetic kidney complication: Secondary | ICD-10-CM

## 2020-07-16 DIAGNOSIS — E669 Obesity, unspecified: Secondary | ICD-10-CM

## 2020-07-16 DIAGNOSIS — E1169 Type 2 diabetes mellitus with other specified complication: Secondary | ICD-10-CM

## 2020-07-16 DIAGNOSIS — Z2821 Immunization not carried out because of patient refusal: Secondary | ICD-10-CM

## 2020-07-16 DIAGNOSIS — Z5309 Procedure and treatment not carried out because of other contraindication: Secondary | ICD-10-CM

## 2020-07-16 DIAGNOSIS — R809 Proteinuria, unspecified: Secondary | ICD-10-CM

## 2020-07-16 DIAGNOSIS — L309 Dermatitis, unspecified: Secondary | ICD-10-CM

## 2020-07-16 DIAGNOSIS — E785 Hyperlipidemia, unspecified: Secondary | ICD-10-CM

## 2020-07-16 LAB — GLUCOSE, POCT (MANUAL RESULT ENTRY): POC Glucose: 220 mg/dl — AB (ref 70–99)

## 2020-07-16 MED ORDER — MICONAZOLE NITRATE 2 % EX CREA: TOPICAL_CREAM | CUTANEOUS | 0 refills | Status: DC

## 2020-07-16 MED ORDER — LANTUS SOLOSTAR 100 UNIT/ML ~~LOC~~ SOPN: 12.0000 [IU] | PEN_INJECTOR | Freq: Every day | SUBCUTANEOUS | 11 refills | Status: DC

## 2020-07-16 MED ORDER — METFORMIN HCL 1000 MG PO TABS: 1000.0000 mg | ORAL_TABLET | Freq: Two times a day (BID) | ORAL | 6 refills | Status: DC

## 2020-07-16 MED FILL — TRUEplus LANCETS 28G MISC: 25 days supply | Qty: 100 | Fill #1

## 2020-07-16 MED FILL — METFORMIN HCL 1000 MG TABS: 1000 | 30 days supply | Qty: 60 | Fill #0

## 2020-07-16 MED FILL — ?BASAGLAR 100 UNITS/ML KWPE: 100 | 25 days supply | Qty: 3 | Fill #0

## 2020-07-16 MED FILL — TRUE METRIX TEST STRIP: 25 days supply | Qty: 100 | Fill #1

## 2020-07-16 NOTE — Patient Instructions (Signed)
Diabetes mellitus y nutricin, en adultos Diabetes Mellitus and Nutrition, Adult Si sufre de diabetes (diabetes mellitus), es muy importante tener hbitos alimenticios saludables debido a que sus niveles de Designer, television/film set sangre (glucosa) se ven afectados en gran medida por lo que come y bebe. [place] alimentos saludables en las cantidades [place], aproximadamente a la [employer], [place] ayudar a:  Aeronautical engineer glucemia.  Disminuir el riesgo de sufrir una enfermedad cardaca.  Mejorar la presin arterial.  Science writer o mantener un peso saludable. Todas las personas que sufren de diabetes son diferentes y cada una tiene necesidades diferentes en cuanto a un plan de alimentacin. El mdico puede recomendarle que trabaje con un especialista en dietas y nutricin (nutricionista) para Financial trader plan para usted. Su plan de alimentacin puede variar segn factores como:  Las caloras que necesita.  Los medicamentos que toma.  Su peso.  Sus niveles de glucemia, presin arterial y colesterol.  Su nivel de [place].  Otras afecciones que tenga, como enfermedades cardacas o renales. Cmo me afectan los carbohidratos? Los carbohidratos, o hidratos de carbono, afectan su nivel de glucemia ms que cualquier otro tipo de alimento. La ingesta de carbohidratos naturalmente aumenta la cantidad de [employer]. El recuento de carbohidratos es un mtodo destinado a Catering manager un registro de la cantidad de carbohidratos que se consumen. El recuento de carbohidratos es importante para Theatre manager la glucemia a un nivel saludable, especialmente si utiliza insulina o toma determinados medicamentos por va oral para la diabetes. Es importante conocer la cantidad de carbohidratos que se pueden ingerir en cada comida sin correr Engineer, manufacturing. Esto es Psychologist, forensic. Su nutricionista puede ayudarlo a calcular la cantidad de carbohidratos que debe ingerir en cada comida y en cada  refrigerio. Entre los alimentos que contienen carbohidratos, se incluyen:  Pan, cereal, arroz, pastas y galletas.  Papas y maz.  Guisantes, frijoles y lentejas.  Leche y Estate agent.  Lambert Mody y [place].  Postres, como pasteles, galletas, helado y caramelos. Cmo me afecta el alcohol? El alcohol puede provocar disminuciones sbitas de la glucemia (hipoglucemia), especialmente si utiliza insulina o toma determinados medicamentos por va oral para la diabetes. La hipoglucemia es una afeccin potencialmente mortal. Los sntomas de la hipoglucemia (somnolencia, mareos y confusin) son similares a los sntomas de haber consumido demasiado alcohol. Si el mdico afirma que el alcohol es seguro para usted, [place] estas pautas:  Limite el consumo de alcohol a no ms de 26mdida por da si es mujer y no est [place] y a 219midas si es hombre. Una medida equivale a 12oz (35564mde cerveza, 5oz (148m60me vino o 1oz (44ml75m bebidas alcohlicas de alta graduacin.  No beba con el estmago vaco.  Mantngase hidratado bebiendo agua, refrescos dietticos o t helado sin azcar.  Tenga en cuenta que los refrescos comunes, los jugos y otras bebida para mezclOptician, dispensingen contener mucha azcar y se deben contar como carbohidratos. Cules son algunos consejos para seguir este plan?  Leer las etiquetas de los alimentos  Comience por leer el tamao de la porcin en la "Informacin nutricional" en las etiquetas de los alimentos envasados y las bebidas. La cantidad de caloras, carbohidratos, grasas y otros nutrientes mencionados en la etiqueta se basan en una porcin del alimento. Muchos alimentos contienen ms de una porcin por envase.  Verifique la cantidad total de gramos (g) de carbohidratos totales en una porcin. Puede calcular la cantidad de porciones de carbohidratos al dividir el  total de carbohidratos por 15. Por ejemplo, si un alimento tiene un total de 30g de carbohidratos, equivale a 2  porciones de carbohidratos.  Verifique la cantidad de gramos (g) de grasas saturadas y grasas trans en una porcin. Escoja alimentos que no contengan grasa o que tengan un bajo contenido.  Verifique la cantidad de miligramos (mg) de sal (sodio) en una porcin. [place] [employer] de las personas deben limitar la ingesta de sodio total a menos de 2325m por dTraining and development officer  Siempre consulte la informacin nutricional de los alimentos etiquetados como "con bajo contenido de grasa" o "sin grasa". Estos alimentos pueden tener un mayor contenido de aLocation manageragregada o carbohidratos refinados, y deben evitarse.  Hable con su nutricionista para identificar sus objetivos diarios en cuanto a los nutrientes mencionados en la etiqueta. Al ir de compras  Evite comprar alimentos procesados, enlatados o precocinados. Estos alimentos tienden a tSpecial educational needs teachermayor cantidad de gGerlach sodio y azcar agregada.  Compre en la zona exterior de la tienda de comestibles. Esta zona incluye frutas y verduras frescas, granos a granel, carnes frescas y productos lcteos frescos. Al cocinar  Utilice mtodos de coccin a baja temperatura, como hornear, en lugar de mtodos de coccin a alta temperatura, como frer en abundante aceite.  Cocine con aceites saludables, como el aceite de oPeebles canola o gMilton  Evite cocinar con manteca, crema o carnes con alto contenido de grasa. Planificacin de las comidas  Coma las comidas y los refrigerios regularmente, preferentemente a la misma hora todos lMorrilton Evite pasar largos perodos de tiempo sin comer.  Consuma alimentos ricos en fibra, como frutas frescas, verduras, frijoles y cereales integrales. Consulte a su nutricionista sobre cuntas porciones de carbohidratos puede consumir en cada comida.  Consuma entre 4 y 6 onzas (oz) de protenas magras por da, como carnes mEllsworth pollo, pescado, huevos o tofu. Una onza de protena magra equivale a: ? 1 onza de carne, pollo o  pescado. ? 1huevo. ?  taza de tofu.  Coma algunos alimentos por da que contengan grasas saludables, como aguacates, frutos secos, semillas y pescado. Estilo de vida  Controle su nivel de glucemia con regularidad.  Haga actividad fsica habitualmente como se lo haya indicado el mdico. Esto puede incluir lo siguiente: ? 1551mutos semanales de ejercicio de intensidad moderada o alta. Esto podra incluir caminatas dinmicas, ciclismo o gimnasia acutica. ? Realizar ejercicios de elongacin y de fortalecimiento, como yoga o levantamiento de pesas, por lo menos 2veces por semana.  Tome los meTenneco Ince lo haya indicado el mdico.  No consuma ningn producto que contenga nicotina o tabaco, como cigarrillos y ciPsychologist, sport and exerciseSi necesita ayuda para dejar de fumar, consulte al mdHess Corporationon un asesor o instructor en diabetes para identificar estrategias para controlar el estrs y cualquier desafo emocional y social. Preguntas para hacerle al mdico  Es necesario que consulte a unRadio broadcast assistantn el cuidado de la diabetes?  Es necesario que me rena con un nutricionista?  A qu nmero puedo llamar si tengo preguntas?  Cules son los mejores momentos para controlar la glucemia? Dnde encontrar ms informacin:  Asociacin Estadounidense de la Diabetes (American Diabetes Association): diabetes.org  Academia de Nutricin y DiInformation systems managerAcademy of Nutrition and Dietetics): www.eatright.orAieaiabetes y las Enfermedades Digestivas y Renales (NSt John'S Episcopal Hospital [facility] Shoref Diabetes and Digestive and Kidney Diseases, NIH): wwDesMoinesFuneral.dkesumen  Un plan de alimentacin saludable lo ayudar a coAeronautical engineerlucemia y maTheatre managern estilo de vida saludable.  Trabajar con un especialista en dietas y nutricin (nutricionista) puede ayudarlo a Insurance claims handler de alimentacin para usted.  Tenga en cuenta que los carbohidratos (hidratos de  carbono) y el alcohol tienen efectos inmediatos en sus niveles de glucemia. Es importante contar los carbohidratos que ingiere y consumir alcohol con prudencia. Esta informacin no tiene Marine scientist el consejo del mdico. Asegrese de hacerle al mdico cualquier pregunta que tenga. Document Revised: 04/10/2017 Document Reviewed: 11/20/2016 Elsevier Patient Education  [place].

## 2020-07-16 NOTE — Progress Notes (Signed)
Patient ID: Morgan Ramsey, female    DOB: [birth year]  MRN: [id number]  CC: Diabetes   Subjective: Morgan Ramsey is a 44 y.o. female who presents for f/u DM Her concerns today include:  DM with microalbumin, obesity, HL  DM: Lab Results  Component Value Date   HGBA1C 11.6 (H) 06/07/2020  We did a telephone encounter the end of October.  Subsequent labs revealed elevated A1c of 11.6.  I recommended starting Lantus with the Metformin. Met with clinical pharmacist and started Lantus 10 units but ran out 1 wk ago.  Also taking Metformin. -checking BS before breakfast and at bedtime.  Has log with her; range 150- low 200s -eating better but sometimes she gets in a hurry and not able to eat as she would like.  Not getting in much exercise because her neighborhood does not have sidewalks -over due for eye exam.  No insurance -LDL chol was 111 and positive microalbumin.  Pt is breastfeeding  Complains of White itchy spots on LT forearm, one on LT breast and face x 3 mths. No known initiating factors.  No changes in body products.  Patient Active Problem List   Diagnosis Date Noted  . Influenza vaccine refused 07/16/2020  . Hyperlipidemia associated with type 2 diabetes mellitus ([place]) 06/09/2020  . Statins contraindicated 06/09/2020  . Microalbuminuria due to type 2 diabetes mellitus ([place]) 06/09/2020  . Type 2 diabetes mellitus with obesity ([place]) 06/04/2020  . Polyhydramnios, antepartum complication 24/04/7352  . Polyhydramnios in third trimester 08/13/2019  . Unstable lie of fetus 08/06/2019  . Obesity (BMI 30.0-34.9) 05/07/2019  . Transient hypertension of pregnancy 05/07/2019  . Language barrier 04/26/2015  . H/O herpes simplex type 2 infection 04/26/2015  . Previous cesarean section 04/26/2015  . Obesity in pregnancy 04/26/2015  . Anemia 04/26/2015     Current Outpatient Medications on File Prior to Visit  Medication Sig Dispense Refill  . Blood Glucose  Monitoring Suppl (TRUE METRIX METER) w/Device KIT Use as directed 1 kit 0  . glucose blood (TRUE METRIX BLOOD GLUCOSE TEST) test strip Use as instructed 100 each 12  . Insulin Pen Needle (PEN NEEDLES) 31G X 8 MM MISC UAD 100 each 6  . medroxyPROGESTERone (PROVERA) 5 MG tablet Take 2 tablets (10 mg total) by mouth every 8 (eight) hours for 5 days. 30 tablet 0  . TRUEplus Lancets 28G MISC Use as directed 100 each 4   No current facility-administered medications on file prior to visit.    No Known Allergies  Social History   Socioeconomic History  . Marital status: Married    Spouse name: Radio producer  . Number of children: 2  . Years of education: Not on file  . Highest education level: Not on file  Occupational History    Comment: unemployed  Tobacco Use  . Smoking status: Never Smoker  . Smokeless tobacco: Never Used  Vaping Use  . Vaping Use: Never used  Substance and Sexual Activity  . Alcohol use: No  . Drug use: No  . Sexual activity: Yes    Birth control/protection: None  Other Topics Concern  . Not on file  Social History Narrative  . Not on file   Social Determinants of Health   Financial Resource Strain:   . Difficulty of Paying Living Expenses: Not on file  Food Insecurity:   . Worried About Charity fundraiser in the Last Year: Not on file  . Ran Out of Food in the  Last Year: Not on file  Transportation Needs:   . Lack of Transportation (Medical): Not on file  . Lack of Transportation (Non-Medical): Not on file  Physical Activity:   . Days of Exercise per Week: Not on file  . Minutes of Exercise per Session: Not on file  Stress:   . Feeling of Stress : Not on file  Social Connections:   . Frequency of Communication with Friends and Family: Not on file  . Frequency of Social Gatherings with Friends and Family: Not on file  . Attends Religious Services: Not on file  . Active Member of Clubs or Organizations: Not on file  . Attends Archivist  Meetings: Not on file  . Marital Status: Not on file  Intimate Partner Violence:   . Fear of Current or Ex-Partner: Not on file  . Emotionally Abused: Not on file  . Physically Abused: Not on file  . Sexually Abused: Not on file    Family History  Problem Relation Age of Onset  . Diabetes Mother        oral agents  . Hypertension Mother   . Hypertension Father   . Hypertension Brother   . Anesthesia problems Neg Hx   . Hypotension Neg Hx   . Malignant hyperthermia Neg Hx   . Pseudochol deficiency Neg Hx     Past Surgical History:  Procedure Laterality Date  . CESAREAN SECTION  05/21/2011   Procedure: CESAREAN SECTION;  Surgeon: Jonnie Kind, MD;  Location: [facility];  Service: Gynecology;  Laterality: N/A;  Primary cesarean section with delivery of baby girl at 73. Apgars 9/9.  Marland Kitchen HERPES SIMPLEX VIRUS DFA     last outbreak prior to 2017    ROS: Review of Systems Negative except as stated above  PHYSICAL EXAM: BP 128/73   Pulse 81   Temp 98.2 F (36.8 C)   Resp 16   Wt 205 lb 6.4 oz (93.2 kg)   SpO2 100%   BMI 39.45 kg/m   Physical Exam  General appearance - alert, well appearing, and in no distress Mental status - normal mood, behavior, speech, dress, motor activity, and thought processes Neck - supple, no significant adenopathy Chest - clear to auscultation, no wheezes, rales or rhonchi, symmetric air entry Heart - normal rate, regular rhythm, normal S1, S2, no murmurs, rubs, clicks or gallops Extremities - peripheral pulses normal, no pedal edema, no clubbing or cyanosis Skin: Patient has several hypopigmented circular and macular spots on the left forearm palmar surface.  Largest one is about 1 cm in size.  Has well demarcated margins with central clearing.  Small 1 noted on the inner aspect of the left breast. Diabetic Foot Exam - Simple   Simple Foot Form Visual Inspection No deformities, no ulcerations, no other skin breakdown bilaterally: Yes Sensation  Testing Intact to touch and monofilament testing bilaterally: Yes Pulse Check Posterior Tibialis and Dorsalis pulse intact bilaterally: Yes Comments     Depression screen [facility] 2/9 07/16/2020 06/04/2020 08/13/2019  Decreased Interest 0 0 0  Down, Depressed, Hopeless 0 0 0  PHQ - 2 Score 0 0 0  Altered sleeping - - 1  Tired, decreased energy - - 1  Change in appetite - - 0  Feeling bad or failure about yourself  - - 0  Trouble concentrating - - 0  Moving slowly or fidgety/restless - - 0  Suicidal thoughts - - 0  PHQ-9 Score - - 2  Some recent  data might be hidden    CMP Latest Ref Rng & Units 06/07/2020 09/25/2019 03/13/2019  Glucose 65 - 99 mg/dL 284(H) 224(H) 161(H)  BUN 6 - 24 mg/dL _0 Creatinine 0.57 - 1.00 mg/dL 0.61 0.83 0.55(L)  Sodium 134 - 144 mmol/L 136 137 136  Potassium 3.5 - 5.2 mmol/L 3.8 3.5 4.0  Chloride 96 - 106 mmol/L 97 103 100  CO2 20 - 29 mmol/L _1 Calcium 8.7 - 10.2 mg/dL 8.9 8.9 8.8  Total Protein 6.0 - 8.5 g/dL 7.6 6.8 6.8  Total Bilirubin 0.0 - 1.2 mg/dL 0.3 0.2(L) <0.2  Alkaline Phos 44 - 121 IU/L 143(H) 103 92  AST 0 - 40 IU/L _2 ALT 0 - 32 IU/L 18 35 7   Lipid Panel     Component Value Date/Time   CHOL 183 06/07/2020 0940   TRIG 131 06/07/2020 0940   HDL 49 06/07/2020 0940   CHOLHDL 3.7 06/07/2020 0940   LDLCALC 111 (H) 06/07/2020 0940    CBC    Component Value Date/Time   WBC 6.4 06/07/2020 0940   WBC 7.9 09/25/2019 2122   RBC 5.34 (H) 06/07/2020 0940   RBC 4.81 09/25/2019 2122   HGB 12.9 06/07/2020 0940   HGB 9.3 04/02/2015 0000   HCT 40.8 06/07/2020 0940   HCT 32 04/02/2015 0000   PLT 268 06/07/2020 0940   PLT 334 04/02/2015 0000   MCV 76 (L) 06/07/2020 0940   MCH 24.2 (L) 06/07/2020 0940   MCH 23.5 (L) 09/25/2019 2122   MCHC 31.6 06/07/2020 0940   MCHC 31.1 09/25/2019 2122   RDW 13.3 06/07/2020 0940   LYMPHSABS 1.3 03/13/2019 1206   MONOABS 0.9 07/14/2013 0228   EOSABS 0.1 03/13/2019 1206   BASOSABS  0.0 03/13/2019 1206    ASSESSMENT AND PLAN: 1. Type 2 diabetes mellitus with obesity (HCC) Blood sugars not at goal.  Our goal is to get blood sugars before meals between 90-130.  Recommend increasing Lantus to 12 units daily and Metformin 12,000 units twice a day. Dietary counseling given. Encourage her to get in some form of moderate intensity exercise for total of 150 minutes/week.  Advised that she can exercise at home doing walking in place. Encouraged to get eye exam done at least once a year. - POCT glucose (manual entry) - insulin glargine (LANTUS SOLOSTAR) 100 UNIT/ML Solostar Pen; Inject 12 Units into the skin daily.  Dispense: 15 mL; Refill: 11 - metFORMIN (GLUCOPHAGE) 1000 MG tablet; Take 1 tablet (1,000 mg total) by mouth 2 (two) times daily with a meal.  Dispense: 60 tablet; Refill: 6  2. Microalbuminuria due to type 2 diabetes mellitus ([place]) Should improve with improved control of her diabetes.  Unable to use ACE inhibitor at this time as she is breast-feeding  3. Hyperlipidemia associated with type 2 diabetes mellitus ([place]) Unable to use statin at this time as she is breast-feeding.  However encourage healthy eating habits and regular exercise  4. Statins contraindicated   5. Dermatitis Looks like fungal type infection.  Will give a trial of antifungal cream.  Follow-up if no improvement - miconazole (MICOTIN) 2 % cream; Apply to affected area twice a day x 7 days.  Dispense: 35 g; Refill: 0  6. Influenza vaccine refused Recommended.  Patient declined.  She also declined Pneumovax.     Patient was given the opportunity to ask questions.  Patient verbalized understanding of the plan and was able  to repeat key elements of the plan.   Orders Placed This Encounter  Procedures  . POCT glucose (manual entry)     Requested Prescriptions   Signed Prescriptions Disp Refills  . insulin glargine (LANTUS SOLOSTAR) 100 UNIT/ML Solostar Pen 15 mL 11    Sig: Inject 12  Units into the skin daily.  . metFORMIN (GLUCOPHAGE) 1000 MG tablet 60 tablet 6    Sig: Take 1 tablet (1,000 mg total) by mouth 2 (two) times daily with a meal.  . miconazole (MICOTIN) 2 % cream 35 g 0    Sig: Apply to affected area twice a day x 7 days.    Return in about 3 months (around 10/14/2020).  Karle Plumber, MD, FACP

## 2020-08-11 MED FILL — ?BASAGLAR 100 UNITS/ML KWPE: 100 | 25 days supply | Qty: 3 | Fill #1

## 2020-08-11 MED FILL — METFORMIN HCL 1000 MG TABS: 1000 | 30 days supply | Qty: 60 | Fill #1

## 2020-09-03 ENCOUNTER — Encounter: Payer: Self-pay | Admitting: Internal Medicine

## 2020-09-10 MED FILL — TRUEplus LANCETS 28G MISC: 25 days supply | Qty: 100 | Fill #2

## 2020-09-10 MED FILL — TRUE METRIX TEST STRIP: 25 days supply | Qty: 100 | Fill #2

## 2020-09-10 MED FILL — ?BASAGLAR 100 UNITS/ML KWPE: 100 | 25 days supply | Qty: 3 | Fill #2

## 2020-09-14 ENCOUNTER — Other Ambulatory Visit: Payer: Self-pay | Admitting: Internal Medicine

## 2020-09-14 ENCOUNTER — Other Ambulatory Visit: Payer: Self-pay

## 2020-09-14 ENCOUNTER — Ambulatory Visit: Payer: Self-pay | Attending: Internal Medicine | Admitting: Internal Medicine

## 2020-09-14 ENCOUNTER — Other Ambulatory Visit (HOSPITAL_COMMUNITY)
Admission: RE | Admit: 2020-09-14 | Discharge: 2020-09-14 | Disposition: A | Discharge status: Home / Self Care | Payer: Self-pay | Source: Ambulatory Visit | Attending: Internal Medicine | Admitting: Internal Medicine

## 2020-09-14 ENCOUNTER — Encounter: Payer: Self-pay | Admitting: Internal Medicine

## 2020-09-14 VITALS — BP 133/82 | HR 87 | Temp 98.5°F | Resp 16 | Ht 65.0 in | Wt 210.2 lb

## 2020-09-14 DIAGNOSIS — Z1211 Encounter for screening for malignant neoplasm of colon: Secondary | ICD-10-CM

## 2020-09-14 DIAGNOSIS — Z01419 Encounter for gynecological examination (general) (routine) without abnormal findings: Secondary | ICD-10-CM

## 2020-09-14 DIAGNOSIS — E785 Hyperlipidemia, unspecified: Secondary | ICD-10-CM

## 2020-09-14 DIAGNOSIS — Z1231 Encounter for screening mammogram for malignant neoplasm of breast: Secondary | ICD-10-CM

## 2020-09-14 DIAGNOSIS — E1169 Type 2 diabetes mellitus with other specified complication: Secondary | ICD-10-CM

## 2020-09-14 DIAGNOSIS — E669 Obesity, unspecified: Secondary | ICD-10-CM

## 2020-09-14 LAB — GLUCOSE, POCT (MANUAL RESULT ENTRY): POC Glucose: 228 mg/dl — AB (ref 70–99)

## 2020-09-14 LAB — POCT GLYCOSYLATED HEMOGLOBIN (HGB A1C): HbA1c, POC (controlled diabetic range): 7.7 % — AB (ref 0.0–7.0)

## 2020-09-14 MED ORDER — ATORVASTATIN CALCIUM 10 MG PO TABS
10.0000 mg | ORAL_TABLET | Freq: Every day | ORAL | 3 refills | Status: DC
Start: 2020-09-14 — End: 2020-09-14

## 2020-09-14 MED ORDER — PEN NEEDLES 31G X 8 MM MISC: 6 refills | Status: DC

## 2020-09-14 MED ORDER — GLIMEPIRIDE 4 MG PO TABS: 4.0000 mg | ORAL_TABLET | Freq: Every day | ORAL | 3 refills | Status: DC

## 2020-09-14 MED FILL — TRUEPLUS PEN NDL 31GX5/16: 31G X 8 MM | 25 days supply | Qty: 100 | Fill #0

## 2020-09-14 MED FILL — ?ATORVASTATIN 10 MG TABLET: 10 | 30 days supply | Qty: 30 | Fill #0

## 2020-09-14 MED FILL — GLIMEPIRIDE 4 MG TABS: 4 | 30 days supply | Qty: 30 | Fill #0

## 2020-09-14 NOTE — Progress Notes (Signed)
Patient ID: Morgan Ramsey, female    DOB: [birth year]  MRN: [id number]  CC: Annual Exam, Diabetes, and Contraception   Subjective: Morgan Ramsey is a 45 y.o. female who presents for annual well woman exam. Her concerns today include:  DM with microalbumin, obesity, HL  Pt had pap 03/2019 with no abn cell but positive HPV 16.  Pt was pregnant at the time. Pt states that her gyn told her to have repeat pap in 1 yr.  Has clear vaginal dischg and some vaginal itching Menses regular lasting 5-6 days.  Heavy first 3 days. LNMP was 1/8/20222.   Sexually active with her spouse only.  Not on any method of BC but would like to get on something.  Currently uses condoms. Had Nexplanon in past but removed after 6 mths because it caused her to bleed for 6 mths nonstop.  Had depo in past but caused wgh gain  Some pain in supra-pubic area during intercourse and mild cramping post.  No fhx of breast ovarian or uterine cancer. Pt is not breast feeding any longer She is due for colon CA screening.  Willing to do FIT test  DM:  Metformin causes diarrhea.  On Metformin 1 gram BID.  The same happened when she was on the 500 mg but she never mentioned it to me.  On  Lantus 12 units.  Checks BS BID.  Morning BS range 125-137.  Bedtime range 140-180. Doing better with eating habits and trying to move more. We had held off starting her on Lipitor because she was breast-feeding.  Patient tells me today that she has now discontinued breast-feeding and would like to go ahead and start the Lipitor.  HM: She has had COVID-19 vaccine x2.  She has a card with her today.  Patient Active Problem List   Diagnosis Date Noted  . Influenza vaccine refused 07/16/2020  . Hyperlipidemia associated with type 2 diabetes mellitus ([place]) 06/09/2020  . Statins contraindicated 06/09/2020  . Microalbuminuria due to type 2 diabetes mellitus ([place]) 06/09/2020  . Type 2 diabetes mellitus with obesity ([place]) 06/04/2020  .  Polyhydramnios, antepartum complication 89/38/1017  . Polyhydramnios in third trimester 08/13/2019  . Unstable lie of fetus 08/06/2019  . Obesity (BMI 30.0-34.9) 05/07/2019  . Transient hypertension of pregnancy 05/07/2019  . Language barrier 04/26/2015  . H/O herpes simplex type 2 infection 04/26/2015  . Previous cesarean section 04/26/2015  . Obesity in pregnancy 04/26/2015  . Anemia 04/26/2015     Current Outpatient Medications on File Prior to Visit  Medication Sig Dispense Refill  . Blood Glucose Monitoring Suppl (TRUE METRIX METER) w/Device KIT Use as directed 1 kit 0  . glucose blood (TRUE METRIX BLOOD GLUCOSE TEST) test strip Use as instructed 100 each 12  . insulin glargine (LANTUS SOLOSTAR) 100 UNIT/ML Solostar Pen Inject 12 Units into the skin daily. 15 mL 11  . miconazole (MICOTIN) 2 % cream Apply to affected area twice a day x 7 days. 35 g 0  . TRUEplus Lancets 28G MISC Use as directed 100 each 4   No current facility-administered medications on file prior to visit.    Allergies  Allergen Reactions  . Metformin And Related     Diarrhea    Social History   Socioeconomic History  . Marital status: Married    Spouse name: Radio producer  . Number of children: 2  . Years of education: Not on file  . Highest education level: Not on file  Occupational History    Comment: unemployed  Tobacco Use  . Smoking status: Never Smoker  . Smokeless tobacco: Never Used  Vaping Use  . Vaping Use: Never used  Substance and Sexual Activity  . Alcohol use: No  . Drug use: No  . Sexual activity: Yes    Birth control/protection: None  Other Topics Concern  . Not on file  Social History Narrative  . Not on file   Social Determinants of Health   Financial Resource Strain: Not on file  Food Insecurity: Not on file  Transportation Needs: Not on file  Physical Activity: Not on file  Stress: Not on file  Social Connections: Not on file  Intimate Partner Violence: Not on  file    Family History  Problem Relation Age of Onset  . Diabetes Mother        oral agents  . Hypertension Mother   . Hypertension Father   . Hypertension Brother   . Anesthesia problems Neg Hx   . Hypotension Neg Hx   . Malignant hyperthermia Neg Hx   . Pseudochol deficiency Neg Hx     Past Surgical History:  Procedure Laterality Date  . CESAREAN SECTION  05/21/2011   Procedure: CESAREAN SECTION;  Surgeon: Jonnie Kind, MD;  Location: [facility];  Service: Gynecology;  Laterality: N/A;  Primary cesarean section with delivery of baby girl at 26. Apgars 9/9.  Marland Kitchen HERPES SIMPLEX VIRUS DFA     last outbreak prior to 2017    ROS: Review of Systems Negative except as stated above  PHYSICAL EXAM: BP 133/82   Pulse 87   Temp 98.5 F (36.9 C)   Resp 16   Ht $R'5\' 5"'qn$  (1.651 m)   Wt 210 lb 3.2 oz (95.3 kg)   SpO2 99%   BMI 34.98 kg/m   Wt Readings from Last 3 Encounters:  09/14/20 210 lb 3.2 oz (95.3 kg)  07/16/20 205 lb 6.4 oz (93.2 kg)  09/25/19 212 lb (96.2 kg)    Physical Exam  General appearance - alert, well appearing, and in no distress Mental status - normal mood, behavior, speech, dress, motor activity, and thought processes Chest - clear to auscultation, no wheezes, rales or rhonchi, symmetric air entry Heart - normal rate, regular rhythm, normal S1, S2, no murmurs, rubs, clicks or gallops Breasts -CMA Pollock present for breast and pelvic exam.  Breasts appear normal, no suspicious masses, no skin or nipple changes or axillary nodes.  She did have some milk expressed from both nipples.  Patient just recently discontinued breast-feeding permanently. Pelvic - normal external genitalia, vulva, vagina, cervix, uterus and adnexa  Results for orders placed or performed in visit on 09/14/20  POCT glucose (manual entry)  Result Value Ref Range   POC Glucose 228 (A) 70 - 99 mg/dl  POCT glycosylated hemoglobin (Hb A1C)  Result Value Ref Range   Hemoglobin A1C     HbA1c  POC (<> result, manual entry)     HbA1c, POC (prediabetic range)     HbA1c, POC (controlled diabetic range) 7.7 (A) 0.0 - 7.0 %     CMP Latest Ref Rng & Units 06/07/2020 09/25/2019 03/13/2019  Glucose 65 - 99 mg/dL 284(H) 224(H) 161(H)  BUN 6 - 24 mg/dL $Remove'10 12 8  'GTqQkge$ Creatinine 0.57 - 1.00 mg/dL 0.61 0.83 0.55(L)  Sodium 134 - 144 mmol/L 136 137 136  Potassium 3.5 - 5.2 mmol/L 3.8 3.5 4.0  Chloride 96 - 106 mmol/L 97 103 100  CO2 20 - 29 mmol/L $RemoveB'23 25 20  'NSmgwhot$ Calcium 8.7 - 10.2 mg/dL 8.9 8.9 8.8  Total Protein 6.0 - 8.5 g/dL 7.6 6.8 6.8  Total Bilirubin 0.0 - 1.2 mg/dL 0.3 0.2(L) <0.2  Alkaline Phos 44 - 121 IU/L 143(H) 103 92  AST 0 - 40 IU/L $Remov'14 29 12  'bOwCXG$ ALT 0 - 32 IU/L 18 35 7   Lipid Panel     Component Value Date/Time   CHOL 183 06/07/2020 0940   TRIG 131 06/07/2020 0940   HDL 49 06/07/2020 0940   CHOLHDL 3.7 06/07/2020 0940   LDLCALC 111 (H) 06/07/2020 0940    CBC    Component Value Date/Time   WBC 6.4 06/07/2020 0940   WBC 7.9 09/25/2019 2122   RBC 5.34 (H) 06/07/2020 0940   RBC 4.81 09/25/2019 2122   HGB 12.9 06/07/2020 0940   HGB 9.3 04/02/2015 0000   HCT 40.8 06/07/2020 0940   HCT 32 04/02/2015 0000   PLT 268 06/07/2020 0940   PLT 334 04/02/2015 0000   MCV 76 (L) 06/07/2020 0940   MCH 24.2 (L) 06/07/2020 0940   MCH 23.5 (L) 09/25/2019 2122   MCHC 31.6 06/07/2020 0940   MCHC 31.1 09/25/2019 2122   RDW 13.3 06/07/2020 0940   LYMPHSABS 1.3 03/13/2019 1206   MONOABS 0.9 07/14/2013 0228   EOSABS 0.1 03/13/2019 1206   BASOSABS 0.0 03/13/2019 1206    ASSESSMENT AND PLAN: 1. Encounter for annual routine gynecological examination - Cytology - PAP - Cervicovaginal ancillary only  2. Type 2 diabetes mellitus with obesity (HCC) A1c is improved. Stop Metformin.  Change to Amaryl instead.  Continue Lantus.  Encourage her to continue healthy eating habits and try to move as much as she can. Encouraged her to get diabetic eye exam at least once a year.  She is currently  uninsured. - POCT glucose (manual entry) - POCT glycosylated hemoglobin (Hb A1C) - glimepiride (AMARYL) 4 MG tablet; Take 1 tablet (4 mg total) by mouth daily before breakfast.  Dispense: 30 tablet; Refill: 3  3. Encounter for screening mammogram for malignant neoplasm of breast Discussed breast cancer screening.  Patient is willing to start screening at the age of 75. - MM Digital Screening; Future  4. Screening for colon cancer Discussed colon cancer screening and methods to screen.  She is currently uninsured.  She is agreeable to doing the fit kit. - Fecal occult blood, imunochemical([facility])  5. Hyperlipidemia associated with type 2 diabetes mellitus (HCC) - atorvastatin (LIPITOR) 10 MG tablet; Take 1 tablet (10 mg total) by mouth daily.  Dispense: 90 tablet; Refill: 3     Patient was given the opportunity to ask questions.  Patient verbalized understanding of the plan and was able to repeat key elements of the plan.  [employer] interpreter used during this encounter.Caren Griffins [id number]  Orders Placed This Encounter  Procedures  . Fecal occult blood, imunochemical([facility]/Sun[facility])  . MM Digital Screening  . POCT glucose (manual entry)  . POCT glycosylated hemoglobin (Hb A1C)     Requested Prescriptions   Signed Prescriptions Disp Refills  . atorvastatin (LIPITOR) 10 MG tablet 90 tablet 3    Sig: Take 1 tablet (10 mg total) by mouth daily.  Marland Kitchen glimepiride (AMARYL) 4 MG tablet 30 tablet 3    Sig: Take 1 tablet (4 mg total) by mouth daily before breakfast.  . Insulin Pen Needle (PEN NEEDLES) 31G X 8 MM MISC 100 each 6    Sig: UAD  No follow-ups on file.  Karle Plumber, MD, FACP

## 2020-09-14 NOTE — Progress Notes (Signed)
Pt is needing a referral to a dentist. Pt states she has a broken tooth

## 2020-09-17 ENCOUNTER — Other Ambulatory Visit: Payer: Self-pay | Admitting: Internal Medicine

## 2020-09-17 DIAGNOSIS — R87629 Unspecified abnormal cytological findings in specimens from vagina: Secondary | ICD-10-CM

## 2020-09-17 LAB — CYTOLOGY - PAP
Comment: NEGATIVE
Diagnosis: NEGATIVE
HPV 16: POSITIVE — AB
HPV 18 / 45: NEGATIVE
High risk HPV: POSITIVE — AB

## 2020-09-17 NOTE — Progress Notes (Signed)
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  I will need to refer her to the gynecologist for further evaluation and management of this.  I have submitted the referral.

## 2020-09-18 LAB — FECAL OCCULT BLOOD, IMMUNOCHEMICAL: Fecal Occult Bld: NEGATIVE

## 2020-09-19 LAB — CERVICOVAGINAL ANCILLARY ONLY
Bacterial Vaginitis (gardnerella): NEGATIVE
Candida Glabrata: NEGATIVE
Candida Vaginitis: NEGATIVE
Chlamydia: NEGATIVE
Comment: NEGATIVE
Comment: NEGATIVE
Comment: NEGATIVE
Comment: NEGATIVE
Comment: NEGATIVE
Comment: NORMAL
Neisseria Gonorrhea: NEGATIVE
Trichomonas: NEGATIVE

## 2020-09-19 NOTE — Progress Notes (Signed)
Let patient know that her fecal occult test which screens for colon cancer was negative.  This is good.  We will need to repeat it again in 1 year.

## 2020-09-20 ENCOUNTER — Telehealth: Payer: Self-pay | Admitting: Internal Medicine

## 2020-09-20 NOTE — Telephone Encounter (Signed)
Patient called asking for results from her recent visit with the provider. I connected with the interpreter service line to assist the call. Per nurse Susette Racer ok for me to read Dr. Durenda Age note. I read the note per Wynetta Emery and patient understood. Patient asked about the referral to gynecology and I let her know the referral had been placed and that office should be in contact with her in the next few weeks. Patient understood.

## 2020-09-21 ENCOUNTER — Telehealth: Payer: Self-pay

## 2020-09-21 NOTE — Telephone Encounter (Signed)
[employer] interpreters Diane  Id# [phone]  contacted pt to go over pap/fit results pt is aware and doesn't have any questions or concerns

## 2020-09-27 ENCOUNTER — Other Ambulatory Visit: Payer: Self-pay

## 2020-09-27 ENCOUNTER — Ambulatory Visit: Payer: Self-pay | Attending: Internal Medicine

## 2020-10-11 ENCOUNTER — Telehealth: Payer: Self-pay | Admitting: Internal Medicine

## 2020-10-11 MED FILL — ?BASAGLAR 100 UNITS/ML KWPE: 100 | 25 days supply | Qty: 3 | Fill #3

## 2020-10-11 NOTE — Telephone Encounter (Signed)
Copied from [place] [phone]. Topic: General - Call Back - No Documentation >> Oct 11, 2020 10:01 AM Loma [place] wrote: CRM for notification. See Telephone encounter for: 10/11/20.Pt needs a FU call back [phone]. Pt request CB from [facility]

## 2020-10-11 NOTE — Telephone Encounter (Signed)
I return Pt call, LVM to call me back

## 2020-10-14 MED FILL — GLIMEPIRIDE 4 MG TABS: 4 | 30 days supply | Qty: 30 | Fill #1

## 2020-10-14 MED FILL — TRUE METRIX GLUCOSE TEST ST: 25 days supply | Qty: 100 | Fill #3

## 2020-10-14 MED FILL — ?ATORVASTATIN 10 MG TABLET: 10 | 30 days supply | Qty: 30 | Fill #1

## 2020-10-21 ENCOUNTER — Ambulatory Visit: Payer: No Typology Code available for payment source | Admitting: *Deleted

## 2020-10-21 ENCOUNTER — Other Ambulatory Visit: Payer: Self-pay

## 2020-10-21 ENCOUNTER — Ambulatory Visit
Admission: RE | Admit: 2020-10-21 | Discharge: 2020-10-21 | Disposition: A | Discharge status: Home / Self Care | Payer: No Typology Code available for payment source | Source: Ambulatory Visit | Attending: Internal Medicine | Admitting: Internal Medicine

## 2020-10-21 VITALS — BP 130/86 | Wt 207.3 lb

## 2020-10-21 DIAGNOSIS — R87618 Other abnormal cytological findings on specimens from cervix uteri: Secondary | ICD-10-CM

## 2020-10-21 DIAGNOSIS — Z1239 Encounter for other screening for malignant neoplasm of breast: Secondary | ICD-10-CM

## 2020-10-21 DIAGNOSIS — Z1231 Encounter for screening mammogram for malignant neoplasm of breast: Secondary | ICD-10-CM

## 2020-10-21 NOTE — Patient Instructions (Signed)
Explained breast self awareness with Morgan Ramsey. Patient did not need a Pap smear today due to last Pap smear was 09/14/2020. Explained the colposcopy the recommended follow-up for her HPV positive Pap smear. Referred patient to the [facility] for a colposcopy to follow-up for HPV positive Pap smear. [facility] call with appointment. Referred patient to the [facility] for a screening mammogram on mobile unit. Appointment scheduled Thursday, October 21, 2020 at 1110. Patient escorted to the mobile unit following [facility] appointment for her screening mammogram. Let patient know the [facility] will follow up with her within the next couple weeks with results of her mammogram by letter or phone. Morgan Ramsey verbalized understanding.  [name], [place], RN 10:14 AM

## 2020-10-21 NOTE — Progress Notes (Signed)
Morgan Ramsey is a 45 y.o. female who presents to [facility] clinic today with no complaints. Patient referred to [place] by [facility] due to recommending a colposcopy to follow-up for her Pap smear on 09/14/2020.     Pap Smear: Pap smear not completed today. Last Pap smear was 09/14/2020 at [facility] clinic and was normal with positive for HPV 16. Patient has history of a previous Pap smear 04/02/2019 that was negative with positive HPV 16. Patient had a colposcopy to follow-up 04/23/2019 that no biopsies were completed. Last Pap smear result is available in Epic.   Physical exam: Breasts Breasts symmetrical. No skin abnormalities bilateral breasts. No nipple retraction bilateral breasts. No nipple discharge bilateral breasts. No lymphadenopathy. No lumps palpated bilateral breasts. No complaints of pain or tenderness on exam.       Pelvic/Bimanual Pap is not indicated today per BCCCP guidelines.   Smoking History: Patient has never smoked.   Patient Navigation: Patient education provided. Access to services provided for patient through [place] program. Spanish interpreter Edwinna Areola from [facility] provided.   Colorectal Cancer Screening: Per patient has never had colonoscopy completed. No complaints today.   Breast and Cervical Cancer Risk Assessment: Patient has family history of a maternal cousin having breast cancer. Patient has no known genetic mutations or history of radiation treatment to the chest before age 55. Patient does not have history of cervical dysplasia, immunocompromised, or DES exposure in-utero.  Risk Assessment    Risk Scores      10/21/2020   Last edited by: [name], LPN   5-year risk: 0.6 %   Lifetime risk: 7.5 %         A: BCCCP exam without pap smear No complaints.  P: Referred patient to the [facility] for a screening mammogram on mobile unit. Appointment scheduled  Thursday, October 21, 2020 at 1110.  Referred patient to the [facility] for [place] for a colposcopy to follow-up for HPV positive Pap smear. [facility] will call with appointment.  Loletta Parish, RN 10/21/2020 10:13 AM

## 2020-10-26 ENCOUNTER — Telehealth: Payer: Self-pay

## 2020-10-26 NOTE — Telephone Encounter (Signed)
[employer] interpreters Morgan Ramsey  Id# [id number]  contacted pt to go over lab results pt is aware and doesn't have any questions or concerns

## 2020-10-28 ENCOUNTER — Telehealth: Payer: Self-pay | Admitting: Internal Medicine

## 2020-10-28 NOTE — Telephone Encounter (Signed)
Please contact pt and make her aware that she only has cafa with 100% she doesn't have the OC. Per carlos he is waiting for her to bring some papers to him. So she can come by and pick up the dental resources or we can mail them to her

## 2020-10-28 NOTE — Telephone Encounter (Signed)
Copied from [place] [id number]. Topic: General - Other >> Oct 26, 2020  4:05 PM Pawlus, Brayton Layman A wrote: Reason for CRM: Pt requested a referral for a dentist, pt wanted a molar removed.

## 2020-11-15 ENCOUNTER — Other Ambulatory Visit: Payer: Self-pay

## 2020-11-15 ENCOUNTER — Encounter: Payer: Self-pay | Admitting: Obstetrics and Gynecology

## 2020-11-15 ENCOUNTER — Ambulatory Visit (INDEPENDENT_AMBULATORY_CARE_PROVIDER_SITE_OTHER): Payer: Self-pay | Admitting: Obstetrics and Gynecology

## 2020-11-15 ENCOUNTER — Other Ambulatory Visit (HOSPITAL_COMMUNITY)
Admission: RE | Admit: 2020-11-15 | Discharge: 2020-11-15 | Disposition: A | Discharge status: Home / Self Care | Payer: No Typology Code available for payment source | Source: Ambulatory Visit | Attending: Obstetrics and Gynecology | Admitting: Obstetrics and Gynecology

## 2020-11-15 VITALS — BP 125/81 | HR 81 | Wt 208.1 lb

## 2020-11-15 DIAGNOSIS — Z3202 Encounter for pregnancy test, result negative: Secondary | ICD-10-CM

## 2020-11-15 DIAGNOSIS — Z789 Other specified health status: Secondary | ICD-10-CM

## 2020-11-15 DIAGNOSIS — R87810 Cervical high risk human papillomavirus (HPV) DNA test positive: Secondary | ICD-10-CM

## 2020-11-15 LAB — POCT PREGNANCY, URINE: Preg Test, Ur: NEGATIVE

## 2020-11-15 NOTE — Progress Notes (Signed)
Morgan Ramsey In person Interpreter.

## 2020-11-15 NOTE — Patient Instructions (Signed)
[email or web address]">  Colposcopa Colposcopy  La colposcopa es un procedimiento para examinar la parte inferior del tero (cuello uterino), la vagina, la zona que rodea la abertura vaginal (vulva) para identificar anormalidades o signos de enfermedad. Este procedimiento se realiza con un instrumento que hace que los objetos aparezcan ms grandes y Retail buyer (colposcopio). Durante el procedimiento, el mdico puede extraer una muestra de tejido para examinarla luego con un microscopio (biopsia). Se puede realizar una biopsia si se encuentran clulas inusuales durante la colposcopa. Pueden hacerle una colposcopa si tiene lo siguiente:  Un frotis de Papanicolaou, tambin llamado prueba de Papanicolaou, con resultado anormal. Esta prueba de deteccin se utiliza para detectar signos de cncer o infeccin de vagina, cuello uterino y tero.  Francee Gentile de VPH (virus del papiloma humano) con resultado positivo de un tipo de VPH que la pone en alto riesgo de Animal nutritionist.  Ciertas afecciones o sntomas, por ejemplo: ? [place] llaga o una lesin en el cuello uterino. ? Verrugas genitales en la vulva, la vagina o el cuello uterino. ? Dolor al Merrill Lynch. ? Sangrado vaginal, especialmente despus de Clinical biochemist.  Un crecimiento en el cuello uterino (plipo cervical) que debe ser extirpado. Informe a su mdico acerca de lo siguiente:  Cualquier alergia que tenga, incluidas las alergias a medicamentos, al ltex o al yodo.  Todos los [employer] Corporation [place], incluidos vitaminas, hierbas, gotas oftlmicas, cremas y medicamentos de venta libre.  Cualquier problema previo que usted o algn miembro de su familia hayan tenido con los anestsicos.  Cualquier trastorno de la sangre que tenga.  Cirugas a las que se haya sometido.  Cualquier afeccin mdica que tenga, como enfermedad plvica inflamatoria (EPI) o trastorno  endometrial.  El patrn de sus ciclos menstruales y el mtodo de control de la natalidad (anticonceptivo) que [place] and the [place].  Sus antecedentes mdicos, incluidos antecedentes de sufrir desmayos con frecuencia o de tratamiento del cuello uterino.  Si est embarazada o podra estarlo. Cules son los riesgos? En general, se trata de un procedimiento seguro. Sin embargo, pueden ocurrir complicaciones, por ejemplo:  Infeccin. Los sntomas de infeccin pueden incluir fiebre, secrecin vaginal con mal olor o dolor plvico.  Reacciones alrgicas a los medicamentos.  Daos a las estructuras o los rganos cercanos.  Desmayos. Esto es poco frecuente. Qu ocurre antes del procedimiento? Restricciones en las comidas y bebidas  Siga las instrucciones del mdico respecto de las restricciones en las comidas o las bebidas.  Probablemente deba seguir una dieta normal el da del procedimiento y no omitir Architect. Estudios  Pueden hacerle un examen o anlisis. Se har una prueba de Eastman Kodak del procedimiento.  Pueden extraerle Truddie Coco de [place] o pedirle [place] de [place]. Instrucciones generales  Consulte al mdico sobre: ? Quarry manager o suspender los medicamentos que [place] habitualmente. Esto es muy importante si toma medicamentos para la diabetes o anticoagulantes. ? Tomar medicamentos como aspirina e ibuprofeno. Estos medicamentos pueden tener un efecto anticoagulante en la [place]. No tome estos medicamentos a menos que el mdico se lo indique. Es probable que Investment banker, operational diga que evite tomar aspirina o medicamentos que contienen aspirina durante 7das antes del procedimiento. ? Usar medicamentos de venta libre, vitaminas, hierbas y suplementos.  Infrmele al mdico si tiene el perodo menstrual ahora o lo tendr en el momento del procedimiento. La colposcopa generalmente no se realiza durante el perodo menstrual.  Si [place] anticonceptivos, contine usndolos antes del  procedimiento.  Durante 24horas antes del  procedimiento: ? No se haga duchas vaginales ni use tampones. ? No se aplique medicamentos, cremas o supositorios en la vagina. ? No tenga relaciones sexuales.  Pregntele al mdico qu medidas se tomarn para prevenir una infeccin. Qu ocurre durante el procedimiento?  Estar acostada boca arriba, con los pies en los soportes (estribos).  Se entibiar una herramienta llamada espculo y se le pondr aceite o gel (la Architect). Luego, se le introducir el espculo en la vagina. Este se usar para separar las paredes de la vagina de modo que el mdico pueda ver el cuello uterino y el interior de la vagina.  Se utilizar un hisopo para Glass blower/designer una pequea cantidad de un lquido (solucin) en las zonas que se examinarn. Esta solucin facilita la observacin de clulas anormales. Puede sentir un leve ardor en este momento.  Se usar el colposcopio para observar el cuello del tero con una luz blanca brillante. El colposcopio se sostendr cerca de la vulva y har que la vulva, la vagina y el cuello uterino aparezcan ms grandes para que puedan verse mejor.  Si se necesita una biopsia: ? Pueden administrarle un medicamento para adormecer la zona (anestesia local). ? Se emplearn herramientas quirrgicas para retirar mucosidad y clulas a travs de la vagina. ? Puede sentir un dolor leve cuando le extraen la [place] de tejido. ? Puede haber hemorragia. Se puede usar una solucin para Psychologist, educational. ? Si se necesita obtener una biopsia del interior del cuello uterino, se puede hacer otro procedimiento, llamado curetaje endocervical (CEC). Durante este procedimiento, se usar una cureta para raspar clulas del cuello uterino o de la parte superior del cuello uterino (endocrvix).  Se registrar cualquier anomala que se detecte. El procedimiento puede variar segn el mdico y el hospital. Sander Nephew ocurre despus del procedimiento?  Estar  recostada y har reposo durante unos minutos. Tal vez le ofrezcan jugo o galletas.  Le controlarn la presin arterial, la frecuencia cardaca, la frecuencia respiratoria y Retail buyer de oxgeno en la sangre hasta que le den el alta del hospital o la clnica.  Puede tener calambres en el abdomen. Esto debe desaparecer despus de algunos minutos.  Es su responsabilidad retirar Gap Inc del procedimiento. Pregntele a su mdico, o a un miembro del personal del departamento donde se realice el procedimiento, cundo estarn Praxair. Resumen  La colposcopa es un procedimiento para examinar la parte inferior del tero (cuello uterino), la vagina, la zona que rodea la abertura vaginal (vulva) para identificar anormalidades o signos de enfermedad.  Se puede realizar una biopsia como parte del procedimiento.  Despus del procedimiento, permanecer recostada y descansar durante unos minutos.  Puede tener calambres en el abdomen. Esto debe desaparecer despus de algunos minutos. Esta informacin no tiene Marine scientist el consejo del mdico. Asegrese de hacerle al mdico cualquier pregunta que tenga. Document Revised: 09/08/2019 Document Reviewed: 09/08/2019 Elsevier Patient Education  2021 [place]. https://[email or web address].org/Patients/[email or web address]/[email or web address]">  Colposcopa, cuidados posteriores Colposcopy, Care After Target Corporation brinda informacin sobre cmo cuidarse despus del procedimiento. Su mdico tambin podr darle instrucciones ms especficas. Comunquese con el mdico si tiene problemas o preguntas. Qu puedo esperar despus del procedimiento? Si le realizaron una colposcopa sin biopsia, puede esperar sentirse bien inmediatamente despus del procedimiento. Sin embargo, es posible que tenga algo de [place] de sangre durante [place]. Puede retomar sus actividades habituales. Si se le realiz una colposcopa con biopsia, despus del procedimiento es frecuente que  presente lo siguiente:  Sensibilidad y  dolor leve. Volta.  Desvanecimiento.  Sangrado leve de la vagina o secrecin de color oscuro y Therapist, art. [place]. La secrecin puede deberse a un lquido (solucin) que se [place] durante el procedimiento. Durante este tiempo deber usar un apsito sanitario.  Manchas de Medtronic al menos 48horas despus del procedimiento. Siga estas instrucciones en su casa: Medicamentos  Use los medicamentos de venta libre y los recetados solamente como se lo haya indicado el mdico.  Hable con el mdico acerca de qu tipo de analgsico de venta libre y recetado puede volver a Radio producer. Es especialmente importante que hable con el mdico si toma anticoagulantes. Actividad  Limite la actividad fsica Agricultural consultant despus del procedimiento como se lo haya indicado el mdico.  Evite usar productos de ducha vaginal, usar tampones o Office manager sexuales durante al menos 3 das despus del procedimiento o durante el tiempo que le hayan indicado.  Retome sus actividades normales segn lo indicado el mdico. Pregntele al mdico qu actividades son seguras para usted. Instrucciones generales  Beber suficiente lquido como para mantener la orina de color amarillo plido.  Pregunte al mdico si puede tomar baos de inmersin, nadar o usar el jacuzzi. Puede ducharse.  Si [place] un mtodo anticonceptivo (anticoncepcin), contine utilizndolo.  Concurra a todas las visitas de seguimiento como se lo haya indicado el mdico. Esto es importante.   Comunquese con un mdico si:  Tiene una erupcin cutnea. Solicite ayuda de inmediato si:  Sangra mucho por la vagina o le salen cogulos de [place]. Esto incluye usar ms de un apsito sanitario cada hora durante 2 horas seguidas.  Tiene fiebre o escalofros.  Tiene secrecin vaginal que es anormal, tiene color amarillo o tiene mal olor. Puede ser un signo de  infeccin.  Tiene dolor intenso o clicos en la parte baja del abdomen que no se van con medicamentos.  Se desmaya. Resumen  Si se le realiz una colposcopa sin biopsia, puede esperar sentirse bien de inmediato, pero es posible que presente manchas de sangre por algunos das. Puede retomar sus actividades habituales.  Si se le realiz una colposcopa con biopsia, es comn tener un dolor leve durante algunos das y [place] de sangre durante 48 horas despus del procedimiento.  Evite usar productos de ducha vaginal, usar tampones y Office manager sexuales durante al menos 3 das despus del procedimiento o durante el tiempo que le haya indicado el mdico.  Busque ayuda de inmediato si tiene sangrado profuso, dolor intenso o signos de infeccin. Esta informacin no tiene Marine scientist el consejo del mdico. Asegrese de hacerle al mdico cualquier pregunta que tenga. Document Revised: 09/08/2019 Document Reviewed: 09/08/2019 Elsevier Patient Education  2021 [place] y Science writer HPV and Cancer Information El VPH (virus del papiloma humano)es un virus muy comn que se transmite de Ardelia Mems persona a otra a travs del contacto piel con piel o del contacto sexual. Hay varios tipos de VPH. A menudo no causa sntomas. Sin embargo, dependiendo del tipo, a Research officer, trade union (VPH genitalo mucoso) o en las manos o los pies (VPH cutneo o no mucoso). Las personas pueden estar infectadas con el VPH por un Lily Peer y transmitirlo a Producer, television/film/video sin saberlo. Algunas infecciones por el VPH desaparecen por s solas en un perodo de 2 aos, pero otras infecciones por el VPH se consideran de alto riesgo y pueden causar cambios en  las clulas que Facilities manager. Usted puede tomar medidas para evitar la infeccin por el VPH y disminuir el riesgo de Optician, dispensing. [place]? El VPH puede provocar verrugas en los  genitales o en las manos o los pies. Tambin puede causar lesiones similares a verrugas en la garganta.  Ciertos tipos de VPH genital tambin Barrister's clerk, Dow Chemical se pueden incluir:  Cncer de cuello uterino.  Cncer vaginal.  Cncer vulvar.  Cncer anal.  Cncer de garganta.  Cncer en la lengua o la boca.  Cncer de pene. Cmo se transmite el VPH? El VPH se propaga fcilmente a travs del contacto directo Howell Pringle persona y [place]. El VPH genital se transmite a travs del contacto sexual. Puede contraer VPH por sexo vaginal, oral, anal o simplemente por el contacto con los genitales de Nurse, children's. Incluso las [place] Tool Works tienen una sola pareja sexual pueden tener VPH porque esa pareja puede tenerlo. El VPH con frecuencia no causa sntomas, por lo que la mayora de las personas infectadas no saben que lo tienen. Qu medidas puedo tomar para prevenir el VPH? Pacific Mutual siguientes medidas para prevenir la infeccin por el VPH:  Philis [place] con su mdico acerca de cmo recibir la Charity fundraiser VPH. Esta vacuna la protege contra los tipos de VPH que podran Dentist.  Limite la cantidad de personas con las que Health Net. [place], evite tener sexo con personas que han tenido demasiadas parejas sexuales.  Use preservativos durante las relaciones sexuales.  Hable con su pareja sexual acerca de su salud.   Qu medidas puedo tomar para reducir mi riesgo de Best boy cncer? Tener un estilo de vida saludable y tomar algunas medidas preventivas pueden ayudarle a disminuir su riesgo de Horticulturist, commercial, tanto si tiene o no el VPH genital. Algunas de las medidas que puede implementar, [place], entre otras: Estilo de vida  Engineer, mining sexo seguro para prevenir la infeccin por el VPH.  No consuma ningn producto que contenga nicotina o tabaco, como cigarrillos, cigarrillos electrnicos y tabaco de Higher education careers adviser. Si necesita ayuda para dejar de fumar,  consulte al mdico.  Consuma alimentos que contengan antioxidantes, como frutas, verduras y Actor. Intente consumir, como mnimo, 5porciones de frutas y [employer]W [employer].  Haga ejercicio con regularidad.  Baje de peso si es necesario.  Mantenga una buena higiene bucal. Esto incluye el uso del hilo dental y el cepillado diario. Otras medidas de prevencin  Aplquese la vacuna contra el VPH como se lo haya indicado el mdico.  Hgase los estudios de deteccin de ITS aunque no tenga sntomas del VPH. Es posible que tenga VPH y no sepa que lo tiene.  Si es mujer, debe hacerse pruebas de Papanicolaou y VPH con regularidad. Hable con el mdico sobre la frecuencia con la que debe hacerse estas pruebas. Las pruebas de Papanicolaou ayudan a Clinical biochemist en las clulas que pueden Horticulturist, commercial. Las pruebas del VPH ayudarn a Adult nurse presencia del VPH en las clulas del cuello uterino. Dnde buscar ms informacin Obtenga ms informacin sobre el VPH y Science writer en:  Doctor, hospital for Disease Control and Prevention (Centros para Building surveyor y [place]): http://sweeney-todd.com/  Lyondell Chemical ([place]): www.cancer.Engineer, structural Cancer Society (Sociedad Estadounidense contra el Cncer): www.cancer.org Comunquese con un mdico si:  Tiene verrugas genitales.  Es sexualmente Electrical engineer y [place].  No se protegi [place]  relaciones sexuales y [place] realizarse pruebas de deteccin de ITS. Resumen  El virus del papiloma humano (VPH) es un virus muy comn que se transmite fcilmente de Ardelia Mems persona a otra y Curator.  Ciertos tipos de VPH genital se consideran de algo riesgo y pueden causar cambios en las clulas que pueden derivar en cncer.  Debe tomar medidas para evitar la infeccin por el VPH, como limitar el nmero de Advertising copywriter, usar preservativos cuando tiene sexo y aplicarse la  vacuna contra el VPH.  Hacer cambios en su estilo de vida ayuda a reducir Research scientist (medical). Estos cambios incluyen tener una dieta saludable, hacer ejercicios regularmente y no consumir productos que contengan nicotina ni tabaco.  Es posible que tenga VPH y no sepa que lo tiene. Hgase los estudios de deteccin de ITS aunque no tenga sntomas del VPH. Si es mujer, debe hacerse pruebas de Papanicolaou y de VPH con regularidad como se lo haya indicado el mdico. Esta informacin no tiene Marine scientist el consejo del mdico. Asegrese de hacerle al mdico cualquier pregunta que tenga. Document Revised: 05/21/2020 Document Reviewed: 05/21/2020 Elsevier Patient Education  2021 Reynolds American.

## 2020-11-15 NOTE — Progress Notes (Signed)
Patient given informed consent, signed copy in the chart, time out was performed. Pap reviewed.  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  UPT negative.  Pt midcycle of menstrual bleeding today. Placed in lithotomy position. Cervix viewed with speculum and colposcope after application of acetic acid.   Colposcopy adequate?  yes Acetowhite lesions? no Punctation? no Mosaicism?  no Abnormal vasculature?  no Biopsies? no ECC? yes  COMMENTS:  Patient was given post procedure instructions.  Will schedule follow up pending pathology results.  Griffin Basil, MD

## 2020-11-17 ENCOUNTER — Other Ambulatory Visit: Payer: Self-pay

## 2020-11-17 ENCOUNTER — Encounter: Payer: Self-pay | Admitting: Internal Medicine

## 2020-11-17 LAB — SURGICAL PATHOLOGY

## 2020-11-17 MED FILL — Atorvastatin Calcium Tab 10 MG (Base Equivalent): ORAL | 30 days supply | Qty: 30 | Fill #0 | Status: AC

## 2020-11-17 MED FILL — Glimepiride Tab 4 MG: ORAL | 30 days supply | Qty: 30 | Fill #0 | Status: AC

## 2020-11-17 MED FILL — Insulin Glargine Soln Pen-Injector 100 Unit/ML: SUBCUTANEOUS | 25 days supply | Qty: 3 | Fill #0 | Status: AC

## 2020-11-18 ENCOUNTER — Encounter: Payer: Self-pay | Admitting: General Practice

## 2020-11-18 ENCOUNTER — Telehealth: Payer: Self-pay

## 2020-11-18 NOTE — Telephone Encounter (Addendum)
-----   Message from Griffin Basil, MD sent at 11/18/2020 11:38 AM EDT ----- ECC showed no cancer or precancer, recommend repap in 1 year with HPV testing    Called pt with interpreter Eda Royal. Results and provider recommendation given.

## 2020-11-23 ENCOUNTER — Other Ambulatory Visit: Payer: Self-pay

## 2020-12-23 ENCOUNTER — Other Ambulatory Visit: Payer: Self-pay

## 2020-12-23 MED FILL — Glimepiride Tab 4 MG: ORAL | 30 days supply | Qty: 30 | Fill #1 | Status: CN

## 2020-12-23 MED FILL — Insulin Glargine Soln Pen-Injector 100 Unit/ML: SUBCUTANEOUS | 25 days supply | Qty: 3 | Fill #1 | Status: CN

## 2020-12-23 MED FILL — Atorvastatin Calcium Tab 10 MG (Base Equivalent): ORAL | 30 days supply | Qty: 30 | Fill #1 | Status: CN

## 2020-12-30 ENCOUNTER — Other Ambulatory Visit: Payer: Self-pay

## 2021-01-13 ENCOUNTER — Other Ambulatory Visit: Payer: Self-pay

## 2021-01-13 MED FILL — Lancets: 25 days supply | Qty: 100 | Fill #0 | Status: AC

## 2021-01-13 MED FILL — Glimepiride Tab 4 MG: ORAL | 30 days supply | Qty: 30 | Fill #1 | Status: AC

## 2021-01-13 MED FILL — Atorvastatin Calcium Tab 10 MG (Base Equivalent): ORAL | 30 days supply | Qty: 30 | Fill #1 | Status: AC

## 2021-01-13 MED FILL — Glucose Blood Test Strip: 25 days supply | Qty: 100 | Fill #0 | Status: AC

## 2021-01-13 MED FILL — Insulin Glargine Soln Pen-Injector 100 Unit/ML: SUBCUTANEOUS | 25 days supply | Qty: 3 | Fill #1 | Status: AC

## 2021-01-14 ENCOUNTER — Other Ambulatory Visit: Payer: Self-pay

## 2021-02-18 ENCOUNTER — Other Ambulatory Visit: Payer: Self-pay | Admitting: Internal Medicine

## 2021-02-18 ENCOUNTER — Other Ambulatory Visit: Payer: Self-pay

## 2021-02-18 DIAGNOSIS — E1169 Type 2 diabetes mellitus with other specified complication: Secondary | ICD-10-CM

## 2021-02-18 MED ORDER — GLIMEPIRIDE 4 MG PO TABS
ORAL_TABLET | ORAL | 3 refills | Status: DC
  Filled 2021-02-18: qty 30, 30d supply, fill #0
  Filled 2021-03-21: qty 30, 30d supply, fill #1
  Filled 2021-05-12: qty 30, 30d supply, fill #2
  Filled 2021-07-18: qty 30, 30d supply, fill #3
  Filled 2021-08-22: qty 30, 30d supply, fill #0

## 2021-02-18 MED FILL — Insulin Glargine Soln Pen-Injector 100 Unit/ML: SUBCUTANEOUS | 25 days supply | Qty: 3 | Fill #2 | Status: AC

## 2021-02-18 MED FILL — Insulin Pen Needle 31 G X 8 MM (1/3" or 5/16"): 30 days supply | Qty: 100 | Fill #0 | Status: AC

## 2021-02-18 MED FILL — Atorvastatin Calcium Tab 10 MG (Base Equivalent): ORAL | 30 days supply | Qty: 30 | Fill #2 | Status: AC

## 2021-03-21 ENCOUNTER — Other Ambulatory Visit: Payer: Self-pay

## 2021-03-21 MED FILL — Insulin Glargine Soln Pen-Injector 100 Unit/ML: SUBCUTANEOUS | 25 days supply | Qty: 3 | Fill #3 | Status: AC

## 2021-03-21 MED FILL — Atorvastatin Calcium Tab 10 MG (Base Equivalent): ORAL | 30 days supply | Qty: 30 | Fill #3 | Status: AC

## 2021-03-24 ENCOUNTER — Other Ambulatory Visit: Payer: Self-pay

## 2021-05-12 ENCOUNTER — Other Ambulatory Visit: Payer: Self-pay

## 2021-05-12 MED FILL — Atorvastatin Calcium Tab 10 MG (Base Equivalent): ORAL | 30 days supply | Qty: 30 | Fill #4 | Status: AC

## 2021-05-12 MED FILL — Insulin Glargine Soln Pen-Injector 100 Unit/ML: SUBCUTANEOUS | 25 days supply | Qty: 3 | Fill #4 | Status: AC

## 2021-05-13 ENCOUNTER — Other Ambulatory Visit: Payer: Self-pay

## 2021-07-18 ENCOUNTER — Other Ambulatory Visit: Payer: Self-pay | Admitting: Internal Medicine

## 2021-07-18 ENCOUNTER — Other Ambulatory Visit: Payer: Self-pay

## 2021-07-18 DIAGNOSIS — E1169 Type 2 diabetes mellitus with other specified complication: Secondary | ICD-10-CM

## 2021-07-18 DIAGNOSIS — E669 Obesity, unspecified: Secondary | ICD-10-CM

## 2021-07-19 ENCOUNTER — Other Ambulatory Visit: Payer: Self-pay

## 2021-07-19 NOTE — Telephone Encounter (Signed)
Requested medication (s) are due for refill today: not on current med list  Requested medication (s) are on the active medication list: no  Last refill:  01/14/21  Future visit scheduled: no  Notes to clinic:  not on current med list, not seen since 09/14/20, no upcoming visit scheduled, please assess.   Requested Prescriptions  Pending Prescriptions Disp Refills   glucose blood (TRUE METRIX BLOOD GLUCOSE TEST) test strip 100 strip 12    Sig: USE AS INSTRUCTED     Endocrinology: Diabetes - Testing Supplies Passed - 07/18/2021  4:05 PM      Passed - Valid encounter within last 12 months    Recent Outpatient Visits           10 months ago Encounter for annual routine gynecological examination   [place] Karle Plumber B, MD   1 year ago Type 2 diabetes mellitus with obesity [facility])   [place], Deborah B, MD   1 year ago Type 2 diabetes mellitus with obesity [facility])   [place], RPH-CPP   1 year ago Encounter to establish care   [place], MD               TRUEplus Lancets 28G MISC 100 each 4    Sig: USE AS DIRECTED     Endocrinology: Diabetes - Testing Supplies Passed - 07/18/2021  4:05 PM      Passed - Valid encounter within last 12 months    Recent Outpatient Visits           10 months ago Encounter for annual routine gynecological examination   [place], Deborah B, MD   1 year ago Type 2 diabetes mellitus with obesity [facility])   [place] Ladell Pier, MD   1 year ago Type 2 diabetes mellitus with obesity [facility])   Parnell Ausdall, Jarome Matin, RPH-CPP   1 year ago Encounter to establish care   [facility] Ladell Pier, MD

## 2021-07-25 ENCOUNTER — Other Ambulatory Visit: Payer: Self-pay

## 2021-07-28 ENCOUNTER — Other Ambulatory Visit: Payer: Self-pay

## 2021-08-22 ENCOUNTER — Other Ambulatory Visit: Payer: Self-pay

## 2021-08-23 ENCOUNTER — Other Ambulatory Visit: Payer: Self-pay

## 2021-08-24 ENCOUNTER — Other Ambulatory Visit: Payer: Self-pay

## 2021-08-24 ENCOUNTER — Other Ambulatory Visit: Payer: Self-pay | Admitting: Pharmacist

## 2021-08-24 DIAGNOSIS — E1169 Type 2 diabetes mellitus with other specified complication: Secondary | ICD-10-CM

## 2021-08-24 MED ORDER — TRUE METRIX BLOOD GLUCOSE TEST VI STRP
ORAL_STRIP | 0 refills | Status: DC
  Filled 2021-08-24: qty 100, 33d supply, fill #0

## 2021-08-24 MED ORDER — TRUEPLUS LANCETS 28G MISC
0 refills | Status: DC
  Filled 2021-08-24: qty 100, 33d supply, fill #0

## 2021-08-25 ENCOUNTER — Other Ambulatory Visit: Payer: Self-pay

## 2021-09-08 ENCOUNTER — Ambulatory Visit: Payer: Self-pay | Attending: Internal Medicine | Admitting: Internal Medicine

## 2021-09-08 DIAGNOSIS — Z538 Procedure and treatment not carried out for other reasons: Secondary | ICD-10-CM

## 2021-09-08 NOTE — Progress Notes (Signed)
Patient was on my schedule for telephone visit.  However when I called the patient using [place] interpreters Hardie Pulley [phone]) patient stated that she had called earlier in the week and spoke with somebody and requested that this appointment be canceled and that she be scheduled for an in person visit.  I told patient that I will send a message to the schedulers so that she can be scheduled for an in person visit.  Informed her that we were doing telephone and video visits this week as our office is currently close as we prepare to move to our new site next week.

## 2021-09-19 ENCOUNTER — Other Ambulatory Visit: Payer: Self-pay

## 2021-09-19 DIAGNOSIS — Z1231 Encounter for screening mammogram for malignant neoplasm of breast: Secondary | ICD-10-CM

## 2021-09-27 ENCOUNTER — Ambulatory Visit: Payer: No Typology Code available for payment source | Admitting: Internal Medicine

## 2021-09-27 ENCOUNTER — Other Ambulatory Visit: Payer: Self-pay

## 2021-09-27 ENCOUNTER — Encounter: Payer: Self-pay | Admitting: Internal Medicine

## 2021-09-27 ENCOUNTER — Ambulatory Visit: Payer: Self-pay | Attending: Internal Medicine | Admitting: Internal Medicine

## 2021-09-27 VITALS — BP 130/83 | HR 81 | Resp 16 | Wt 202.4 lb

## 2021-09-27 DIAGNOSIS — R030 Elevated blood-pressure reading, without diagnosis of hypertension: Secondary | ICD-10-CM

## 2021-09-27 DIAGNOSIS — Z1159 Encounter for screening for other viral diseases: Secondary | ICD-10-CM

## 2021-09-27 DIAGNOSIS — D509 Iron deficiency anemia, unspecified: Secondary | ICD-10-CM

## 2021-09-27 DIAGNOSIS — E669 Obesity, unspecified: Secondary | ICD-10-CM

## 2021-09-27 DIAGNOSIS — E1169 Type 2 diabetes mellitus with other specified complication: Secondary | ICD-10-CM

## 2021-09-27 DIAGNOSIS — Z2821 Immunization not carried out because of patient refusal: Secondary | ICD-10-CM

## 2021-09-27 DIAGNOSIS — Z1211 Encounter for screening for malignant neoplasm of colon: Secondary | ICD-10-CM

## 2021-09-27 DIAGNOSIS — Z6833 Body mass index (BMI) 33.0-33.9, adult: Secondary | ICD-10-CM

## 2021-09-27 DIAGNOSIS — E785 Hyperlipidemia, unspecified: Secondary | ICD-10-CM

## 2021-09-27 DIAGNOSIS — L309 Dermatitis, unspecified: Secondary | ICD-10-CM

## 2021-09-27 LAB — POCT GLYCOSYLATED HEMOGLOBIN (HGB A1C): HbA1c, POC (controlled diabetic range): 8 % — AB (ref 0.0–7.0)

## 2021-09-27 LAB — GLUCOSE, POCT (MANUAL RESULT ENTRY): POC Glucose: 162 mg/dl — AB (ref 70–99)

## 2021-09-27 MED ORDER — GLIMEPIRIDE 4 MG PO TABS
4.0000 mg | ORAL_TABLET | Freq: Every day | ORAL | 1 refills | Status: DC
  Filled 2021-09-27: qty 30, 30d supply, fill #0
  Filled 2021-10-27 – 2021-11-14 (×3): qty 30, 30d supply, fill #1
  Filled 2021-12-19: qty 30, 30d supply, fill #2
  Filled 2022-01-23: qty 30, 30d supply, fill #3
  Filled 2022-02-21: qty 30, 30d supply, fill #4
  Filled 2022-03-29: qty 30, 30d supply, fill #5

## 2021-09-27 MED ORDER — ATORVASTATIN CALCIUM 10 MG PO TABS
ORAL_TABLET | Freq: Every day | ORAL | 1 refills | Status: DC
  Filled 2021-09-27: qty 90, 90d supply, fill #0
  Filled 2022-01-23: qty 30, 30d supply, fill #1
  Filled 2022-03-15 – 2022-04-07 (×2): qty 30, 30d supply, fill #2

## 2021-09-27 NOTE — Progress Notes (Addendum)
Patient ID: Morgan Ramsey, female    DOB: [birth year]  MRN: [id number]  CC: Diabetes   Subjective: Morgan Ramsey is a 46 y.o. female who presents for chronic ds management.  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  Also was out of Amaryl x several mths, just got it RF 09/11/2022.  Reports refills were decline.  Likely due to lack of f/u.  Pt states she was not sure if she was going to be called with an appt Medication side effects:  '[]'  Yes    '[]'  No Home Monitoring?  '[x]'  Yes - mornings before BF and at bedtime Home glucose results range: 140s in a.m; 101-130 at bedtime.  She is fasting this a.m. Diet Adherence: '[x]'  Yes    '[]'  No Exercise: '[]'  Yes    '[x]'  No -reports she does not exercise because afraid to go outside and does not drive Hypoglycemic episodes?: '[]'  Yes    '[]'  No Numbness of the feet? '[]'  Yes    '[x]'  No Retinopathy hx? '[]'  Yes    '[]'  No Last eye exam:  no blurred vision.  Over due for eye exam.  No insurance Comments:   HL:  out of Lipitor x 3 mths for same reason listed above for being out of her diabetic medications.    C/o itchiness and spots on arms x 1 yr.  C/o similar issue on visit 07/16/20.  Dx with possible fungal type rash at that time.  Given Miconazole cream.  Helped a little bit but "it started again."   Some itching in eyes at times LT>RT x 8 mths.  Some dischg at times.  Endorses redness.  No pain in eye  but burning at times.  No itchy throat  HM:  due for eye exam, flu vaccine, colon CA screen.  She declines flu shot.  Will be due for repeat pap 11/2021 she already has this scheduled through the [facility] program. Patient Active Problem List   Diagnosis Date Noted   Pap smear of cervix shows high risk HPV present 11/15/2020   Influenza vaccine refused 07/16/2020   Hyperlipidemia associated with type 2 diabetes mellitus ([place]) 06/09/2020   Statins contraindicated 06/09/2020   Microalbuminuria due to type 2 diabetes mellitus ([place]) 06/09/2020   Type 2 diabetes mellitus with obesity ([place]) 06/04/2020   Polyhydramnios, antepartum complication 06/07/8526   Polyhydramnios in third trimester 08/13/2019   Unstable lie of fetus 08/06/2019   Obesity (BMI 30.0-34.9) 05/07/2019   Transient hypertension of pregnancy 05/07/2019   Language barrier 04/26/2015   H/O herpes simplex type 2 infection 04/26/2015   Previous cesarean section 04/26/2015   Obesity in pregnancy 04/26/2015   Anemia 04/26/2015     Current Outpatient Medications on File Prior to Visit  Medication Sig Dispense Refill   Blood Glucose  Monitoring Suppl (TRUE METRIX METER) w/Device KIT Use as directed (Patient not taking: Reported on 11/15/2020) 1 kit 0   glucose blood (TRUE METRIX BLOOD GLUCOSE TEST) test strip Use to check blood sugar up to 3 times daily. (Patient not taking: Reported on 09/27/2021) 100 each 0   Insulin Pen Needle (PEN NEEDLES) 31G X 8 MM MISC UAD (Patient not taking: Reported on 09/27/2021) 100 each 6   TRUEplus Lancets 28G MISC Use to check blood sugar up to 3 times daily. (Patient not taking: Reported on 09/27/2021) 100 each 0   [DISCONTINUED] metFORMIN (GLUCOPHAGE) 1000 MG tablet Take 1 tablet (1,000 mg total) by mouth 2 (two) times daily with a meal. 60 tablet 6   No current facility-administered medications on file prior to visit.    Allergies  Allergen Reactions   Metformin And Related     Diarrhea    Social  History   Socioeconomic History   Marital status: Married    Spouse name: Radio producer   Number of children: 4   Years of education: Not on file   Highest education level: 6th grade  Occupational History    Comment: unemployed  Tobacco Use   Smoking status: Never   Smokeless tobacco: Never  Vaping Use   Vaping Use: Never used  Substance and Sexual Activity   Alcohol use: No   Drug use: No   Sexual activity: Not Currently    Birth control/protection: Condom  Other Topics Concern   Not on file  Social History Narrative   Not on file   Social Determinants of Health   Financial Resource Strain: Not on file  Food Insecurity: Not on file  Transportation Needs: Not on file  Physical Activity: Not on file  Stress: Not on file  Social Connections: Not on file  Intimate Partner Violence: Not on file    Family History  Problem Relation Age of Onset   Diabetes Mother        oral agents   Hypertension Mother    Hypertension Father    Hypertension Brother    Breast cancer Cousin    Anesthesia problems Neg Hx    Hypotension Neg Hx    Malignant hyperthermia Neg Hx    Pseudochol deficiency Neg Hx     Past Surgical History:  Procedure Laterality Date   CESAREAN SECTION  05/21/2011   Procedure: CESAREAN SECTION;  Surgeon: Jonnie Kind, MD;  Location: [facility];  Service: Gynecology;  Laterality: N/A;  Primary cesarean section with delivery of baby girl at 69. Apgars 9/9.   HERPES SIMPLEX VIRUS DFA     last outbreak prior to 2017    ROS: Review of Systems Negative except as stated above  PHYSICAL EXAM: BP 130/83    Pulse 81    Resp 16    Wt 202 lb 6.4 oz (91.8 kg)    SpO2 100%    BMI 33.68 kg/m   Wt Readings from Last 3 Encounters:  09/27/21 202 lb 6.4 oz (91.8 kg)  11/15/20 208 lb 1.6 oz (94.4 kg)  10/21/20 207 lb 4.8 oz (94 kg)    Physical Exam  General appearance - alert, well appearing, and in no distress Mental status - normal mood, behavior, speech,  dress, motor activity, and thought processes Eyes - pupils equal and reactive, extraocular eye movements intact Neck - supple, no significant adenopathy Chest - clear to auscultation, no wheezes, rales or rhonchi, symmetric air entry Heart - normal rate, regular rhythm,  normal S1, S2, no murmurs, rubs, clicks or gallops Extremities - peripheral pulses normal, no pedal edema, no clubbing or cyanosis Skin -patient has small circular hypopigmented patches on both forearms and several scattered over the face. Diabetic Foot Exam - Simple   Simple Foot Form Diabetic Foot exam was performed with the following findings: Yes 09/27/2021 10:27 AM  Visual Inspection No deformities, no ulcerations, no other skin breakdown bilaterally: Yes Sensation Testing Intact to touch and monofilament testing bilaterally: Yes Pulse Check Posterior Tibialis and Dorsalis pulse intact bilaterally: Yes Comments      CMP Latest Ref Rng & Units 06/07/2020 09/25/2019 03/13/2019  Glucose 65 - 99 mg/dL 284(H) 224(H) 161(H)  BUN 6 - 24 mg/dL '10 12 8  ' Creatinine 0.57 - 1.00 mg/dL 0.61 0.83 0.55(L)  Sodium 134 - 144 mmol/L 136 137 136  Potassium 3.5 - 5.2 mmol/L 3.8 3.5 4.0  Chloride 96 - 106 mmol/L 97 103 100  CO2 20 - 29 mmol/L '23 25 20  ' Calcium 8.7 - 10.2 mg/dL 8.9 8.9 8.8  Total Protein 6.0 - 8.5 g/dL 7.6 6.8 6.8  Total Bilirubin 0.0 - 1.2 mg/dL 0.3 0.2(L) <0.2  Alkaline Phos 44 - 121 IU/L 143(H) 103 92  AST 0 - 40 IU/L '14 29 12  ' ALT 0 - 32 IU/L 18 35 7   Lipid Panel     Component Value Date/Time   CHOL 183 06/07/2020 0940   TRIG 131 06/07/2020 0940   HDL 49 06/07/2020 0940   CHOLHDL 3.7 06/07/2020 0940   LDLCALC 111 (H) 06/07/2020 0940    CBC    Component Value Date/Time   WBC 6.4 06/07/2020 0940   WBC 7.9 09/25/2019 2122   RBC 5.34 (H) 06/07/2020 0940   RBC 4.81 09/25/2019 2122   HGB 12.9 06/07/2020 0940   HGB 9.3 04/02/2015 0000   HCT 40.8 06/07/2020 0940   HCT 32 04/02/2015 0000   PLT 268  06/07/2020 0940   PLT 334 04/02/2015 0000   MCV 76 (L) 06/07/2020 0940   MCH 24.2 (L) 06/07/2020 0940   MCH 23.5 (L) 09/25/2019 2122   MCHC 31.6 06/07/2020 0940   MCHC 31.1 09/25/2019 2122   RDW 13.3 06/07/2020 0940   LYMPHSABS 1.3 03/13/2019 1206   MONOABS 0.9 07/14/2013 0228   EOSABS 0.1 03/13/2019 1206   BASOSABS 0.0 03/13/2019 1206    ASSESSMENT AND PLAN: 1. Type 2 diabetes mellitus with obesity (HCC) Not at goal.  Based on her A1c at this time, I think we can get away with just keeping her on the Amaryl without having to restart the insulin.  She would be a good candidate for Trulicity alone but we are running into supply issues with Trulicity so we will hold off on trying to put her on that and just keep her on Amaryl instead. Discussed the importance of healthy eating habits, regular aerobic exercise (at least 150 minutes a week as tolerated) and medication compliance to achieve or maintain control of diabetes. -Discussed exercise that she can do indoors like walking in place while watching TV or listening to music 3 to 5 days a week for 30 minutes. -Encouraged to get eye exam when she is able to afford. - POCT glucose (manual entry) - POCT glycosylated hemoglobin (Hb A1C) - Microalbumin / creatinine urine ratio - CBC - Comprehensive metabolic panel - Lipid panel - glimepiride (AMARYL) 4 MG tablet; Take 1 tablet (4 mg total) by mouth daily with breakfast.  Dispense: 90 tablet;  Refill: 1  2. Hyperlipidemia associated with type 2 diabetes mellitus (HCC) - atorvastatin (LIPITOR) 10 MG tablet; TAKE 1 TABLET (10 MG TOTAL) BY MOUTH DAILY.  Dispense: 90 tablet; Refill: 1  3. Elevated blood pressure reading without diagnosis of hypertension DASH diet discussed and encouraged.  Follow-up with clinical pharmacist in 4 to 6 weeks for repeat blood pressure check.  4. Dermatitis Looks like vitiligo.  Recommend that she apply for the orange card/cone discount and once approved, she can  let me know so that I can submit referral to dermatology.  5. Screening for colon cancer - Fecal occult blood, imunochemical  6. Need for hepatitis C screening test - HCV Ab w Reflex to Quant PCR  7. Influenza vaccination declined  [employer] Language interpreter used during this encounter. #[id number], [place]  Patient was given the opportunity to ask questions.  Patient verbalized understanding of the plan and was able to repeat key elements of the plan.   Addendum 09/28/2021: Patient with mild anemia on blood test.  We will add iron studies to blood that has already been drawn. Orders Placed This Encounter  Procedures   Fecal occult blood, imunochemical   Microalbumin / creatinine urine ratio   HCV Ab w Reflex to Quant PCR   CBC   Comprehensive metabolic panel   Lipid panel   POCT glucose (manual entry)   POCT glycosylated hemoglobin (Hb A1C)     Requested Prescriptions   Signed Prescriptions Disp Refills   glimepiride (AMARYL) 4 MG tablet 90 tablet 1    Sig: Take 1 tablet (4 mg total) by mouth daily with breakfast.   atorvastatin (LIPITOR) 10 MG tablet 90 tablet 1    Sig: TAKE 1 TABLET (10 MG TOTAL) BY MOUTH DAILY.    Return in about 4 months (around 01/25/2022) for Appt with [facility] in 4 wks for BP check.  Karle Plumber, MD, FACP

## 2021-09-28 LAB — LIPID PANEL
Chol/HDL Ratio: 2.9 ratio (ref 0.0–4.4)
Cholesterol, Total: 169 mg/dL (ref 100–199)
HDL: 58 mg/dL (ref >39)
LDL Chol Calc (NIH): 90 mg/dL (ref 0–99)
Triglycerides: 119 mg/dL (ref 0–149)
VLDL Cholesterol Cal: 21 mg/dL (ref 5–40)

## 2021-09-28 LAB — MICROALBUMIN / CREATININE URINE RATIO
Creatinine, Urine: 57.2 mg/dL
Microalb/Creat Ratio: 54 mg/g creat — ABNORMAL HIGH (ref 0–29)
Microalbumin, Urine: 31 ug/mL

## 2021-09-28 LAB — COMPREHENSIVE METABOLIC PANEL
ALT: 17 IU/L (ref 0–32)
AST: 17 IU/L (ref 0–40)
Albumin/Globulin Ratio: 1.4 (ref 1.2–2.2)
Albumin: 4.5 g/dL (ref 3.8–4.8)
Alkaline Phosphatase: 129 IU/L — ABNORMAL HIGH (ref 44–121)
BUN/Creatinine Ratio: 13 (ref 9–23)
BUN: 9 mg/dL (ref 6–24)
Bilirubin Total: 0.2 mg/dL (ref 0.0–1.2)
CO2: 24 mmol/L (ref 20–29)
Calcium: 9.1 mg/dL (ref 8.7–10.2)
Chloride: 103 mmol/L (ref 96–106)
Creatinine, Ser: 0.7 mg/dL (ref 0.57–1.00)
Globulin, Total: 3.3 g/dL (ref 1.5–4.5)
Glucose: 140 mg/dL — ABNORMAL HIGH (ref 70–99)
Potassium: 4.3 mmol/L (ref 3.5–5.2)
Sodium: 139 mmol/L (ref 134–144)
Total Protein: 7.8 g/dL (ref 6.0–8.5)
eGFR: 108 mL/min/{1.73_m2} (ref >59)

## 2021-09-28 LAB — HCV INTERPRETATION

## 2021-09-28 LAB — CBC
Hematocrit: 34.1 % (ref 34.0–46.6)
Hemoglobin: 10.3 g/dL — ABNORMAL LOW (ref 11.1–15.9)
MCH: 20.5 pg — ABNORMAL LOW (ref 26.6–33.0)
MCHC: 30.2 g/dL — ABNORMAL LOW (ref 31.5–35.7)
MCV: 68 fL — ABNORMAL LOW (ref 79–97)
Platelets: 398 10*3/uL (ref 150–450)
RBC: 5.02 x10E6/uL (ref 3.77–5.28)
RDW: 15.2 % (ref 11.7–15.4)
WBC: 7 10*3/uL (ref 3.4–10.8)

## 2021-09-28 LAB — HCV AB W REFLEX TO QUANT PCR: HCV Ab: NONREACTIVE

## 2021-09-28 NOTE — Addendum Note (Signed)
Addended by: Karle Plumber B on: 09/28/2021 09:18 AM   Modules accepted: Orders

## 2021-09-29 ENCOUNTER — Other Ambulatory Visit: Payer: Self-pay

## 2021-09-29 ENCOUNTER — Other Ambulatory Visit: Payer: Self-pay | Admitting: Internal Medicine

## 2021-09-29 LAB — IRON,TIBC AND FERRITIN PANEL
Ferritin: 6 ng/mL — ABNORMAL LOW (ref 15–150)
Iron Saturation: 4 % — CL (ref 15–55)
Iron: 19 ug/dL — ABNORMAL LOW (ref 27–159)
Total Iron Binding Capacity: 485 ug/dL — ABNORMAL HIGH (ref 250–450)
UIBC: 466 ug/dL — ABNORMAL HIGH (ref 131–425)

## 2021-09-29 LAB — SPECIMEN STATUS REPORT

## 2021-09-29 MED ORDER — FERROUS SULFATE 325 (65 FE) MG PO TABS
325.0000 mg | ORAL_TABLET | Freq: Every day | ORAL | 1 refills | Status: DC
  Filled 2021-09-29: qty 30, 30d supply, fill #0
  Filled 2021-11-14: qty 100, 100d supply, fill #0

## 2021-10-06 ENCOUNTER — Other Ambulatory Visit: Payer: Self-pay

## 2021-10-25 ENCOUNTER — Ambulatory Visit: Payer: No Typology Code available for payment source | Admitting: Internal Medicine

## 2021-10-26 ENCOUNTER — Ambulatory Visit: Payer: No Typology Code available for payment source | Admitting: Pharmacist

## 2021-10-27 ENCOUNTER — Other Ambulatory Visit: Payer: Self-pay | Admitting: Internal Medicine

## 2021-10-27 ENCOUNTER — Other Ambulatory Visit: Payer: Self-pay

## 2021-10-27 ENCOUNTER — Ambulatory Visit: Payer: No Typology Code available for payment source | Admitting: Pharmacist

## 2021-10-27 MED ORDER — TRUEPLUS LANCETS 28G MISC
2 refills | Status: DC
  Filled 2021-10-27 – 2021-11-14 (×3): qty 100, 33d supply, fill #0
  Filled 2022-01-23: qty 100, 33d supply, fill #1
  Filled 2022-10-24: qty 100, 33d supply, fill #2

## 2021-10-27 MED ORDER — TRUE METRIX BLOOD GLUCOSE TEST VI STRP
ORAL_STRIP | 2 refills | Status: DC
  Filled 2021-10-27 – 2021-11-14 (×3): qty 100, 33d supply, fill #0
  Filled 2022-01-23: qty 100, 33d supply, fill #1
  Filled 2022-10-24: qty 100, 33d supply, fill #2

## 2021-10-28 ENCOUNTER — Other Ambulatory Visit: Payer: Self-pay

## 2021-11-03 ENCOUNTER — Other Ambulatory Visit: Payer: Self-pay

## 2021-11-10 ENCOUNTER — Other Ambulatory Visit: Payer: Self-pay

## 2021-11-14 ENCOUNTER — Other Ambulatory Visit: Payer: Self-pay

## 2021-11-15 ENCOUNTER — Other Ambulatory Visit: Payer: Self-pay

## 2021-11-29 ENCOUNTER — Ambulatory Visit: Payer: Self-pay

## 2021-12-20 ENCOUNTER — Other Ambulatory Visit: Payer: Self-pay

## 2021-12-26 ENCOUNTER — Other Ambulatory Visit: Payer: Self-pay

## 2021-12-29 ENCOUNTER — Telehealth: Payer: No Typology Code available for payment source | Admitting: Family Medicine

## 2021-12-29 DIAGNOSIS — B3731 Acute candidiasis of vulva and vagina: Secondary | ICD-10-CM

## 2021-12-29 DIAGNOSIS — N760 Acute vaginitis: Secondary | ICD-10-CM

## 2021-12-29 DIAGNOSIS — B9689 Other specified bacterial agents as the cause of diseases classified elsewhere: Secondary | ICD-10-CM

## 2021-12-29 MED ORDER — METRONIDAZOLE 500 MG PO TABS: 500.0000 mg | ORAL_TABLET | Freq: Two times a day (BID) | ORAL | 0 refills | Status: AC

## 2021-12-29 MED ORDER — FLUCONAZOLE 150 MG PO TABS: 150.0000 mg | ORAL_TABLET | Freq: Once | ORAL | 0 refills | Status: AC

## 2021-12-29 NOTE — Progress Notes (Signed)
E-Visit for Vaginal Symptoms  We are sorry that you are not feeling well. Here is how we plan to help! Based on what you shared with me it looks like you: May have a vaginosis due to bacteria and yeast   Vaginosis is an inflammation of the vagina that can result in discharge, itching and pain. The cause is usually a change in the normal balance of vaginal bacteria or an infection. Vaginosis can also result from reduced estrogen levels after menopause.  The most common causes of vaginosis are:   Bacterial vaginosis which results from an overgrowth of one on several organisms that are normally present in your vagina.   Yeast infections which are caused by a naturally occurring fungus called candida.   Vaginal atrophy (atrophic vaginosis) which results from the thinning of the vagina from reduced estrogen levels after menopause.   Trichomoniasis which is caused by a parasite and is commonly transmitted by sexual intercourse.  Factors that increase your risk of developing vaginosis include: Medications, such as antibiotics and steroids Uncontrolled diabetes Use of hygiene products such as bubble bath, vaginal spray or vaginal deodorant Douching Wearing damp or tight-fitting clothing Using an intrauterine device (IUD) for birth control Hormonal changes, such as those associated with pregnancy, birth control pills or menopause Sexual activity Having a sexually transmitted infection  Your treatment plan is Metronidazole or Flagyl '500mg'$  twice a day for 7 days.  I have electronically sent this prescription into the pharmacy that you have chosen. And Diflucan 150 mg once for yeast.  Be sure to take all of the medication as directed. Stop taking any medication if you develop a rash, tongue swelling or shortness of breath. Mothers who are breast feeding should consider pumping and discarding their breast milk while on these antibiotics. However, there is no consensus that infant exposure at these  doses would be harmful.  Remember that medication creams can weaken latex condoms. Marland Kitchen   HOME CARE:  Good hygiene may prevent some types of vaginosis from recurring and may relieve some symptoms:  Avoid baths, hot tubs and whirlpool spas. Rinse soap from your outer genital area after a shower, and dry the area well to prevent irritation. Don't use scented or harsh soaps, such as those with deodorant or antibacterial action. Avoid irritants. These include scented tampons and pads. Wipe from front to back after using the toilet. Doing so avoids spreading fecal bacteria to your vagina.  Other things that may help prevent vaginosis include:  Don't douche. Your vagina doesn't require cleansing other than normal bathing. Repetitive douching disrupts the normal organisms that reside in the vagina and can actually increase your risk of vaginal infection. Douching won't clear up a vaginal infection. Use a latex condom. Both female and female latex condoms may help you avoid infections spread by sexual contact. Wear cotton underwear. Also wear pantyhose with a cotton crotch. If you feel comfortable without it, skip wearing underwear to bed. Yeast thrives in [employer] Your symptoms should improve in the next day or two.  GET HELP RIGHT AWAY IF:  You have pain in your lower abdomen ( pelvic area or over your ovaries) You develop nausea or vomiting You develop a fever Your discharge changes or worsens You have persistent pain with intercourse You develop shortness of breath, a rapid pulse, or you faint.  These symptoms could be signs of problems or infections that need to be evaluated by a medical provider now.  MAKE SURE YOU   Understand  these instructions. Will watch your condition. Will get help right away if you are not doing well or get worse.  Thank you for choosing an e-visit.  Your e-visit answers were reviewed by a board certified advanced clinical practitioner to complete  your personal care plan. Depending upon the condition, your plan could have included both over the counter or prescription medications.  Please review your pharmacy choice. Make sure the pharmacy is open so you can pick up prescription now. If there is a problem, you may contact your provider through [employer] and have the prescription routed to another pharmacy.  Your safety is important to [place]. If you have drug allergies check your prescription carefully.   For the next 24 hours you can use MyChart to ask questions about today's visit, request a non-urgent call back, or ask for a work or school excuse. You will get an email in the next two days asking about your experience. I hope that your e-visit has been valuable and will speed your recovery.  I provided 5 minutes of non face-to-face time during this encounter for chart review, medication and order placement, as well as and documentation.

## 2022-01-03 ENCOUNTER — Emergency Department (HOSPITAL_COMMUNITY)
Admission: EM | Admit: 2022-01-03 | Discharge: 2022-01-04 | Disposition: A | Discharge status: Home / Self Care | Payer: No Typology Code available for payment source | Attending: Emergency Medicine | Admitting: Emergency Medicine

## 2022-01-03 ENCOUNTER — Encounter (HOSPITAL_COMMUNITY): Payer: Self-pay | Admitting: Emergency Medicine

## 2022-01-03 ENCOUNTER — Other Ambulatory Visit: Payer: Self-pay

## 2022-01-03 DIAGNOSIS — J020 Streptococcal pharyngitis: Secondary | ICD-10-CM | POA: Insufficient documentation

## 2022-01-03 DIAGNOSIS — N3000 Acute cystitis without hematuria: Secondary | ICD-10-CM | POA: Insufficient documentation

## 2022-01-03 DIAGNOSIS — Z20822 Contact with and (suspected) exposure to covid-19: Secondary | ICD-10-CM | POA: Insufficient documentation

## 2022-01-03 MED ORDER — ACETAMINOPHEN 500 MG PO TABS
1000.0000 mg | ORAL_TABLET | Freq: Once | ORAL | Status: AC
Start: 2022-01-03 — End: 2022-01-03
  Administered 2022-01-03: 1000 mg via ORAL
  Filled 2022-01-03: qty 2

## 2022-01-03 NOTE — ED Notes (Signed)
Patient took tylenol at 1500.  Patient took theraflu at 28.

## 2022-01-03 NOTE — ED Triage Notes (Signed)
Spanish interpretor used for triage.  Patient c/o fever, chills, sore throat and bodyaches x 1 day. Patient has sick contacts within family.

## 2022-01-03 NOTE — ED Provider Triage Note (Signed)
Emergency Medicine Provider Triage Evaluation Note  Cylee Dattilo , a 46 y.o. female  was evaluated in triage.  Pt complains of fever, chills, bodyaches, sore throat.  States son is sick with "throat infection".  She has been taking motrin, theraflu, etc for symptoms.  Also reports pain with urination a few days ago.  Concerned for UTI.  Review of Systems  Positive: Fever, sore throat, bodyaches Negative: vomiting  Physical Exam  BP (!) 146/86 (BP Location: Right Arm)   Pulse (!) 132   Temp (!) 102.9 F (39.4 C) (Oral)   Resp (!) 24   SpO2 97%  Gen:   Awake, no distress   Resp:  Normal effort  MSK:   Moves extremities without difficulty  Other:    Medical Decision Making  Medically screening exam initiated at 11:25 PM.  Appropriate orders placed.  Kezia Camacho-Gonzalez was informed that the remainder of the evaluation will be completed by another provider, this initial triage assessment does not replace that evaluation, and the importance of remaining in the ED until their evaluation is complete.  Fever, sore throat, chills, bodyaches.  Will send strep and covid/flu screen.  Also reports recent dysuria, will send UA.   Larene Pickett, PA-C 01/03/22 [phone]

## 2022-01-04 ENCOUNTER — Ambulatory Visit (HOSPITAL_COMMUNITY)
Admission: EM | Admit: 2022-01-04 | Discharge: 2022-01-04 | Disposition: A | Discharge status: Home / Self Care | Payer: No Typology Code available for payment source | Attending: Family Medicine | Admitting: Family Medicine

## 2022-01-04 ENCOUNTER — Ambulatory Visit: Payer: Self-pay

## 2022-01-04 ENCOUNTER — Encounter (HOSPITAL_COMMUNITY): Payer: Self-pay

## 2022-01-04 DIAGNOSIS — J020 Streptococcal pharyngitis: Secondary | ICD-10-CM

## 2022-01-04 DIAGNOSIS — N3090 Cystitis, unspecified without hematuria: Secondary | ICD-10-CM

## 2022-01-04 LAB — URINALYSIS, ROUTINE W REFLEX MICROSCOPIC
Bilirubin Urine: NEGATIVE
Glucose, UA: NEGATIVE mg/dL
Hgb urine dipstick: NEGATIVE
Ketones, ur: NEGATIVE mg/dL
Nitrite: NEGATIVE
Protein, ur: 30 mg/dL — AB
Specific Gravity, Urine: 1.009 (ref 1.005–1.030)
pH: 5 (ref 5.0–8.0)

## 2022-01-04 LAB — RESP PANEL BY RT-PCR (FLU A&B, COVID) ARPGX2
Influenza A by PCR: NEGATIVE
Influenza B by PCR: NEGATIVE
SARS Coronavirus 2 by RT PCR: NEGATIVE

## 2022-01-04 LAB — GROUP A STREP BY PCR: Group A Strep by PCR: DETECTED — AB

## 2022-01-04 MED ORDER — DEXAMETHASONE 4 MG PO TABS
10.0000 mg | ORAL_TABLET | Freq: Once | ORAL | Status: AC
  Administered 2022-01-04: 10 mg via ORAL
  Filled 2022-01-04: qty 3

## 2022-01-04 MED ORDER — CEPHALEXIN 250 MG PO CAPS: 250.0000 mg | ORAL_CAPSULE | Freq: Four times a day (QID) | ORAL | 0 refills | Status: DC

## 2022-01-04 MED ORDER — AZITHROMYCIN 250 MG PO TABS: ORAL_TABLET | ORAL | 0 refills | Status: DC

## 2022-01-04 MED ORDER — NITROFURANTOIN MONOHYD MACRO 100 MG PO CAPS: 100.0000 mg | ORAL_CAPSULE | Freq: Two times a day (BID) | ORAL | 0 refills | Status: DC

## 2022-01-04 MED ORDER — CEPHALEXIN 250 MG PO CAPS
1000.0000 mg | ORAL_CAPSULE | Freq: Once | ORAL | Status: AC
  Administered 2022-01-04: 1000 mg via ORAL
  Filled 2022-01-04: qty 4

## 2022-01-04 NOTE — ED Notes (Signed)
Patient verbalizes understanding of discharge instructions. Opportunity for questioning and answers were provided. Armband removed by staff, pt discharged from ED pt ambulatory to lobby

## 2022-01-04 NOTE — ED Triage Notes (Signed)
Pt was started on keflex yesterday and reports feeling like she was having sob after taking the meds. Pt is suspicious for med allergy and would like a new antibiotic to tx initial sxs.

## 2022-01-04 NOTE — Discharge Instructions (Addendum)
Stop taking cephalexin.  Take azithromycin--250 mg--2 the first day, then 1 daily for 4 more days. This is for strep.(Tome 2 tabletas por la boca Software engineer dia, y entonces 1 diaria por 4 mas dias). Es for strep  Take nitrofurantoin 100 mg--1 capsule 2 times daily for 5 days for urinary infection(tome 1 capsula 2 veces al dia por 5 dias para la [place])

## 2022-01-04 NOTE — ED Provider Notes (Signed)
[facility] Provider Note   CSN: [id number] Arrival date & time: 01/03/22  2317     History  No chief complaint on file.   Morgan Ramsey is a 46 y.o. female.  47 year old female presents the ER with multiple symptoms.  Morgan Ramsey had body aches, sore throat, runny nose, earache, sinus pain and burning when Morgan Ramsey urinates for the last couple days.  Morgan Ramsey has had fevers as well.  Morgan Ramsey presents here for further evaluation.  Morgan Ramsey has a child with throat infection.       Home Medications Prior to Admission medications   Medication Sig Start Date End Date Taking? Authorizing Provider  cephALEXin (KEFLEX) 250 MG capsule Take 1 capsule (250 mg total) by mouth 4 (four) times daily. 01/04/22  Yes Jenisha Faison, Corene Cornea, MD  atorvastatin (LIPITOR) 10 MG tablet TAKE 1 TABLET (10 MG TOTAL) BY MOUTH DAILY. 09/27/21   Ladell Pier, MD  ferrous sulfate 325 (65 FE) MG tablet Take 1 tablet (325 mg total) by mouth daily with breakfast. 09/29/21   Ladell Pier, MD  glimepiride (AMARYL) 4 MG tablet Take 1 tablet (4 mg total) by mouth daily with breakfast. 09/27/21   Ladell Pier, MD  glucose blood (TRUE METRIX BLOOD GLUCOSE TEST) test strip Use to check blood sugar up to 3 times daily. 10/27/21   Ladell Pier, MD  metroNIDAZOLE (FLAGYL) 500 MG tablet Take 1 tablet (500 mg total) by mouth 2 (two) times daily for 7 days. 12/29/21 01/05/22  Perlie Mayo, NP  TRUEplus Lancets 28G MISC Use to check blood sugar up to 3 times daily. 10/27/21   Ladell Pier, MD  metFORMIN (GLUCOPHAGE) 1000 MG tablet Take 1 tablet (1,000 mg total) by mouth 2 (two) times daily with a meal. 07/16/20 09/14/20  Ladell Pier, MD      Allergies    Metformin and related    Review of Systems   Review of Systems  Physical Exam Updated Vital Signs BP (!) 142/71   Pulse 98   Temp 98.7 F (37.1 C) (Oral)   Resp 18   Ht '5\' 5"'$  (1.651 m)   Wt 90 kg   SpO2 100%   BMI 33.02  kg/m  Physical Exam Vitals and nursing note reviewed.  Constitutional:      Appearance: Morgan Ramsey is well-developed.  HENT:     Head: Normocephalic and atraumatic.     Nose: Congestion and rhinorrhea present.     Mouth/Throat:     Mouth: Mucous membranes are moist.     Pharynx: Posterior oropharyngeal erythema present.  Cardiovascular:     Rate and Rhythm: Normal rate and regular rhythm.  Pulmonary:     Effort: No respiratory distress.     Breath sounds: No stridor.  Abdominal:     General: Abdomen is flat. There is no distension.  Musculoskeletal:        General: No swelling or tenderness. Normal range of motion.     Cervical back: Normal range of motion.  Skin:    General: Skin is warm and dry.  Neurological:     General: No focal deficit present.     Mental Status: Morgan Ramsey is alert.    ED Results / Procedures / Treatments   Labs (all labs ordered are listed, but only abnormal results are displayed) Labs Reviewed  GROUP A STREP BY PCR - Abnormal; Notable for the following components:      Result Value   Group  A Strep by PCR DETECTED (*)    All other components within normal limits  URINALYSIS, ROUTINE W REFLEX MICROSCOPIC - Abnormal; Notable for the following components:   APPearance HAZY (*)    Protein, ur 30 (*)    Leukocytes,Ua MODERATE (*)    Bacteria, UA RARE (*)    All other components within normal limits  RESP PANEL BY RT-PCR (FLU A&B, COVID) [id number]  URINE CULTURE    EKG None  Radiology No results found.  Procedures Procedures    Medications Ordered in ED Medications  acetaminophen (TYLENOL) tablet 1,000 mg (1,000 mg Oral Given 01/03/22 2335)  cephALEXin (KEFLEX) capsule 1,000 mg (1,000 mg Oral Given 01/04/22 0317)  dexamethasone (DECADRON) tablet 10 mg (10 mg Oral Given 01/04/22 6160)    ED Course/ Medical Decision Making/ A&P                           Medical Decision Making Amount and/or Complexity of Data Reviewed Labs:  ordered.  Risk Prescription drug management.   Uncomplicated Strep pharyngitis with likely UTI as well.  Culture added on.  Antibiotic started cover both.  Steroids for symptom control.   Final Clinical Impression(s) / ED Diagnoses Final diagnoses:  Acute cystitis without hematuria  Strep throat    Rx / DC Orders ED Discharge Orders          Ordered    cephALEXin (KEFLEX) 250 MG capsule  4 times daily        01/04/22 0309              Symir Mah, Corene Cornea, MD 01/04/22 [phone]

## 2022-01-04 NOTE — Telephone Encounter (Signed)
FYI  Patient states going to ED

## 2022-01-04 NOTE — ED Provider Notes (Addendum)
[facility]    CSN: [id number] Arrival date & time: 01/04/22  [place]      History   Chief Complaint Chief Complaint  Patient presents with   Allergic Reaction    HPI Morgan Ramsey is a 46 y.o. female.    Allergic Reaction Here for alternate rx for strep positive and for UTI.  She was seen in the ER yesterday evening, and strep was positive; had had fever and sore throat.   She also has had burning in her urethra area, and UA dip showed pyuria; c/s pending.  She felt short of breath and tight in her chest when she took her first dose of keflex this AM.  No v/d LMP was about 3 weeks ago Past Medical History:  Diagnosis Date   AMA (advanced maternal age) multigravida 35+    Gestational diabetes 2017   glyburide   Herpes    History of candidal vulvovaginitis    History of chlamydia 2008   Hyperlipidemia    Obese     Patient Active Problem List   Diagnosis Date Noted   Pap smear of cervix shows high risk HPV present 11/15/2020   Influenza vaccine refused 07/16/2020   Hyperlipidemia associated with type 2 diabetes mellitus ([place]) 06/09/2020   Statins contraindicated 06/09/2020   Microalbuminuria due to type 2 diabetes mellitus ([place]) 06/09/2020   Type 2 diabetes mellitus with obesity ([place]) 06/04/2020   Polyhydramnios, antepartum complication 20/94/7096   Polyhydramnios in third trimester 08/13/2019   Unstable lie of fetus 08/06/2019   Obesity (BMI 30.0-34.9) 05/07/2019   Transient hypertension of pregnancy 05/07/2019   Language barrier 04/26/2015   H/O herpes simplex type 2 infection 04/26/2015   Previous cesarean section 04/26/2015   Obesity in pregnancy 04/26/2015   Anemia 04/26/2015    Past Surgical History:  Procedure Laterality Date   CESAREAN SECTION  05/21/2011   Procedure: CESAREAN SECTION;  Surgeon: Jonnie Kind, MD;  Location: [facility];  Service: Gynecology;  Laterality: N/A;  Primary cesarean section with delivery of baby girl at  15. Apgars 9/9.   HERPES SIMPLEX VIRUS DFA     last outbreak prior to 2017        Home Medications    Prior to Admission medications   Medication Sig Start Date End Date Taking? Authorizing Provider  azithromycin (ZITHROMAX) 250 MG tablet Take first 2 tablets together, then 1 every day until finished. 01/04/22  Yes Barrett Henle, MD  nitrofurantoin, macrocrystal-monohydrate, (MACROBID) 100 MG capsule Take 1 capsule (100 mg total) by mouth 2 (two) times daily. 01/04/22  Yes Barrett Henle, MD  atorvastatin (LIPITOR) 10 MG tablet TAKE 1 TABLET (10 MG TOTAL) BY MOUTH DAILY. 09/27/21   Ladell Pier, MD  ferrous sulfate 325 (65 FE) MG tablet Take 1 tablet (325 mg total) by mouth daily with breakfast. 09/29/21   Ladell Pier, MD  glimepiride (AMARYL) 4 MG tablet Take 1 tablet (4 mg total) by mouth daily with breakfast. 09/27/21   Ladell Pier, MD  glucose blood (TRUE METRIX BLOOD GLUCOSE TEST) test strip Use to check blood sugar up to 3 times daily. 10/27/21   Ladell Pier, MD  metroNIDAZOLE (FLAGYL) 500 MG tablet Take 1 tablet (500 mg total) by mouth 2 (two) times daily for 7 days. 12/29/21 01/05/22  Perlie Mayo, NP  TRUEplus Lancets 28G MISC Use to check blood sugar up to 3 times daily. 10/27/21   Ladell Pier, MD  metFORMIN (  GLUCOPHAGE) 1000 MG tablet Take 1 tablet (1,000 mg total) by mouth 2 (two) times daily with a meal. 07/16/20 09/14/20  Ladell Pier, MD    Family History Family History  Problem Relation Age of Onset   Diabetes Mother        oral agents   Hypertension Mother    Hypertension Father    Hypertension Brother    Breast cancer Cousin    Anesthesia problems Neg Hx    Hypotension Neg Hx    Malignant hyperthermia Neg Hx    Pseudochol deficiency Neg Hx     Social History Social History   Tobacco Use   Smoking status: Never   Smokeless tobacco: Never  Vaping Use   Vaping Use: Never used  Substance Use Topics   Alcohol use:  No   Drug use: No     Allergies   Metformin and related   Review of Systems Review of Systems   Physical Exam Triage Vital Signs ED Triage Vitals [01/04/22 1933]  Enc Vitals Group     BP 134/77     Pulse Rate 87     Resp 18     Temp 99.3 F (37.4 C)     Temp Source Oral     SpO2 97 %     Weight      Height      Head Circumference      Peak Flow      Pain Score 0     Pain Loc      Pain Edu?      Excl. in [place]?    No data found.  Updated Vital Signs BP 134/77 (BP Location: Left Arm)   Pulse 87   Temp 99.3 F (37.4 C) (Oral)   Resp 18   SpO2 97%   Visual Acuity Right Eye Distance:   Left Eye Distance:   Bilateral Distance:    Right Eye Near:   Left Eye Near:    Bilateral Near:     Physical Exam Vitals reviewed.  Constitutional:      General: She is not in acute distress.    Appearance: She is not toxic-appearing.  HENT:     Nose: Nose normal.     Mouth/Throat:     Mouth: Mucous membranes are moist.     Comments: There is erythema of the OP with white exudate on the tonsils Eyes:     Extraocular Movements: Extraocular movements intact.     Conjunctiva/sclera: Conjunctivae normal.     Pupils: Pupils are equal, round, and reactive to light.  Cardiovascular:     Rate and Rhythm: Normal rate and regular rhythm.     Heart sounds: No murmur heard. Pulmonary:     Effort: Pulmonary effort is normal. No respiratory distress.     Breath sounds: No wheezing, rhonchi or rales.  Abdominal:     Palpations: Abdomen is soft.     Tenderness: There is no abdominal tenderness.  Musculoskeletal:     Cervical back: Neck supple.  Lymphadenopathy:     Cervical: No cervical adenopathy.  Skin:    Capillary Refill: Capillary refill takes less than 2 seconds.     Coloration: Skin is not jaundiced or pale.  Neurological:     General: No focal deficit present.     Mental Status: She is alert and oriented to person, place, and time.  Psychiatric:        Behavior:  Behavior normal.     UC  Treatments / Results  Labs (all labs ordered are listed, but only abnormal results are displayed) Labs Reviewed - No data to display  EKG   Radiology No results found.  Procedures Procedures (including critical care time)  Medications Ordered in UC Medications - No data to display  Initial Impression / Assessment and Plan / UC Course  I have reviewed the triage vital signs and the nursing notes.  Pertinent labs & imaging results that were available during my care of the patient were reviewed by me and considered in my medical decision making (see chart for details).     Discussed there aren't meds otherwise, outside the cephalosporin class, that tx both strep and urinary germs. So, rx for azithromycin done, and a rx for nitrofurantoin. Urine c/s pending. Final Clinical Impressions(s) / UC Diagnoses   Final diagnoses:  Strep pharyngitis  Cystitis     Discharge Instructions      Stop taking cephalexin.  Take azithromycin--250 mg--2 the first day, then 1 daily for 4 more days. This is for strep.(Tome 2 tabletas por la boca Software engineer dia, y entonces 1 diaria por 4 mas dias). Es for strep  Take nitrofurantoin 100 mg--1 capsule 2 times daily for 5 days for urinary infection(tome 1 capsula 2 veces al dia por 5 dias para la [place])     ED Prescriptions     Medication Sig Dispense Auth. Provider   azithromycin (ZITHROMAX) 250 MG tablet Take first 2 tablets together, then 1 every day until finished. 6 tablet Renatta Shrieves, Gwenlyn Perking, MD   nitrofurantoin, macrocrystal-monohydrate, (MACROBID) 100 MG capsule Take 1 capsule (100 mg total) by mouth 2 (two) times daily. 10 capsule Barrett Henle, MD      PDMP not reviewed this encounter.   Barrett Henle, MD 01/04/22 1956    Barrett Henle, MD 01/04/22 Despina Pole

## 2022-01-04 NOTE — Telephone Encounter (Signed)
  Chief Complaint: Short of breath Symptoms: Short of breath when speaking a lot Frequency: today Pertinent Negatives: Patient denies Rash swelling Disposition: '[x]'$ ED /'[]'$ Urgent Care (no appt availability in office) / '[]'$ Appointment(In office/virtual)/ '[]'$  [facility]/ '[]'$ Home Care/ '[]'$ Refused Recommended Disposition /'[]'$ [facility]/ '[]'$  Follow-up with PCP Additional Notes: Pt started cephalexin today. Took medication at 12:30 and is now having mild SOB. PT was seen last night at ED for UTI and given cephalexin. Pt will return to ED for evaluation and new antibiotic. Reason for Disposition  [1] MILD difficulty breathing (e.g., minimal/no SOB at rest, SOB with walking, pulse <100) AND [2] NEW-onset or WORSE than normal  Answer Assessment - Initial Assessment Questions 1. RESPIRATORY STATUS: "Describe your breathing?" (e.g., wheezing, shortness of breath, unable to speak, severe coughing)      Shorter sentences out of breath when speaking 2. ONSET: "When did this breathing problem begin?"      Took medication at 12:30 3. PATTERN "Does the difficult breathing come and go, or has it been constant since it started?"      constant 4. SEVERITY: "How bad is your breathing?" (e.g., mild, moderate, severe)    - MILD: No SOB at rest, mild SOB with walking, speaks normally in sentences, can lie down, no retractions, pulse < 100.    - MODERATE: SOB at rest, SOB with minimal exertion and prefers to sit, cannot lie down flat, speaks in phrases, mild retractions, audible wheezing, pulse 100-120.    - SEVERE: Very SOB at rest, speaks in single words, struggling to breathe, sitting hunched forward, retractions, pulse > 120      mild 5. RECURRENT SYMPTOM: "Have you had difficulty breathing before?" If Yes, ask: "When was the last time?" and "What happened that time?"      no 6. CARDIAC HISTORY: "Do you have any history of heart disease?" (e.g., heart attack, angina, bypass surgery, angioplasty)       no 7. LUNG HISTORY: "Do you have any history of lung disease?"  (e.g., pulmonary embolus, asthma, emphysema)     no 8. CAUSE: "What do you think is causing the breathing problem?"      New medication 9. OTHER SYMPTOMS: "Do you have any other symptoms? (e.g., dizziness, runny nose, cough, chest pain, fever)     no 10. O2 SATURATION MONITOR:  "Do you use an oxygen saturation monitor (pulse oximeter) at home?" If Yes, "What is your reading (oxygen level) today?" "What is your usual oxygen saturation reading?" (e.g., 95%)        11. PREGNANCY: "Is there any chance you are pregnant?" "When was your last menstrual period?"       na 12. TRAVEL: "Have you traveled out of the country in the last month?" (e.g., travel history, exposures)       na  Protocols used: Breathing Difficulty-A-AH

## 2022-01-05 LAB — URINE CULTURE: Culture: <10000 — AB

## 2022-01-08 ENCOUNTER — Encounter: Payer: Self-pay | Admitting: Internal Medicine

## 2022-01-23 ENCOUNTER — Other Ambulatory Visit: Payer: Self-pay

## 2022-01-27 ENCOUNTER — Other Ambulatory Visit (HOSPITAL_COMMUNITY)
Admission: RE | Admit: 2022-01-27 | Discharge: 2022-01-27 | Disposition: A | Discharge status: Home / Self Care | Payer: Self-pay | Source: Ambulatory Visit | Attending: Internal Medicine | Admitting: Internal Medicine

## 2022-01-27 ENCOUNTER — Other Ambulatory Visit: Payer: Self-pay

## 2022-01-27 ENCOUNTER — Ambulatory Visit: Payer: Self-pay | Attending: Internal Medicine | Admitting: Internal Medicine

## 2022-01-27 VITALS — BP 128/87 | HR 93 | Temp 98.7°F | Ht 65.0 in | Wt 202.8 lb

## 2022-01-27 DIAGNOSIS — N760 Acute vaginitis: Secondary | ICD-10-CM

## 2022-01-27 DIAGNOSIS — E1169 Type 2 diabetes mellitus with other specified complication: Secondary | ICD-10-CM

## 2022-01-27 DIAGNOSIS — E611 Iron deficiency: Secondary | ICD-10-CM

## 2022-01-27 DIAGNOSIS — Z1211 Encounter for screening for malignant neoplasm of colon: Secondary | ICD-10-CM

## 2022-01-27 DIAGNOSIS — R197 Diarrhea, unspecified: Secondary | ICD-10-CM

## 2022-01-27 DIAGNOSIS — Z6833 Body mass index (BMI) 33.0-33.9, adult: Secondary | ICD-10-CM

## 2022-01-27 DIAGNOSIS — J029 Acute pharyngitis, unspecified: Secondary | ICD-10-CM

## 2022-01-27 DIAGNOSIS — E669 Obesity, unspecified: Secondary | ICD-10-CM

## 2022-01-27 LAB — POCT RAPID STREP A (OFFICE): Rapid Strep A Screen: POSITIVE — AB

## 2022-01-27 LAB — POCT GLYCOSYLATED HEMOGLOBIN (HGB A1C): HbA1c, POC (controlled diabetic range): 8.4 % — AB (ref 0.0–7.0)

## 2022-01-27 LAB — GLUCOSE, POCT (MANUAL RESULT ENTRY): POC Glucose: 174 mg/dl — AB (ref 70–99)

## 2022-01-27 MED ORDER — FLUCONAZOLE 150 MG PO TABS
150.0000 mg | ORAL_TABLET | Freq: Every day | ORAL | 0 refills | Status: DC
Start: 2022-01-27 — End: 2022-02-24
  Filled 2022-01-27: qty 1, 1d supply, fill #0

## 2022-01-27 MED ORDER — LOPERAMIDE HCL 2 MG PO TABS
2.0000 mg | ORAL_TABLET | Freq: Two times a day (BID) | ORAL | 0 refills | Status: DC | PRN
  Filled 2022-01-27: qty 30, 15d supply, fill #0

## 2022-01-27 MED ORDER — AMOXICILLIN-POT CLAVULANATE 875-125 MG PO TABS
1.0000 | ORAL_TABLET | Freq: Two times a day (BID) | ORAL | 0 refills | Status: DC
  Filled 2022-01-27: qty 14, 7d supply, fill #0

## 2022-01-27 MED ORDER — EMPAGLIFLOZIN 10 MG PO TABS
10.0000 mg | ORAL_TABLET | Freq: Every day | ORAL | 2 refills | Status: DC
  Filled 2022-01-27: qty 30, 30d supply, fill #0
  Filled 2022-02-08: qty 14, 14d supply, fill #0
  Filled 2022-02-21 – 2022-02-24 (×2): qty 30, 30d supply, fill #0
  Filled 2022-03-15: qty 14, 14d supply, fill #0
  Filled 2022-03-29 – 2022-05-05 (×4): qty 30, 30d supply, fill #0

## 2022-01-27 NOTE — Progress Notes (Signed)
Vaginal discharge and itchiness Sore throat when eating Diarrhea.

## 2022-01-27 NOTE — Progress Notes (Signed)
Patient ID: Morgan Ramsey, female    DOB: October 28, 1975  MRN: [id number]  CC: Diabetes   Subjective: Morgan Ramsey is a 46 y.o. female who presents for CHRONIC DS MANAGEMENT Her concerns today include:  DM with microalbumin, obesity, HL, HPV positive (s/p negative colpo 11/2020)  C/o clear vaginal dischg and vaginal itching x 1 mth Sexually active with spouse only Also c/o sore throat for which she was seen in the emergency room 01/04/2022.  Diagnosed with strep throat.  Placed on Keflex.  Felt short of breath and tightness in the chest when she took the Keflex.  Subsequently seen at urgent care and this was changed to Zithromax.  She completed the antibiotics.  She states that she can now eat things but her throat still feels inflamed especially on the right side.  She has not had any fever.  Other complaint is of diarrhea that started after she took antibiotics for strep throat.  Whenever she has to urinate, she ends up having a bowel movement at the same time.  Wakes up about 3 times at nights with diarrhea.  Associated with crampy feeling in the abdomen.  No nausea or vomiting.  No blood in the stools.  No fever.  No recent travel outside of the [place].  She also finds that she has diarrhea about an hour after meals.  Not using anything for diarrhea  DM: Results for orders placed or performed in visit on 01/27/22  POCT glucose (manual entry)  Result Value Ref Range   POC Glucose 174 (A) 70 - 99 mg/dl  POCT glycosylated hemoglobin (Hb A1C)  Result Value Ref Range   Hemoglobin A1C     HbA1c POC (<> result, manual entry)     HbA1c, POC (prediabetic range)     HbA1c, POC (controlled diabetic range) 8.4 (A) 0.0 - 7.0 %  POCT rapid strep A  Result Value Ref Range   Rapid Strep A Screen Positive (A) Negative  Compliant with taking Amaryl 4 mg daily.  Checks blood sugars about every other day.  Reports readings have been good.  She does not have a log with her.  Anemic on blood  test done last visit.  Hemoglobin went from 12.9 to 10.3.  Iron studies were consistent with iron deficiency anemia.  Still gets menses that lasts 5 to 7 days with moderate bleeding.  I ordered fit test on last visit for colon cancer screening but patient states she never received it.  Patient Active Problem List   Diagnosis Date Noted   Pap smear of cervix shows high risk HPV present 11/15/2020   Influenza vaccine refused 07/16/2020   Hyperlipidemia associated with type 2 diabetes mellitus ([place]) 06/09/2020   Statins contraindicated 06/09/2020   Microalbuminuria due to type 2 diabetes mellitus ([place]) 06/09/2020   Type 2 diabetes mellitus with obesity ([place]) 06/04/2020   Polyhydramnios, antepartum complication 73/53/2992   Polyhydramnios in third trimester 08/13/2019   Unstable lie of fetus 08/06/2019   Obesity (BMI 30.0-34.9) 05/07/2019   Transient hypertension of pregnancy 05/07/2019   Language barrier 04/26/2015   H/O herpes simplex type 2 infection 04/26/2015   Previous cesarean section 04/26/2015   Obesity in pregnancy 04/26/2015   Anemia 04/26/2015     Current Outpatient Medications on File Prior to Visit  Medication Sig Dispense Refill   atorvastatin (LIPITOR) 10 MG tablet TAKE 1 TABLET (10 MG TOTAL) BY MOUTH DAILY. 90 tablet 1   ferrous sulfate 325 (65 FE) MG tablet Take  1 tablet (325 mg total) by mouth daily with breakfast. 100 tablet 1   glimepiride (AMARYL) 4 MG tablet Take 1 tablet (4 mg total) by mouth daily with breakfast. 90 tablet 1   glucose blood (TRUE METRIX BLOOD GLUCOSE TEST) test strip Use to check blood sugar up to 3 times daily. 100 each 2   TRUEplus Lancets 28G MISC Use to check blood sugar up to 3 times daily. 100 each 2   [DISCONTINUED] metFORMIN (GLUCOPHAGE) 1000 MG tablet Take 1 tablet (1,000 mg total) by mouth 2 (two) times daily with a meal. 60 tablet 6   No current facility-administered medications on file prior to visit.    Allergies  Allergen  Reactions   Metformin And Related     Diarrhea    Social History   Socioeconomic History   Marital status: Married    Spouse name: Radio producer   Number of children: 4   Years of education: Not on file   Highest education level: 6th grade  Occupational History    Comment: unemployed  Tobacco Use   Smoking status: Never   Smokeless tobacco: Never  Vaping Use   Vaping Use: Never used  Substance and Sexual Activity   Alcohol use: No   Drug use: No   Sexual activity: Not Currently    Birth control/protection: Condom  Other Topics Concern   Not on file  Social History Narrative   Not on file   Social Determinants of Health   Financial Resource Strain: Not on file  Food Insecurity: Food Insecurity Present (03/11/2019)   Hunger Vital Sign    Worried About Running Out of Food in the Last Year: Sometimes true    Ran Out of Food in the Last Year: Sometimes true  Transportation Needs: Unmet Transportation Needs (03/11/2019)   PRAPARE - Hydrologist (Medical): Yes    Lack of Transportation (Non-Medical): Yes  Physical Activity: Not on file  Stress: Not on file  Social Connections: Not on file  Intimate Partner Violence: Not At Risk (03/11/2019)   Humiliation, Afraid, Rape, and Kick questionnaire    Fear of Current or Ex-Partner: No    Emotionally Abused: No    Physically Abused: No    Sexually Abused: No    Family History  Problem Relation Age of Onset   Diabetes Mother        oral agents   Hypertension Mother    Hypertension Father    Hypertension Brother    Breast cancer Cousin    Anesthesia problems Neg Hx    Hypotension Neg Hx    Malignant hyperthermia Neg Hx    Pseudochol deficiency Neg Hx     Past Surgical History:  Procedure Laterality Date   CESAREAN SECTION  05/21/2011   Procedure: CESAREAN SECTION;  Surgeon: Jonnie Kind, MD;  Location: [facility];  Service: Gynecology;  Laterality: N/A;  Primary cesarean section with  delivery of baby girl at 61. Apgars 9/9.   HERPES SIMPLEX VIRUS DFA     last outbreak prior to 2017    ROS: Review of Systems Negative except as stated above  PHYSICAL EXAM: BP 128/87   Pulse 93   Temp 98.7 F (37.1 C) (Oral)   Ht '5\' 5"'$  (1.651 m)   Wt 202 lb 12.8 oz (92 kg)   SpO2 100%   BMI 33.75 kg/m   Physical Exam  General appearance - alert, well appearing, and in no distress Mental status -  normal mood, behavior, speech, dress, motor activity, and thought processes Neck -no cervical lymphadenopathy.  No swelling of the joint noted. Mouth: Tonsils enlarged but not kissing no obstruction.  No exudates seen.  Mild erythema. Chest - clear to auscultation, no wheezes, rales or rhonchi, symmetric air entry Heart - normal rate, regular rhythm, normal S1, S2, no murmurs, rubs, clicks or gallops Extremities - peripheral pulses normal, no pedal edema, no clubbing or cyanosis  Positive strep test point-of-care today.     Latest Ref Rng & Units 09/27/2021   10:47 AM 06/07/2020    9:40 AM 09/25/2019    9:22 PM  CMP  Glucose 70 - 99 mg/dL 140  284  224   BUN 6 - 24 mg/dL '9  10  12   '$ Creatinine 0.57 - 1.00 mg/dL 0.70  0.61  0.83   Sodium 134 - 144 mmol/L 139  136  137   Potassium 3.5 - 5.2 mmol/L 4.3  3.8  3.5   Chloride 96 - 106 mmol/L 103  97  103   CO2 20 - 29 mmol/L '24  23  25   '$ Calcium 8.7 - 10.2 mg/dL 9.1  8.9  8.9   Total Protein 6.0 - 8.5 g/dL 7.8  7.6  6.8   Total Bilirubin 0.0 - 1.2 mg/dL 0.2  0.3  0.2   Alkaline Phos 44 - 121 IU/L 129  143  103   AST 0 - 40 IU/L '17  14  29   '$ ALT 0 - 32 IU/L 17  18  35    Lipid Panel     Component Value Date/Time   CHOL 169 09/27/2021 1047   TRIG 119 09/27/2021 1047   HDL 58 09/27/2021 1047   CHOLHDL 2.9 09/27/2021 1047   LDLCALC 90 09/27/2021 1047    CBC    Component Value Date/Time   WBC 7.0 09/27/2021 1047   WBC 7.9 09/25/2019 2122   RBC 5.02 09/27/2021 1047   RBC 4.81 09/25/2019 2122   HGB 10.3 (L) 09/27/2021  1047   HGB 9.3 04/02/2015 0000   HCT 34.1 09/27/2021 1047   HCT 32 04/02/2015 0000   PLT 398 09/27/2021 1047   PLT 334 04/02/2015 0000   MCV 68 (L) 09/27/2021 1047   MCH 20.5 (L) 09/27/2021 1047   MCH 23.5 (L) 09/25/2019 2122   MCHC 30.2 (L) 09/27/2021 1047   MCHC 31.1 09/25/2019 2122   RDW 15.2 09/27/2021 1047   LYMPHSABS 1.3 03/13/2019 1206   MONOABS 0.9 07/14/2013 0228   EOSABS 0.1 03/13/2019 1206   BASOSABS 0.0 03/13/2019 1206   Lab Results  Component Value Date   IRON 19 (L) 09/27/2021   TIBC 485 (H) 09/27/2021   FERRITIN 6 (L) 09/27/2021   Results for orders placed or performed in visit on 01/27/22  POCT glucose (manual entry)  Result Value Ref Range   POC Glucose 174 (A) 70 - 99 mg/dl  POCT glycosylated hemoglobin (Hb A1C)  Result Value Ref Range   Hemoglobin A1C     HbA1c POC (<> result, manual entry)     HbA1c, POC (prediabetic range)     HbA1c, POC (controlled diabetic range) 8.4 (A) 0.0 - 7.0 %  POCT rapid strep A  Result Value Ref Range   Rapid Strep A Screen Positive (A) Negative    ASSESSMENT AND PLAN: 1. Type 2 diabetes mellitus with obesity (HCC) Not at goal.  Continue Amaryl.  Discussed putting her back on low-dose of long-acting  insulin versus adding another oral medication.  Patient prefers oral.  Did not tolerate metformin in the past due to diarrhea.  We will add Jardiance.  Advised patient to hold off on starting the Jardiance until the diarrhea resolves.  I wrote down the name of this medication for her so that she knows to hold this medicine until the diarrhea resolves.  She expressed understanding. - POCT glucose (manual entry) - POCT glycosylated hemoglobin (Hb A1C) - empagliflozin (JARDIANCE) 10 MG TABS tablet; Take 1 tablet (10 mg total) by mouth daily before breakfast.  Dispense: 30 tablet; Refill: 2  2. Acute vaginitis We will treat empirically for yeast. - fluconazole (DIFLUCAN) 150 MG tablet; Take 1 tablet (150 mg total) by mouth daily.   Dispense: 1 tablet; Refill: 0 - Cervicovaginal ancillary only  3. Sore throat Persistent Strep infection We will treat with Augmentin.  She reports no allergies to penicillin. Advised patient that if she develops any significant pain in the throat, swelling of the throat or neck or problems handling secretions/saliva, he should be seen in the emergency room as this would be indicative of a peritonsillar abscess.  She expressed understanding. - amoxicillin-clavulanate (AUGMENTIN) 875-125 MG tablet; Take 1 tablet by mouth 2 (two) times daily for 7 days.  Dispense: 14 tablet; Refill: 0 - POCT rapid strep A  4. Diarrhea, unspecified type We will screen for C. difficile and get a stool culture. Prescribe some Imodium to use as needed. Advised to hold off on using dairy products for a few weeks.  Avoid artificial sweeteners. - Clostridium Difficile by PCR([facility] only ) - Stool culture - Basic Metabolic Panel - loperamide (IMODIUM A-D) 2 MG tablet; Take 1 tablet (2 mg total) by mouth 2 (two) times daily as needed for diarrhea or loose stools.  Dispense: 30 tablet; Refill: 0  5. Iron deficiency Likely due to menses.  Continue iron supplement.  Recheck CBC today to see if any improvement. - CBC  6. Screening for colon cancer - Fecal occult blood, imunochemical([facility])    [employer] Language interpreter used during this encounter. #Janeth [id number] Silversmith [id number], [id number]  Patient was given the opportunity to ask questions.  Patient verbalized understanding of the plan and was able to repeat key elements of the plan.   This documentation was completed using Radio producer.  Any transcriptional errors are unintentional.  Orders Placed This Encounter  Procedures   Clostridium Difficile by PCR([facility] only )   Stool culture   Fecal occult blood, imunochemical([facility])   Basic Metabolic Panel   CBC   POCT glucose (manual entry)   POCT glycosylated  hemoglobin (Hb A1C)   POCT rapid strep A     Requested Prescriptions   Signed Prescriptions Disp Refills   fluconazole (DIFLUCAN) 150 MG tablet 1 tablet 0    Sig: Take 1 tablet (150 mg total) by mouth daily.   empagliflozin (JARDIANCE) 10 MG TABS tablet 30 tablet 2    Sig: Take 1 tablet (10 mg total) by mouth daily before breakfast.   loperamide (IMODIUM A-D) 2 MG tablet 30 tablet 0    Sig: Take 1 tablet (2 mg total) by mouth 2 (two) times daily as needed for diarrhea or loose stools.   amoxicillin-clavulanate (AUGMENTIN) 875-125 MG tablet 14 tablet 0    Sig: Take 1 tablet by mouth 2 (two) times daily for 7 days.    Return in about 4 months (around 05/29/2022).  Karle Plumber, MD, FACP

## 2022-01-28 LAB — CBC
Hematocrit: 38.8 % (ref 34.0–46.6)
Hemoglobin: 12.1 g/dL (ref 11.1–15.9)
MCH: 23.1 pg — ABNORMAL LOW (ref 26.6–33.0)
MCHC: 31.2 g/dL — ABNORMAL LOW (ref 31.5–35.7)
MCV: 74 fL — ABNORMAL LOW (ref 79–97)
Platelets: 331 10*3/uL (ref 150–450)
RBC: 5.23 x10E6/uL (ref 3.77–5.28)
RDW: 19.2 % — ABNORMAL HIGH (ref 11.7–15.4)
WBC: 10 10*3/uL (ref 3.4–10.8)

## 2022-01-28 LAB — BASIC METABOLIC PANEL
BUN/Creatinine Ratio: 12 (ref 9–23)
BUN: 7 mg/dL (ref 6–24)
CO2: 23 mmol/L (ref 20–29)
Calcium: 9 mg/dL (ref 8.7–10.2)
Chloride: 100 mmol/L (ref 96–106)
Creatinine, Ser: 0.59 mg/dL (ref 0.57–1.00)
Glucose: 125 mg/dL — ABNORMAL HIGH (ref 70–99)
Potassium: 3.9 mmol/L (ref 3.5–5.2)
Sodium: 136 mmol/L (ref 134–144)
eGFR: 112 mL/min/{1.73_m2} (ref >59)

## 2022-01-29 LAB — CERVICOVAGINAL ANCILLARY ONLY
Bacterial Vaginitis (gardnerella): NEGATIVE
Candida Glabrata: NEGATIVE
Candida Vaginitis: POSITIVE — AB
Chlamydia: NEGATIVE
Comment: NEGATIVE
Comment: NEGATIVE
Comment: NEGATIVE
Comment: NEGATIVE
Comment: NEGATIVE
Comment: NORMAL
Neisseria Gonorrhea: NEGATIVE
Trichomonas: NEGATIVE

## 2022-01-30 ENCOUNTER — Other Ambulatory Visit: Payer: Self-pay

## 2022-02-02 LAB — CLOSTRIDIUM DIFFICILE BY PCR: Toxigenic C. Difficile by PCR: NEGATIVE

## 2022-02-04 LAB — STOOL CULTURE: E coli, Shiga toxin Assay: NEGATIVE

## 2022-02-04 LAB — FECAL OCCULT BLOOD, IMMUNOCHEMICAL: Fecal Occult Bld: POSITIVE — AB

## 2022-02-05 ENCOUNTER — Other Ambulatory Visit: Payer: Self-pay | Admitting: Internal Medicine

## 2022-02-05 DIAGNOSIS — R197 Diarrhea, unspecified: Secondary | ICD-10-CM

## 2022-02-05 DIAGNOSIS — R195 Other fecal abnormalities: Secondary | ICD-10-CM

## 2022-02-06 ENCOUNTER — Telehealth: Payer: Self-pay | Admitting: *Deleted

## 2022-02-06 NOTE — Telephone Encounter (Signed)
Copied from [place] [phone]. Topic: General - Other >> Feb 03, 2022  4:44 PM Everette C wrote: Reason for CRM: The patient would like to speak with a member of clinical staff about their recent lab results   Please contact further when possible

## 2022-02-08 ENCOUNTER — Other Ambulatory Visit: Payer: Self-pay

## 2022-02-15 ENCOUNTER — Other Ambulatory Visit: Payer: Self-pay

## 2022-02-21 ENCOUNTER — Other Ambulatory Visit: Payer: Self-pay

## 2022-02-24 ENCOUNTER — Other Ambulatory Visit: Payer: Self-pay

## 2022-02-24 ENCOUNTER — Other Ambulatory Visit: Payer: Self-pay | Admitting: Internal Medicine

## 2022-02-24 DIAGNOSIS — N760 Acute vaginitis: Secondary | ICD-10-CM

## 2022-02-24 MED ORDER — FLUCONAZOLE 150 MG PO TABS
150.0000 mg | ORAL_TABLET | Freq: Every day | ORAL | 0 refills | Status: DC
  Filled 2022-02-24 – 2022-03-29 (×3): qty 1, 1d supply, fill #0

## 2022-02-24 NOTE — Telephone Encounter (Signed)
Requested medication (s) are due for refill today: no  Requested medication (s) are on the active medication list: yes  Last refill:  01/27/22 1 tab  Future visit scheduled: no  Notes to clinic:  pt requesting refill. Please advise. Unable to refill per protocol, medication not assigned to the refill protocol.     Requested Prescriptions  Pending Prescriptions Disp Refills   fluconazole (DIFLUCAN) 150 MG tablet 1 tablet 0    Sig: Take 1 tablet (150 mg total) by mouth daily.     Off-Protocol Failed - 02/24/2022 10:04 AM      Failed - Medication not assigned to a protocol, review manually.      Passed - Valid encounter within last 12 months    Recent Outpatient Visits           4 weeks ago Type 2 diabetes mellitus with obesity ([place])   [place] [facility]le Plumber B, MD   5 months ago Type 2 diabetes mellitus with obesity [facility])   [facility] Ladell Pier, MD   5 months ago Appointment canceled by hospital   [facility] Ladell Pier, MD   1 year ago Encounter for annual routine gynecological examination   [place] Ladell Pier, MD   1 year ago Type 2 diabetes mellitus with obesity [facility])   [facility] Ladell Pier, MD

## 2022-02-27 ENCOUNTER — Other Ambulatory Visit: Payer: Self-pay

## 2022-03-06 ENCOUNTER — Other Ambulatory Visit: Payer: Self-pay

## 2022-03-09 ENCOUNTER — Ambulatory Visit
Admission: RE | Admit: 2022-03-09 | Discharge: 2022-03-09 | Disposition: A | Discharge status: Home / Self Care | Payer: No Typology Code available for payment source | Source: Ambulatory Visit | Attending: Obstetrics and Gynecology | Admitting: Obstetrics and Gynecology

## 2022-03-09 ENCOUNTER — Ambulatory Visit: Payer: Self-pay | Admitting: *Deleted

## 2022-03-09 ENCOUNTER — Other Ambulatory Visit: Payer: Self-pay

## 2022-03-09 VITALS — BP 110/74 | Wt 204.4 lb

## 2022-03-09 DIAGNOSIS — Z1231 Encounter for screening mammogram for malignant neoplasm of breast: Secondary | ICD-10-CM

## 2022-03-09 DIAGNOSIS — Z01419 Encounter for gynecological examination (general) (routine) without abnormal findings: Secondary | ICD-10-CM

## 2022-03-09 NOTE — Patient Instructions (Signed)
Explained breast self awareness with Morgan Ramsey. Pap smear completed. Let her know her next Pap smear will be due based on the result of today's Pap smear due to her history of positive HPV. Referred patient to the [facility] for a screening mammogram on mobile unit. Appointment scheduled Thursday, March 09, 2022 at 1020. Patient aware of appointment and will be there. Let patient know will follow up with her within the next couple weeks with results of her Pap smear by phone. Informed patient that the [facility] will follow up with her within the next couple of weeks with results of her mammogram by letter or phone. Morgan Ramsey verbalized understanding.  Georgianne Gritz, Arvil Chaco, RN 9:52 AM

## 2022-03-09 NOTE — Progress Notes (Signed)
[name] is a 46 y.o. [id number] female who presents to [facility] clinic today with no complaints.    Pap Smear: Pap smear completed today. Last Pap smear was 12/31/2020 at the [facility] Department [facility]) clinic and was normal with positive HPV. Patient has history of two other normal Pap smears with positive HPV 09/14/2020 and 04/02/2019 that were positive for HPV 16. Patient had a colposcopy to follow up after the second one 11/15/2020 that was benign. Patient has no history of abnormal Pap smear. Last Pap smear result is not available in Epic. Last Pap smear result will be scanned in Epic. Patients previous two Pap smears and colposcopy results are available in Epic.   Physical exam: Breasts Breasts symmetrical. No skin abnormalities bilateral breasts. No nipple retraction bilateral breasts. No nipple discharge bilateral breasts. No lymphadenopathy. No lumps palpated bilateral breasts. No complaints of pain or tenderness on exam.     MS DIGITAL SCREENING TOMO BILATERAL  Result Date: 10/25/2020 CLINICAL DATA:  Screening. EXAM: DIGITAL SCREENING BILATERAL MAMMOGRAM WITH TOMOSYNTHESIS AND CAD TECHNIQUE: Bilateral screening digital craniocaudal and mediolateral oblique mammograms were obtained. Bilateral screening digital breast tomosynthesis was performed. The images were evaluated with computer-aided detection. COMPARISON:  None. ACR Breast Density Category b: There are scattered areas of fibroglandular density. FINDINGS: There are no findings suspicious for malignancy. The images were evaluated with computer-aided detection. IMPRESSION: No mammographic evidence of malignancy. A result letter of this screening mammogram will be mailed directly to the patient. RECOMMENDATION: Screening mammogram in one year. (Code:SM-B-01Y) BI-RADS CATEGORY  1: Negative. Electronically Signed   By: Morgan Ramsey M.D   On: 10/25/2020 08:34    Pelvic/Bimanual Ext Genitalia No lesions, no swelling  and no discharge observed on external genitalia.        Vagina Vagina pink and normal texture. No lesions or discharge observed in vagina.        Cervix Cervix is present. Cervix pink and of normal texture. Blood observed at os that per patient her menstrual cycle is due. IUD strings visualized. No discharge observed.    Uterus Uterus is present and palpable. Uterus in normal position and normal size.        Adnexae Bilateral ovaries present and palpable. No tenderness on palpation.         Rectovaginal No rectal exam completed today since patient had no rectal complaints. No skin abnormalities observed on exam.     Smoking History: Patient has never smoked.   Patient Navigation: Patient education provided. Access to services provided for patient through Morgan Ramsey program. Spanish interpreter Morgan Ramsey from [facility] provided.   Colorectal Cancer Screening: Per patient has never had colonoscopy completed. Patient completed a FIT Test 01/30/2022 that was positive. Patient's PCP is referring to GI for a colonoscopy. No complaints today.    Breast and Cervical Cancer Risk Assessment: Patient has family history of her first maternal cousin having breast cancer. Patient has no known genetic mutations or history of radiation treatment to the chest before age 35. Patient does not have history of cervical dysplasia, immunocompromised, or DES exposure in-utero.  Risk Assessment     Risk Scores       03/09/2022 10/21/2020   Last edited by: Morgan Revel, LPN [place], Morgan Ramsey Mamie Nick, LPN   5-year risk: 0.7 % 0.6 %   Lifetime risk: 7.3 % 7.5 %            A: B[facility]CP exam with pap smear No complaints.  P: Referred patient to the [place] for a screening mammogram on mobile unit. Appointment scheduled Thursday, March 09, 2022 at 1020.  Morgan Parish, RN 03/09/2022 9:52 AM

## 2022-03-14 LAB — CYTOLOGY - PAP
Comment: NEGATIVE
Diagnosis: NEGATIVE
High risk HPV: NEGATIVE

## 2022-03-15 ENCOUNTER — Telehealth: Payer: Self-pay

## 2022-03-15 NOTE — Telephone Encounter (Signed)
Called patient via Morgan Ramsey, [employer] to give pap smear results. Informed patient that pap smear was normal and HPV was negative. Based on this result and her previous hx her next pap smear will be due in 1 year. Patient voiced understanding.

## 2022-03-16 ENCOUNTER — Other Ambulatory Visit (HOSPITAL_COMMUNITY): Payer: Self-pay

## 2022-03-24 ENCOUNTER — Other Ambulatory Visit (HOSPITAL_COMMUNITY): Payer: Self-pay

## 2022-03-30 ENCOUNTER — Other Ambulatory Visit: Payer: Self-pay

## 2022-03-31 ENCOUNTER — Other Ambulatory Visit: Payer: Self-pay

## 2022-04-03 ENCOUNTER — Other Ambulatory Visit: Payer: Self-pay

## 2022-04-07 ENCOUNTER — Other Ambulatory Visit: Payer: Self-pay

## 2022-04-07 ENCOUNTER — Telehealth: Payer: Self-pay | Admitting: Internal Medicine

## 2022-04-07 ENCOUNTER — Encounter: Payer: Self-pay | Admitting: Gastroenterology

## 2022-04-07 ENCOUNTER — Ambulatory Visit: Payer: Self-pay | Admitting: *Deleted

## 2022-04-07 NOTE — Telephone Encounter (Signed)
Summary: Medication Advice   Pt would like to know should she still be taking this medication?Rx #: [id number]  empagliflozin (JARDIANCE) 10 MG TABS tablet [[id number]]      Called patient via interpreter Gerrit Friends ID #[id number] to review medication questions. No answer, LVMTCB [phone].

## 2022-04-07 NOTE — Telephone Encounter (Signed)
Pt is calling to request results and patient is wanting results from Positive FIT (fecal immunochemical test)  - Primary. Pt is calling with the assistance of [place]. Please advise

## 2022-04-07 NOTE — Telephone Encounter (Signed)
  Chief Complaint: Morgan Ramsey if pt should be taking this medication Symptoms:  Frequency: 01/27/2022 Pertinent Negatives: Patient denies  Disposition: '[]'$ ED /'[]'$ Urgent Care (no appt availability in office) / '[]'$ Appointment(In office/virtual)/ '[]'$  [place] [facility]/ '[]'$ Home Care/ '[]'$ Refused Recommended Disposition /'[]'$ [facility]/ '[x]'$  Follow-up with PCP Additional Notes: Pt called. She is unsure if she should be taking this medication. Per pt she went to p/u this medication from pharmacy and they said it was not available. Pt would like call back to verify if medicine should be taken, and if yes please send Rx into [facility] [employer].   Summary: Medication Advice   Pt would like to know should she still be taking this medication?Rx #: [id number]  empagliflozin (JARDIANCE) 10 MG TABS tablet [[id number]]      Reason for Disposition  [1] Caller has URGENT medicine question about med that PCP or specialist prescribed AND [2] triager unable to answer question  Answer Assessment - Initial Assessment Questions 1. NAME of MEDICINE: "What medicine(s) are you calling about?"     Jardiance 2. QUESTION: "What is your question?" (e.g., double dose of medicine, side effect)     Is pt supposed to be taking this medication? 3. PRESCRIBER: "Who prescribed the medicine?" Reason: if prescribed by specialist, call should be referred to that group.     Dr. Wynetta Emery 4. SYMPTOMS: "Do you have any symptoms?" If Yes, ask: "What symptoms are you having?"  "How bad are the symptoms (e.g., mild, moderate, severe)      5. PREGNANCY:  "Is there any chance that you are pregnant?" "When was your last menstrual period?"  Protocols used: Medication Question Call-A-AH

## 2022-04-10 ENCOUNTER — Other Ambulatory Visit: Payer: Self-pay

## 2022-04-12 NOTE — Telephone Encounter (Signed)
LVM for pt to RTC.-------DD,RMA

## 2022-04-14 NOTE — Telephone Encounter (Signed)
RTC spoke w/ pt she was requesting Pap results. Pt was informed of pap results/ notes per Dr. Margarita Rana. Pt expressed understanding.---DD,RMA

## 2022-04-19 ENCOUNTER — Ambulatory Visit (AMBULATORY_SURGERY_CENTER): Payer: Self-pay | Admitting: *Deleted

## 2022-04-19 VITALS — Ht 65.0 in | Wt 207.0 lb

## 2022-04-19 DIAGNOSIS — Z1211 Encounter for screening for malignant neoplasm of colon: Secondary | ICD-10-CM

## 2022-04-19 NOTE — Telephone Encounter (Signed)
Please follow up with patient.

## 2022-04-19 NOTE — Progress Notes (Signed)
No egg or soy allergy known to patient  No issues known to pt with past sedation with any surgeries or procedures Patient denies ever being told they had issues or difficulty with intubation  No FH of Malignant Hyperthermia Pt is not on diet pills Pt is not on  home 02  Pt is not on blood thinners  Pt denies issues with constipation  No A fib or A flutter Have any cardiac testing pending--no Pt instructed to use Singlecare.com or GoodRx for a price reduction on prep     Sample sheet of over the counter items to purchase for prep given to pt.  Interpreter present for pre-visit.

## 2022-04-19 NOTE — Telephone Encounter (Signed)
Pt informed of note per providers. Pt expressed understanding. ----DD,RMA

## 2022-05-04 ENCOUNTER — Other Ambulatory Visit: Payer: Self-pay | Admitting: Internal Medicine

## 2022-05-04 DIAGNOSIS — E669 Obesity, unspecified: Secondary | ICD-10-CM

## 2022-05-05 ENCOUNTER — Other Ambulatory Visit: Payer: Self-pay

## 2022-05-05 MED ORDER — GLIMEPIRIDE 4 MG PO TABS
4.0000 mg | ORAL_TABLET | Freq: Every day | ORAL | 0 refills | Status: DC
  Filled 2022-05-05: qty 30, 30d supply, fill #0

## 2022-05-05 NOTE — Telephone Encounter (Signed)
Requested Prescriptions  Pending Prescriptions Disp Refills  . glimepiride (AMARYL) 4 MG tablet 90 tablet 0    Sig: Take 1 tablet (4 mg total) by mouth daily with breakfast.     Endocrinology:  Diabetes - Sulfonylureas Failed - 05/04/2022  5:31 PM      Failed - HBA1C is between 0 and 7.9 and within 180 days    HbA1c, POC (controlled diabetic range)  Date Value Ref Range Status  01/27/2022 8.4 (A) 0.0 - 7.0 % Final         Passed - Cr in normal range and within 360 days    Creat  Date Value Ref Range Status  04/29/2015 0.51 0.50 - 1.10 mg/dL Final   Creatinine, Ser  Date Value Ref Range Status  01/27/2022 0.59 0.57 - 1.00 mg/dL Final         Passed - Valid encounter within last 6 months    Recent Outpatient Visits          3 months ago Type 2 diabetes mellitus with obesity ([place])   [place] Karle Plumber B, MD   7 months ago Type 2 diabetes mellitus with obesity [facility])   [place] Ladell Pier, MD   7 months ago Appointment canceled by hospital   [place], Deborah B, MD   1 year ago Encounter for annual routine gynecological examination   [place], Deborah B, MD   1 year ago Type 2 diabetes mellitus with obesity [facility])   [place], MD      Future Appointments            In 1 week Thereasa Solo, Dionne Bucy, PA-C [place]

## 2022-05-08 ENCOUNTER — Encounter: Payer: Self-pay | Admitting: Gastroenterology

## 2022-05-08 ENCOUNTER — Ambulatory Visit (AMBULATORY_SURGERY_CENTER): Payer: Self-pay | Admitting: Gastroenterology

## 2022-05-08 VITALS — BP 131/77 | HR 82 | Temp 98.6°F | Resp 11 | Ht 65.0 in | Wt 207.0 lb

## 2022-05-08 DIAGNOSIS — K640 First degree hemorrhoids: Secondary | ICD-10-CM

## 2022-05-08 DIAGNOSIS — K5140 Inflammatory polyps of colon without complications: Secondary | ICD-10-CM

## 2022-05-08 DIAGNOSIS — Z1211 Encounter for screening for malignant neoplasm of colon: Secondary | ICD-10-CM

## 2022-05-08 DIAGNOSIS — R195 Other fecal abnormalities: Secondary | ICD-10-CM

## 2022-05-08 DIAGNOSIS — D122 Benign neoplasm of ascending colon: Secondary | ICD-10-CM

## 2022-05-08 DIAGNOSIS — D124 Benign neoplasm of descending colon: Secondary | ICD-10-CM

## 2022-05-08 DIAGNOSIS — K644 Residual hemorrhoidal skin tags: Secondary | ICD-10-CM

## 2022-05-08 MED ORDER — SODIUM CHLORIDE 0.9 % IV SOLN: 500.0000 mL | Freq: Once | INTRAVENOUS | Status: DC

## 2022-05-08 NOTE — Progress Notes (Signed)
Interpreter used today at the [facility] for this pt.  Interpreter's name is-Timothy Platt 

## 2022-05-08 NOTE — Progress Notes (Signed)
[facility] History and Physical   Primary Care Physician:  Ladell Pier, MD   Reason for Procedure:   Positive FIT test  Plan:    Colonoscopy     HPI: Morgan Ramsey is a 46 y.o. female undergoing initial colonoscopy after a positive FIT test.  She has no family history of colon cancer and no chronic GI symptoms.    Past Medical History:  Diagnosis Date   Allergy    pollen   AMA (advanced maternal age) multigravida 35+    Anemia    Gestational diabetes 2017   glyburide   Herpes    History of candidal vulvovaginitis    History of chlamydia 2008   Hyperlipidemia    Obese     Past Surgical History:  Procedure Laterality Date   CESAREAN SECTION  05/21/2011   Procedure: CESAREAN SECTION;  Surgeon: Jonnie Kind, MD;  Location: [facility];  Service: Gynecology;  Laterality: N/A;  Primary cesarean section with delivery of baby girl at 35. Apgars 9/9.   HERPES SIMPLEX VIRUS DFA     last outbreak prior to 2017    Prior to Admission medications   Medication Sig Start Date End Date Taking? Authorizing Provider  atorvastatin (LIPITOR) 10 MG tablet TAKE 1 TABLET (10 MG TOTAL) BY MOUTH DAILY. 09/27/21   Ladell Pier, MD  empagliflozin (JARDIANCE) 10 MG TABS tablet Take 1 tablet by mouth daily before breakfast. 01/27/22   Ladell Pier, MD  ferrous sulfate 325 (65 FE) MG tablet Take 1 tablet (325 mg total) by mouth daily with breakfast. Patient not taking: Reported on 04/19/2022 09/29/21   Ladell Pier, MD  glimepiride (AMARYL) 4 MG tablet Take 1 tablet (4 mg total) by mouth daily with breakfast. 05/05/22   Ladell Pier, MD  glucose blood (TRUE METRIX BLOOD GLUCOSE TEST) test strip Use to check blood sugar up to 3 times daily. 10/27/21   Ladell Pier, MD  loperamide (IMODIUM A-D) 2 MG tablet Take 1 tablet (2 mg total) by mouth 2 (two) times daily as needed for diarrhea or loose stools. Patient not taking: Reported on 04/19/2022 01/27/22    Ladell Pier, MD  TRUEplus Lancets 28G MISC Use to check blood sugar up to 3 times daily. 10/27/21   Ladell Pier, MD  metFORMIN (GLUCOPHAGE) 1000 MG tablet Take 1 tablet (1,000 mg total) by mouth 2 (two) times daily with a meal. 07/16/20 09/14/20  Ladell Pier, MD    Current Outpatient Medications  Medication Sig Dispense Refill   atorvastatin (LIPITOR) 10 MG tablet TAKE 1 TABLET (10 MG TOTAL) BY MOUTH DAILY. 90 tablet 1   empagliflozin (JARDIANCE) 10 MG TABS tablet Take 1 tablet by mouth daily before breakfast. 30 tablet 2   ferrous sulfate 325 (65 FE) MG tablet Take 1 tablet (325 mg total) by mouth daily with breakfast. (Patient not taking: Reported on 04/19/2022) 100 tablet 1   glimepiride (AMARYL) 4 MG tablet Take 1 tablet (4 mg total) by mouth daily with breakfast. 90 tablet 0   glucose blood (TRUE METRIX BLOOD GLUCOSE TEST) test strip Use to check blood sugar up to 3 times daily. 100 each 2   loperamide (IMODIUM A-D) 2 MG tablet Take 1 tablet (2 mg total) by mouth 2 (two) times daily as needed for diarrhea or loose stools. (Patient not taking: Reported on 04/19/2022) 30 tablet 0   TRUEplus Lancets 28G MISC Use to check blood sugar up to 3  times daily. 100 each 2   Current Facility-Administered Medications  Medication Dose Route Frequency Provider Last Rate Last Admin   0.9 %  sodium chloride infusion  500 mL Intravenous Once Daryel November, MD        Allergies as of 05/08/2022 - Review Complete 05/08/2022  Allergen Reaction Noted   Keflex [cephalexin]  01/27/2022   Metformin and related  09/14/2020    Family History  Problem Relation Age of Onset   Diabetes Mother        oral agents   Hypertension Mother    Hypertension Father    Hypertension Brother    Breast cancer Cousin    Anesthesia problems Neg Hx    Hypotension Neg Hx    Malignant hyperthermia Neg Hx    Pseudochol deficiency Neg Hx    Colon cancer Neg Hx    Colon polyps Neg Hx    Crohn's disease  Neg Hx    Esophageal cancer Neg Hx    Stomach cancer Neg Hx    Rectal cancer Neg Hx    Ulcerative colitis Neg Hx     Social History   Socioeconomic History   Marital status: Married    Spouse name: Morgan Ramsey   Number of children: 4   Years of education: Not on file   Highest education level: 6th grade  Occupational History    Comment: unemployed  Tobacco Use   Smoking status: Never    Passive exposure: Past (co-wokers 10 years ago)   Smokeless tobacco: Never  Vaping Use   Vaping Use: Never used  Substance and Sexual Activity   Alcohol use: No   Drug use: Never   Sexual activity: Yes    Birth control/protection: Condom, I.U.D.  Other Topics Concern   Not on file  Social History Narrative   Not on file   Social Determinants of Health   Financial Resource Strain: Not on file  Food Insecurity: No Food Insecurity (03/09/2022)   Hunger Vital Sign    Worried About Running Out of Food in the Last Year: Never true    Ran Out of Food in the Last Year: Never true  Transportation Needs: No Transportation Needs (03/09/2022)   PRAPARE - Hydrologist (Medical): No    Lack of Transportation (Non-Medical): No  Physical Activity: Not on file  Stress: Not on file  Social Connections: Not on file  Intimate Partner Violence: Not At Risk (03/11/2019)   Humiliation, Afraid, Rape, and Kick questionnaire    Fear of Current or Ex-Partner: No    Emotionally Abused: No    Physically Abused: No    Sexually Abused: No    Review of Systems:  All other review of systems negative except as mentioned in the HPI.  Physical Exam: Vital signs BP (!) 148/71   Pulse 78   Temp 98.6 F (37 C) (Other (Comment)) Comment (Src): forehead  Ht '5\' 5"'$  (1.651 m)   Wt 207 lb (93.9 kg)   LMP 05/08/2022 (Exact Date) Comment: started menses this am per patient  SpO2 100%   BMI 34.45 kg/m   General:   Alert,  Well-developed, well-nourished, pleasant and cooperative in  NAD Airway:  Mallampati 3 Lungs:  Clear throughout to auscultation.   Heart:  Regular rate and rhythm; no murmurs, clicks, rubs,  or gallops. Abdomen:  Soft, nontender and nondistended. Normal bowel sounds.   Neuro/Psych:  Normal mood and affect. A and O x 3  Morgan Ramsey E. Candis Schatz, MD [facility]

## 2022-05-08 NOTE — Progress Notes (Signed)
Pt's states no medical or surgical changes since previsit or office visit. 

## 2022-05-08 NOTE — Op Note (Signed)
Morgan Ramsey Patient Name: Morgan Ramsey Procedure Date: 05/08/2022 7:35 AM MRN: [id number] Endoscopist: Nicki Reaper E. Candis Schatz , MD Age: 46 Referring MD:  Date of Birth: 1976-01-15 Gender: Female Account #: [id number] Procedure:                Colonoscopy Indications:              Positive fecal immunochemical test Medicines:                Monitored Anesthesia Care Procedure:                Pre-Anesthesia Assessment:                           - Prior to the procedure, a History and Physical                            was performed, and patient medications and                            allergies were reviewed. The patient's tolerance of                            previous anesthesia was also reviewed. The risks                            and benefits of the procedure and the sedation                            options and risks were discussed with the patient.                            All questions were answered, and informed consent                            was obtained. Prior Anticoagulants: The patient has                            taken no previous anticoagulant or antiplatelet                            agents. ASA Grade Assessment: II - A patient with                            mild systemic disease. After reviewing the risks                            and benefits, the patient was deemed in                            satisfactory condition to undergo the procedure.                           After obtaining informed consent, the colonoscope  was passed under direct vision. Throughout the                            procedure, the patient's blood pressure, pulse, and                            oxygen saturations were monitored continuously. The                            CF [id number] #[id number] was introduced through the anus                            and advanced to the the cecum, identified by                            appendiceal  orifice and ileocecal valve. The                            colonoscopy was performed without difficulty. The                            patient tolerated the procedure well. The quality                            of the bowel preparation was good. The ileocecal                            valve, appendiceal orifice, and rectum were                            photographed. The bowel preparation used was SUPREP                            via split dose instruction. Scope In: 8:12:06 AM Scope Out: 8:28:35 AM Scope Withdrawal Time: 0 hours 12 minutes 51 seconds  Total Procedure Duration: 0 hours 16 minutes 29 seconds  Findings:                 Skin tags were found on perianal exam.                           The digital rectal exam was normal. Pertinent                            negatives include normal sphincter tone and no                            palpable rectal lesions.                           A 10 mm polyp was found in the descending colon.                            The polyp was pedunculated. The polyp was removed  with a hot snare. Resection and retrieval were                            complete. Estimated blood loss: none.                           The exam was otherwise normal throughout the                            examined colon.                           Non-bleeding internal hemorrhoids were found during                            retroflexion. The hemorrhoids were Grade I                            (internal hemorrhoids that do not prolapse).                           No additional abnormalities were found on                            retroflexion. Complications:            No immediate complications. Estimated Blood Loss:     Estimated blood loss: none. Impression:               - Perianal skin tags found on perianal exam.                           - One 10 mm polyp in the descending colon, removed                            with a hot snare.  Resected and retrieved.                           - Non-bleeding internal hemorrhoids. Recommendation:           - Patient has a contact number available for                            emergencies. The signs and symptoms of potential                            delayed complications were discussed with the                            patient. Return to normal activities tomorrow.                            Written discharge instructions were provided to the                            patient.                           -  Resume previous diet.                           - Continue present medications.                           - Await pathology results.                           - Repeat colonoscopy (date not yet determined) for                            surveillance based on pathology results. Jenny Lai E. Candis Schatz, MD 05/08/2022 8:35:23 AM This report has been signed electronically.

## 2022-05-08 NOTE — Progress Notes (Signed)
Report to PACU, RN, vss, BBS= Clear.  

## 2022-05-08 NOTE — Progress Notes (Signed)
Called to room to assist during endoscopic procedure.  Patient ID and intended procedure confirmed with present staff. Received instructions for my participation in the procedure from the performing physician.  

## 2022-05-08 NOTE — Patient Instructions (Addendum)
  Handout on polyps & hemorrhoids given to you   Await pathology results on polyp removed    USTED TUVO UN PROCEDIMIENTO ENDOSCPICO HOY EN EL [facility]:   Lea el informe del procedimiento que se le entreg para cualquier pregunta especfica sobre lo que se Primary school teacher.  Si el informe del examen no responde a sus preguntas, por favor llame a su gastroenterlogo para aclararlo.  Si usted solicit que no se le den [employer] de lo que se Estate manager/land agent en su procedimiento al [employer] va a cuidar, entonces el informe del procedimiento se ha incluido en un sobre sellado para que usted lo revise despus cuando le sea ms conveniente.   LO QUE PUEDE ESPERAR: Algunas sensaciones de hinchazn en el abdomen.  Puede tener ms gases de lo normal.  El caminar puede ayudarle a eliminar el aire que se le puso en el tracto gastrointestinal durante el procedimiento y reducir la hinchazn.  Si le hicieron una endoscopia inferior (como una colonoscopia o una sigmoidoscopia flexible), podra notar manchas de sangre en las heces fecales o en el papel higinico.  Si se someti a una preparacin intestinal para su procedimiento, es posible que no tenga una evacuacin intestinal normal durante [employer].   Tenga en cuenta:  Es posible que note un poco de irritacin y congestin en la nariz o algn drenaje.  Esto es debido al oxgeno [employer] durante su procedimiento.  No hay que preocuparse y esto debe desaparecer ms o Scientist, research (medical).   SNTOMAS PARA REPORTAR INMEDIATAMENTE:  Despus de una endoscopia inferior (colonoscopia o sigmoidoscopia flexible):  Cantidades excesivas de sangre en las heces fecales  Sensibilidad significativa o empeoramiento de los dolores abdominales   Hinchazn aguda del abdomen que antes no tena   Fiebre de 100F o ms     Para asuntos urgentes o de Freight forwarder, puede comunicarse con un gastroenterlogo a cualquier hora llamando al [phone].  DIETA:   Recomendamos una comida pequea al principio, pero luego puede continuar con su dieta normal.  Tome muchos lquidos, Teacher, adult education las bebidas alcohlicas durante 24 horas.    ACTIVIDAD:  Debe planear tomarse las cosas con calma por el resto del da y no debe CONDUCIR ni usar maquinaria pesada Programmer, applications (debido a los medicamentos de sedacin utilizados durante el examen).     SEGUIMIENTO: Nuestro personal llamar al nmero que aparece en su historial al siguiente da hbil de su procedimiento para ver cmo se siente y para responder cualquier pregunta o inquietud que pueda tener con respecto a la informacin que se le dio despus del procedimiento. Si no podemos contactarle, le dejaremos un mensaje.  Sin embargo, si se siente bien y no tiene Paediatric nurse, no es necesario que nos devuelva la llamada.  Asumiremos que ha regresado a sus actividades diarias normales sin incidentes. Si se le tomaron algunas biopsias, le contactaremos por telfono o por carta en las prximas 3 semanas.  Si no ha sabido [employer] biopsias en el transcurso de 3 semanas, por favor llmenos al [phone].   FIRMAS/CONFIDENCIALIDAD: Usted y/o el acompaante que le cuide han firmado documentos que se ingresarn en su historial mdico electrnico.  Estas firmas atestiguan el hecho de que la informacin anterior

## 2022-05-09 ENCOUNTER — Telehealth: Payer: Self-pay

## 2022-05-09 NOTE — Telephone Encounter (Signed)
  Follow up Call-     05/08/2022    7:45 AM  Call back number  Post procedure Call Back phone  # [phone]  Permission to leave phone message Yes     Patient questions:  Do you have a fever, pain , or abdominal swelling? No. Pain Score  0 *  Have you tolerated food without any problems? Yes.    Have you been able to return to your normal activities? Yes.    Do you have any questions about your discharge instructions: Diet   No. Medications  No. Follow up visit  No.  Do you have questions or concerns about your Care? No.  Actions: * If pain score is 4 or above: No action needed, pain <4.

## 2022-05-10 NOTE — Progress Notes (Signed)
[name],  Good news: the polyp that I removed during your recent examination was NOT precancerous.  You should continue to follow current colorectal cancer screening guidelines with a repeat colonoscopy in 10 years.    If you develop any new rectal bleeding, abdominal pain or significant bowel habit changes, please contact me before then.

## 2022-05-17 ENCOUNTER — Other Ambulatory Visit: Payer: Self-pay

## 2022-05-17 ENCOUNTER — Ambulatory Visit: Payer: Self-pay | Attending: Physician Assistant | Admitting: Physician Assistant

## 2022-05-17 VITALS — BP 132/86 | HR 85 | Temp 98.2°F | Ht 65.0 in | Wt 208.8 lb

## 2022-05-17 DIAGNOSIS — E669 Obesity, unspecified: Secondary | ICD-10-CM

## 2022-05-17 DIAGNOSIS — E611 Iron deficiency: Secondary | ICD-10-CM

## 2022-05-17 DIAGNOSIS — R202 Paresthesia of skin: Secondary | ICD-10-CM

## 2022-05-17 DIAGNOSIS — E1169 Type 2 diabetes mellitus with other specified complication: Secondary | ICD-10-CM

## 2022-05-17 DIAGNOSIS — E785 Hyperlipidemia, unspecified: Secondary | ICD-10-CM

## 2022-05-17 DIAGNOSIS — R42 Dizziness and giddiness: Secondary | ICD-10-CM

## 2022-05-17 DIAGNOSIS — Z789 Other specified health status: Secondary | ICD-10-CM

## 2022-05-17 LAB — POCT GLYCOSYLATED HEMOGLOBIN (HGB A1C): HbA1c, POC (controlled diabetic range): 8.8 % — AB (ref 0.0–7.0)

## 2022-05-17 LAB — GLUCOSE, POCT (MANUAL RESULT ENTRY): POC Glucose: 205 mg/dl — AB (ref 70–99)

## 2022-05-17 MED ORDER — ATORVASTATIN CALCIUM 10 MG PO TABS
10.0000 mg | ORAL_TABLET | Freq: Every day | ORAL | 1 refills | Status: DC
  Filled 2022-05-17: qty 90, 90d supply, fill #0
  Filled 2022-10-24: qty 90, 90d supply, fill #1

## 2022-05-17 MED ORDER — GLIMEPIRIDE 4 MG PO TABS
4.0000 mg | ORAL_TABLET | Freq: Every day | ORAL | 0 refills | Status: DC
  Filled 2022-05-17 – 2022-05-29 (×2): qty 90, 90d supply, fill #0
  Filled 2022-06-05: qty 30, 30d supply, fill #0
  Filled 2022-07-05: qty 30, 30d supply, fill #1

## 2022-05-17 MED ORDER — EMPAGLIFLOZIN 25 MG PO TABS
25.0000 mg | ORAL_TABLET | Freq: Every day | ORAL | 1 refills | Status: DC
  Filled 2022-05-17: qty 14, 14d supply, fill #0

## 2022-05-17 MED ORDER — FLUTICASONE PROPIONATE 50 MCG/ACT NA SUSP
2.0000 | Freq: Every day | NASAL | 6 refills | Status: DC
  Filled 2022-05-17: qty 16, 30d supply, fill #0

## 2022-05-17 NOTE — Patient Instructions (Addendum)
Drink 80-100 ounces water daily  Mareos Dizziness Los mareos son un problema muy frecuente. Causan sensacin de inestabilidad o de desvanecimiento. Puede sentir que se va a desmayar. Los [employer] pueden provocarle una lesin si se tropieza o se cae. La causa puede deberse a [employer], tales como los siguientes: Medicamentos. No tener suficiente agua en el cuerpo (deshidratacin). Enfermedad. Siga estas instrucciones en su casa: Comida y bebida  Beba suficiente lquido para mantener el pis (orina) de color amarillo plido. Esto evita la deshidratacin. Trate de beber ms lquidos transparentes, como agua. No beba alcohol. Limite la cantidad de cafena que bebe o come si el mdico se lo indica. Limite la cantidad de sal (sodio) que bebe o come si el mdico se lo indica. Actividad  Evite los movimientos rpidos. Cuando se levante de una silla, hgalo con lentitud y sujtese hasta sentirse bien. Por la maana, sintese primero a un lado de la cama. Cuando se sienta bien, pngase lentamente de pie mientras se sostiene de algo. Haga esto hasta que se sienta seguro en cuanto al equilibrio. Mueva las piernas con frecuencia si debe estar de pie en un lugar durante mucho tiempo. Mientras est de pie, contraiga y relaje los msculos de las piernas. No conduzca vehculos ni opere maquinaria si se siente mareado. Evite agacharse si se siente mareado. En su casa, coloque los objetos en algn lugar que le resulte fcil alcanzarlos sin agacharse. Estilo de vida No fume ni consuma ningn producto que contenga nicotina o tabaco. Si necesita ayuda para dejar de fumar, consulte al mdico. Intente bajar el nivel de estrs. Para hacerlo, puede usar mtodos como el yoga o la meditacin. Hable con el mdico si necesita ayuda. Instrucciones generales Controle sus mareos para ver si hay cambios. Use los medicamentos de venta libre y los recetados solamente como se lo haya indicado el mdico. Hable con el  mdico si cree que la causa de sus mareos es algn medicamento que est tomando. Infrmele a un amigo o a un familiar si se siente mareado. Pdale a esta persona que llame al mdico si observa cambios en su comportamiento. Concurra a [place]. Comunquese con un mdico si: Los [employer]. Los [employer] o la sensacin de Engineer, petroleum. Tiene ganas de vomitar (nuseas). Tiene problemas para escuchar. Aparecen nuevos sntomas. Siente inestabilidad al estar de pie. Siente que la Development worker, international aid vueltas. Dolor o rigidez en el cuello. Tiene fiebre. Solicite ayuda de inmediato si: Vomita o tiene heces acuosas (diarrea), y no puede comer o beber nada. Tiene dificultad para hacer lo siguiente: Hablar. Caminar. [place]. Usar los brazos, las [place] piernas. Se siente constantemente dbil. No piensa con claridad o tiene dificultad para armar oraciones. Es posible que un amigo o un familiar adviertan que esto ocurre. Tiene los siguientes sntomas: Tourist information centre manager. Dolor en el vientre (abdomen). Falta de aire. Sudoracin. Presenta cambios en la visin. Sangrado. Tiene un dolor de cabeza muy intenso. Estos sntomas pueden Sales executive. Solicite ayuda de inmediato. Comunquese con el servicio de emergencias de su localidad (911 en los [place]). No espere a ver si los sntomas desaparecen. No conduzca por sus propios medios [employer]. Resumen Los mareos causan sensacin de inestabilidad o de desvanecimiento. Puede sentir que se va a desmayar. Beba suficiente lquido para Contractor pis (orina) de color amarillo plido. No beba alcohol. Evite los movimientos rpidos si se siente mareado. Controle sus mareos para ver si hay cambios.  Esta informacin no tiene Marine scientist el consejo del mdico. Asegrese de hacerle al mdico cualquier pregunta que tenga. Document Revised: 07/26/2020 Document Reviewed: 07/26/2020 Elsevier  Patient Education  [place].

## 2022-05-17 NOTE — Progress Notes (Signed)
Patient ID: Morgan Ramsey, female   DOB: [birth year], 46 y.o.   MRN: [id number]    Morgan Ramsey, is a 46 y.o. female  [id number]  [id number]  DOB - Nov 21, 1975  Chief Complaint  Patient presents with   Diabetes    Diabetes f/u. Dizziness, numbness & tingling of L arm and L side of face X1 week.  Yes to flu vax.       Subjective:   Morgan Ramsey is a 46 y.o. female here today for med RF.  She occasionally checks her blood sugars.  She says they run 120-200 but she rarely checks them.  Not taking jardiance but even when she was DM was not controlled.   Occasional dizziness for about 3 weeks.  Sometimes has paresthesias in L arm and face.  No weakness.  No slurred speech.   Poor water intake.does not follow diabetic diet.  No CP, no SOB.  Does not drink much water.   No problems updated.  ALLERGIES: Allergies  Allergen Reactions   Keflex [Cephalexin]     Chest tightness and SOB when prescribed 01/04/22 for strep throat   Metformin And Related     Diarrhea    PAST MEDICAL HISTORY: Past Medical History:  Diagnosis Date   Allergy    pollen   AMA (advanced maternal age) multigravida 35+    Anemia    Gestational diabetes 2017   glyburide   Herpes    History of candidal vulvovaginitis    History of chlamydia 2008   Hyperlipidemia    Obese     MEDICATIONS AT HOME: Prior to Admission medications   Medication Sig Start Date End Date Taking? Authorizing Provider  glucose blood (TRUE METRIX BLOOD GLUCOSE TEST) test strip Use to check blood sugar up to 3 times daily. 10/27/21  Yes Ladell Pier, MD  TRUEplus Lancets 28G MISC Use to check blood sugar up to 3 times daily. 10/27/21  Yes Ladell Pier, MD  atorvastatin (LIPITOR) 10 MG tablet TAKE 1 TABLET (10 MG TOTAL) BY MOUTH DAILY. 05/17/22   Argentina Donovan, PA-C  empagliflozin (JARDIANCE) 25 MG TABS tablet Take 1 tablet (25 mg total) by mouth daily before breakfast. 05/17/22  Yes Selene Peltzer,  Antwon Rochin M, PA-C  glimepiride (AMARYL) 4 MG tablet Take 1 tablet (4 mg total) by mouth daily with breakfast. 05/17/22   Argentina Donovan, PA-C  metFORMIN (GLUCOPHAGE) 1000 MG tablet Take 1 tablet (1,000 mg total) by mouth 2 (two) times daily with a meal. 07/16/20 09/14/20  Ladell Pier, MD    ROS: Neg HEENT Neg resp Neg cardiac Neg GI Neg GU Neg MS Neg psych Neg neuro  Objective:   Vitals:   05/17/22 0838  BP: 132/86  Pulse: 85  Temp: 98.2 F (36.8 C)  TempSrc: Oral  SpO2: 99%  Weight: 208 lb 12.8 oz (94.7 kg)  Height: '5\' 5"'$  (1.651 m)   Exam General appearance : Awake, alert, not in any distress. Speech Clear. Not toxic looking HEENT: Atraumatic and Normocephalic, pupils equally reactive to light and accomodation, EOM intact Neck: Supple, no JVD. No cervical lymphadenopathy.  Chest: Good air entry bilaterally, CTAB.  No rales/rhonchi/wheezing CVS: S1 S2 regular, no murmurs.  Extremities: B/L Lower Ext shows no edema, both legs are warm to touch.  BUE with full S&ROM.  Sensory intact Neurology: Awake alert, and oriented X 3, CN II-XII intact, Non focal Skin: No Rash  Data Review Lab Results  Component Value Date   HGBA1C  8.8 (A) 05/17/2022   HGBA1C 8.4 (A) 01/27/2022   HGBA1C 8.0 (A) 09/27/2021    Assessment & Plan   1. Type 2 diabetes mellitus with obesity (HCC) Uncontrolled.  Increase jardiance(she has not been taking it but it was not controlled on '10mg'$ ) - Glucose (CBG) - HgB A1c - glimepiride (AMARYL) 4 MG tablet; Take 1 tablet (4 mg total) by mouth daily with breakfast.  Dispense: 90 tablet; Refill: 0 - empagliflozin (JARDIANCE) 25 MG TABS tablet; Take 1 tablet (25 mg total) by mouth daily before breakfast.  Dispense: 90 tablet; Refill: 1  2. Iron deficiency Not taking iron - CBC with Differential - Iron, TIBC and Ferritin Panel  3. Language barrier [employer] "josiah" interpreters used and additional time performing visit was required.   4.  Hyperlipidemia associated with type 2 diabetes mellitus (HCC) - atorvastatin (LIPITOR) 10 MG tablet; TAKE 1 TABLET (10 MG TOTAL) BY MOUTH DAILY.  Dispense: 90 tablet; Refill: 1  5. Dizziness No red flags.  Increase water intake-drink 80-100 ounces water daily - CBC with Differential - Iron, TIBC and Ferritin Panel - TSH -flonase  6. Paresthesias - CBC with Differential - Iron, TIBC and Ferritin Panel - TSH    Return in about 3 months (around 08/17/2022) for PCP for chronic conditions.  The patient was given clear instructions to go to ER or return to medical center if symptoms don't improve, worsen or new problems develop. The patient verbalized understanding. The patient was told to call to get lab results if they haven't heard anything in the next week.      Freeman Caldron, PA-C [facility] and [facility] [place], [place]   05/17/2022, 9:04 AM

## 2022-05-18 ENCOUNTER — Other Ambulatory Visit: Payer: Self-pay

## 2022-05-18 ENCOUNTER — Other Ambulatory Visit: Payer: Self-pay | Admitting: Physician Assistant

## 2022-05-18 ENCOUNTER — Other Ambulatory Visit: Payer: Self-pay | Admitting: Pharmacist

## 2022-05-18 LAB — IRON,TIBC AND FERRITIN PANEL
Ferritin: 9 ng/mL — ABNORMAL LOW (ref 15–150)
Iron Saturation: 7 % — CL (ref 15–55)
Iron: 26 ug/dL — ABNORMAL LOW (ref 27–159)
Total Iron Binding Capacity: 387 ug/dL (ref 250–450)
UIBC: 361 ug/dL (ref 131–425)

## 2022-05-18 LAB — CBC WITH DIFFERENTIAL/PLATELET
Basophils Absolute: 0 10*3/uL (ref 0.0–0.2)
Basos: 1 %
EOS (ABSOLUTE): 0.2 10*3/uL (ref 0.0–0.4)
Eos: 3 %
Hematocrit: 35.6 % (ref 34.0–46.6)
Hemoglobin: 11.1 g/dL (ref 11.1–15.9)
Immature Grans (Abs): 0 10*3/uL (ref 0.0–0.1)
Immature Granulocytes: 0 %
Lymphocytes Absolute: 1.7 10*3/uL (ref 0.7–3.1)
Lymphs: 25 %
MCH: 23.5 pg — ABNORMAL LOW (ref 26.6–33.0)
MCHC: 31.2 g/dL — ABNORMAL LOW (ref 31.5–35.7)
MCV: 75 fL — ABNORMAL LOW (ref 79–97)
Monocytes Absolute: 0.5 10*3/uL (ref 0.1–0.9)
Monocytes: 8 %
Neutrophils Absolute: 4.4 10*3/uL (ref 1.4–7.0)
Neutrophils: 63 %
Platelets: 333 10*3/uL (ref 150–450)
RBC: 4.73 x10E6/uL (ref 3.77–5.28)
RDW: 13.3 % (ref 11.7–15.4)
WBC: 6.9 10*3/uL (ref 3.4–10.8)

## 2022-05-18 LAB — TSH: TSH: 2.76 u[IU]/mL (ref 0.450–4.500)

## 2022-05-18 MED ORDER — IRON (FERROUS SULFATE) 325 (65 FE) MG PO TABS: ORAL_TABLET | ORAL | 1 refills | Status: DC

## 2022-05-18 MED ORDER — DAPAGLIFLOZIN PROPANEDIOL 10 MG PO TABS
10.0000 mg | ORAL_TABLET | Freq: Every day | ORAL | 3 refills | Status: DC
  Filled 2022-05-18 – 2022-06-05 (×4): qty 30, 30d supply, fill #0
  Filled 2022-07-05: qty 30, 30d supply, fill #1

## 2022-05-23 ENCOUNTER — Other Ambulatory Visit: Payer: Self-pay

## 2022-05-24 ENCOUNTER — Other Ambulatory Visit: Payer: Self-pay

## 2022-05-26 ENCOUNTER — Other Ambulatory Visit: Payer: Self-pay

## 2022-05-29 ENCOUNTER — Other Ambulatory Visit: Payer: Self-pay

## 2022-06-05 ENCOUNTER — Other Ambulatory Visit: Payer: Self-pay

## 2022-06-06 ENCOUNTER — Telehealth: Payer: Self-pay

## 2022-06-06 ENCOUNTER — Other Ambulatory Visit: Payer: Self-pay

## 2022-06-06 NOTE — Telephone Encounter (Signed)
Morgan Ramsey PAP denied due to proof of income documents required and not provided by patient (spoke to pt via interpreter 02/08/22, app given to daughter 04/01/22, completed app submitted 05/09/22 without proof of income documentation).  Morgan Ramsey PAP denied due to [facility] Medicaid denial letter document required.  Spoke to daughter face-to-face, Morgan Ramsey, and provided printed instructions on how to obtain a Medicaid denial letter and printed examples of acceptable proof of income documentation.  Advised Morgan Ramsey that I would need documents within the next 2 weeks in order for patient to obtain refills free through PAP programs or refills will be over $600/therapy would need to be changed.

## 2022-07-05 ENCOUNTER — Other Ambulatory Visit: Payer: Self-pay

## 2022-07-07 ENCOUNTER — Other Ambulatory Visit: Payer: Self-pay

## 2022-07-14 ENCOUNTER — Other Ambulatory Visit: Payer: Self-pay

## 2022-07-23 ENCOUNTER — Inpatient Hospital Stay (HOSPITAL_BASED_OUTPATIENT_CLINIC_OR_DEPARTMENT_OTHER)
Admission: EM | Admit: 2022-07-23 | Discharge: 2022-07-26 | DRG: 872 | Disposition: A | Discharge status: Home / Self Care | Payer: Self-pay | Attending: Internal Medicine | Admitting: Internal Medicine

## 2022-07-23 ENCOUNTER — Encounter (HOSPITAL_BASED_OUTPATIENT_CLINIC_OR_DEPARTMENT_OTHER): Payer: Self-pay | Admitting: Emergency Medicine

## 2022-07-23 ENCOUNTER — Encounter (HOSPITAL_COMMUNITY): Payer: Self-pay

## 2022-07-23 ENCOUNTER — Emergency Department (HOSPITAL_BASED_OUTPATIENT_CLINIC_OR_DEPARTMENT_OTHER): Payer: Self-pay

## 2022-07-23 ENCOUNTER — Other Ambulatory Visit: Payer: Self-pay

## 2022-07-23 DIAGNOSIS — E785 Hyperlipidemia, unspecified: Secondary | ICD-10-CM | POA: Diagnosis present

## 2022-07-23 DIAGNOSIS — A419 Sepsis, unspecified organism: Secondary | ICD-10-CM | POA: Diagnosis present

## 2022-07-23 DIAGNOSIS — R6520 Severe sepsis without septic shock: Secondary | ICD-10-CM | POA: Diagnosis present

## 2022-07-23 DIAGNOSIS — Z6834 Body mass index (BMI) 34.0-34.9, adult: Secondary | ICD-10-CM

## 2022-07-23 DIAGNOSIS — N12 Tubulo-interstitial nephritis, not specified as acute or chronic: Secondary | ICD-10-CM

## 2022-07-23 DIAGNOSIS — E6609 Other obesity due to excess calories: Secondary | ICD-10-CM | POA: Diagnosis present

## 2022-07-23 DIAGNOSIS — N10 Acute tubulo-interstitial nephritis: Secondary | ICD-10-CM | POA: Diagnosis present

## 2022-07-23 DIAGNOSIS — A4151 Sepsis due to Escherichia coli [E. coli]: Principal | ICD-10-CM | POA: Diagnosis present

## 2022-07-23 DIAGNOSIS — Z794 Long term (current) use of insulin: Secondary | ICD-10-CM

## 2022-07-23 DIAGNOSIS — Z20822 Contact with and (suspected) exposure to covid-19: Secondary | ICD-10-CM | POA: Diagnosis present

## 2022-07-23 DIAGNOSIS — E669 Obesity, unspecified: Secondary | ICD-10-CM | POA: Diagnosis present

## 2022-07-23 DIAGNOSIS — E66811 Other obesity due to excess calories: Secondary | ICD-10-CM

## 2022-07-23 DIAGNOSIS — E872 Acidosis, unspecified: Secondary | ICD-10-CM

## 2022-07-23 DIAGNOSIS — E119 Type 2 diabetes mellitus without complications: Secondary | ICD-10-CM | POA: Diagnosis present

## 2022-07-23 DIAGNOSIS — E1169 Type 2 diabetes mellitus with other specified complication: Secondary | ICD-10-CM | POA: Diagnosis present

## 2022-07-23 DIAGNOSIS — E876 Hypokalemia: Secondary | ICD-10-CM | POA: Diagnosis present

## 2022-07-23 HISTORY — DX: Type 2 diabetes mellitus without complications: E11.9

## 2022-07-23 LAB — BASIC METABOLIC PANEL
Anion gap: 14 (ref 5–15)
BUN: 10 mg/dL (ref 6–20)
CO2: 25 mmol/L (ref 22–32)
Calcium: 9.6 mg/dL (ref 8.9–10.3)
Chloride: 96 mmol/L — ABNORMAL LOW (ref 98–111)
Creatinine, Ser: 0.66 mg/dL (ref 0.44–1.00)
GFR, Estimated: >60 mL/min (ref >60)
Glucose, Bld: 218 mg/dL — ABNORMAL HIGH (ref 70–99)
Potassium: 3.4 mmol/L — ABNORMAL LOW (ref 3.5–5.1)
Sodium: 135 mmol/L (ref 135–145)

## 2022-07-23 LAB — CBC WITH DIFFERENTIAL/PLATELET
Abs Immature Granulocytes: 0.03 10*3/uL (ref 0.00–0.07)
Basophils Absolute: 0.1 10*3/uL (ref 0.0–0.1)
Basophils Relative: 1 %
Eosinophils Absolute: 0.1 10*3/uL (ref 0.0–0.5)
Eosinophils Relative: 1 %
HCT: 37.2 % (ref 36.0–46.0)
Hemoglobin: 12 g/dL (ref 12.0–15.0)
Immature Granulocytes: 0 %
Lymphocytes Relative: 11 %
Lymphs Abs: 1 10*3/uL (ref 0.7–4.0)
MCH: 23.3 pg — ABNORMAL LOW (ref 26.0–34.0)
MCHC: 32.3 g/dL (ref 30.0–36.0)
MCV: 72.2 fL — ABNORMAL LOW (ref 80.0–100.0)
Monocytes Absolute: 0.2 10*3/uL (ref 0.1–1.0)
Monocytes Relative: 2 %
Neutro Abs: 7.6 10*3/uL (ref 1.7–7.7)
Neutrophils Relative %: 85 %
Platelets: 324 10*3/uL (ref 150–400)
RBC: 5.15 MIL/uL — ABNORMAL HIGH (ref 3.87–5.11)
RDW: 14.6 % (ref 11.5–15.5)
WBC: 9 10*3/uL (ref 4.0–10.5)
nRBC: 0 % (ref 0.0–0.2)

## 2022-07-23 LAB — MAGNESIUM: Magnesium: 1.6 mg/dL — ABNORMAL LOW (ref 1.7–2.4)

## 2022-07-23 LAB — URINALYSIS, ROUTINE W REFLEX MICROSCOPIC
Bilirubin Urine: NEGATIVE
Glucose, UA: NEGATIVE mg/dL
Ketones, ur: NEGATIVE mg/dL
Nitrite: POSITIVE — AB
Protein, ur: 100 mg/dL — AB
Specific Gravity, Urine: 1.014 (ref 1.005–1.030)
Trans Epithel, UA: 1
WBC, UA: >50 WBC/hpf — ABNORMAL HIGH (ref 0–5)
pH: 5.5 (ref 5.0–8.0)

## 2022-07-23 LAB — LACTIC ACID, PLASMA
Lactic Acid, Venous: 3 mmol/L (ref 0.5–1.9)
Lactic Acid, Venous: 2.9 mmol/L (ref 0.5–1.9)

## 2022-07-23 LAB — RESP PANEL BY RT-PCR (RSV, FLU A&B, COVID)  RVPGX2
Influenza A by PCR: NEGATIVE
Influenza B by PCR: NEGATIVE
Resp Syncytial Virus by PCR: NEGATIVE
SARS Coronavirus 2 by RT PCR: NEGATIVE

## 2022-07-23 LAB — TROPONIN I (HIGH SENSITIVITY): Troponin I (High Sensitivity): 3 ng/L (ref <18)

## 2022-07-23 LAB — PREGNANCY, URINE: Preg Test, Ur: NEGATIVE

## 2022-07-23 LAB — PROTIME-INR
INR: 1 (ref 0.8–1.2)
Prothrombin Time: 13.1 seconds (ref 11.4–15.2)

## 2022-07-23 LAB — APTT: aPTT: 27 seconds (ref 24–36)

## 2022-07-23 MED ORDER — IOHEXOL 350 MG/ML SOLN
100.0000 mL | Freq: Once | INTRAVENOUS | Status: AC | PRN
  Administered 2022-07-23: 100 mL via INTRAVENOUS

## 2022-07-23 MED ORDER — POTASSIUM CHLORIDE CRYS ER 20 MEQ PO TBCR
20.0000 meq | EXTENDED_RELEASE_TABLET | Freq: Once | ORAL | Status: AC
  Administered 2022-07-23: 20 meq via ORAL
  Filled 2022-07-23: qty 1

## 2022-07-23 MED ORDER — ACETAMINOPHEN 500 MG PO TABS
1000.0000 mg | ORAL_TABLET | Freq: Once | ORAL | Status: AC
  Administered 2022-07-23: 1000 mg via ORAL
  Filled 2022-07-23: qty 2

## 2022-07-23 MED ORDER — MAGNESIUM OXIDE -MG SUPPLEMENT 400 (240 MG) MG PO TABS
400.0000 mg | ORAL_TABLET | Freq: Once | ORAL | Status: AC
  Administered 2022-07-24: 400 mg via ORAL
  Filled 2022-07-23: qty 1

## 2022-07-23 MED ORDER — LACTATED RINGERS IV BOLUS (SEPSIS)
1000.0000 mL | Freq: Once | INTRAVENOUS | Status: AC
  Administered 2022-07-23: 1000 mL via INTRAVENOUS

## 2022-07-23 MED ORDER — SODIUM CHLORIDE 0.9 % IV SOLN
2.0000 g | INTRAVENOUS | Status: DC
  Administered 2022-07-23 – 2022-07-25 (×3): 2 g via INTRAVENOUS
  Filled 2022-07-23 (×4): qty 20

## 2022-07-23 MED ORDER — LACTATED RINGERS IV SOLN: INTRAVENOUS | Status: DC

## 2022-07-23 MED ORDER — MAGNESIUM SULFATE 2 GM/50ML IV SOLN
2.0000 g | Freq: Once | INTRAVENOUS | Status: AC
  Administered 2022-07-23: 2 g via INTRAVENOUS

## 2022-07-23 MED ORDER — LACTATED RINGERS IV BOLUS
1000.0000 mL | Freq: Once | INTRAVENOUS | Status: AC
  Administered 2022-07-23: 1000 mL via INTRAVENOUS

## 2022-07-24 DIAGNOSIS — A419 Sepsis, unspecified organism: Secondary | ICD-10-CM

## 2022-07-24 DIAGNOSIS — E6609 Other obesity due to excess calories: Secondary | ICD-10-CM

## 2022-07-24 DIAGNOSIS — N390 Urinary tract infection, site not specified: Secondary | ICD-10-CM

## 2022-07-24 DIAGNOSIS — E1169 Type 2 diabetes mellitus with other specified complication: Secondary | ICD-10-CM | POA: Diagnosis present

## 2022-07-24 LAB — BLOOD CULTURE ID PANEL (REFLEXED) - BCID2

## 2022-07-24 LAB — TROPONIN I (HIGH SENSITIVITY): Troponin I (High Sensitivity): 3 ng/L (ref <18)

## 2022-07-24 LAB — GLUCOSE, CAPILLARY
Glucose-Capillary: 226 mg/dL — ABNORMAL HIGH (ref 70–99)
Glucose-Capillary: 221 mg/dL — ABNORMAL HIGH (ref 70–99)

## 2022-07-24 LAB — CBG MONITORING, ED: Glucose-Capillary: 152 mg/dL — ABNORMAL HIGH (ref 70–99)

## 2022-07-24 LAB — PROCALCITONIN: Procalcitonin: 0.48 ng/mL

## 2022-07-24 LAB — HEMOGLOBIN A1C
Hgb A1c MFr Bld: 7.5 % — ABNORMAL HIGH (ref 4.8–5.6)
Mean Plasma Glucose: 169 mg/dL

## 2022-07-24 LAB — LACTIC ACID, PLASMA: Lactic Acid, Venous: 0.8 mmol/L (ref 0.5–1.9)

## 2022-07-24 MED ORDER — MORPHINE SULFATE (PF) 2 MG/ML IV SOLN
2.0000 mg | INTRAVENOUS | Status: DC | PRN
  Administered 2022-07-24: 2 mg via INTRAVENOUS
  Filled 2022-07-24: qty 1

## 2022-07-24 MED ORDER — INSULIN ASPART 100 UNIT/ML IJ SOLN
0.0000 [IU] | Freq: Every day | INTRAMUSCULAR | Status: DC
  Administered 2022-07-24 – 2022-07-25 (×2): 2 [IU] via SUBCUTANEOUS

## 2022-07-24 MED ORDER — ONDANSETRON HCL 4 MG PO TABS: 4.0000 mg | ORAL_TABLET | Freq: Four times a day (QID) | ORAL | Status: DC | PRN

## 2022-07-24 MED ORDER — HYDRALAZINE HCL 20 MG/ML IJ SOLN: 5.0000 mg | INTRAMUSCULAR | Status: DC | PRN

## 2022-07-24 MED ORDER — LACTATED RINGERS IV SOLN: INTRAVENOUS | Status: DC

## 2022-07-24 MED ORDER — ATORVASTATIN CALCIUM 10 MG PO TABS
10.0000 mg | ORAL_TABLET | Freq: Every day | ORAL | Status: DC
  Administered 2022-07-24 – 2022-07-26 (×3): 10 mg via ORAL
  Filled 2022-07-24 (×3): qty 1

## 2022-07-24 MED ORDER — BISACODYL 5 MG PO TBEC: 5.0000 mg | DELAYED_RELEASE_TABLET | Freq: Every day | ORAL | Status: DC | PRN

## 2022-07-24 MED ORDER — DOCUSATE SODIUM 100 MG PO CAPS
100.0000 mg | ORAL_CAPSULE | Freq: Two times a day (BID) | ORAL | Status: DC
  Administered 2022-07-24 – 2022-07-26 (×5): 100 mg via ORAL
  Filled 2022-07-24 (×5): qty 1

## 2022-07-24 MED ORDER — ACETAMINOPHEN 325 MG PO TABS
650.0000 mg | ORAL_TABLET | Freq: Four times a day (QID) | ORAL | Status: DC | PRN
  Administered 2022-07-24 – 2022-07-26 (×4): 650 mg via ORAL
  Filled 2022-07-24 (×4): qty 2

## 2022-07-24 MED ORDER — ENOXAPARIN SODIUM 40 MG/0.4ML IJ SOSY
40.0000 mg | PREFILLED_SYRINGE | INTRAMUSCULAR | Status: DC
  Administered 2022-07-24 – 2022-07-26 (×3): 40 mg via SUBCUTANEOUS
  Filled 2022-07-24 (×3): qty 0.4

## 2022-07-24 MED ORDER — INSULIN ASPART 100 UNIT/ML IJ SOLN
0.0000 [IU] | Freq: Three times a day (TID) | INTRAMUSCULAR | Status: DC
  Administered 2022-07-24 – 2022-07-26 (×5): 5 [IU] via SUBCUTANEOUS
  Administered 2022-07-26: 3 [IU] via SUBCUTANEOUS

## 2022-07-24 MED ORDER — METHOCARBAMOL 1000 MG/10ML IJ SOLN
1000.0000 mg | Freq: Four times a day (QID) | INTRAVENOUS | Status: DC | PRN
  Administered 2022-07-24: 1000 mg via INTRAVENOUS
  Filled 2022-07-24: qty 10

## 2022-07-24 MED ORDER — ONDANSETRON HCL 4 MG/2ML IJ SOLN: 4.0000 mg | Freq: Four times a day (QID) | INTRAMUSCULAR | Status: DC | PRN

## 2022-07-24 MED ORDER — ACETAMINOPHEN 650 MG RE SUPP: 650.0000 mg | Freq: Four times a day (QID) | RECTAL | Status: DC | PRN

## 2022-07-24 MED ORDER — POLYETHYLENE GLYCOL 3350 17 G PO PACK
17.0000 g | PACK | Freq: Every day | ORAL | Status: DC | PRN
  Administered 2022-07-24: 17 g via ORAL
  Filled 2022-07-24: qty 1

## 2022-07-24 MED ORDER — KETOROLAC TROMETHAMINE 15 MG/ML IJ SOLN
15.0000 mg | Freq: Once | INTRAMUSCULAR | Status: AC
  Administered 2022-07-24: 15 mg via INTRAVENOUS
  Filled 2022-07-24: qty 1

## 2022-07-24 MED ORDER — OXYCODONE HCL 5 MG PO TABS
5.0000 mg | ORAL_TABLET | ORAL | Status: DC | PRN
  Administered 2022-07-24 – 2022-07-26 (×5): 5 mg via ORAL
  Filled 2022-07-24 (×6): qty 1

## 2022-07-24 MED ORDER — SODIUM CHLORIDE 0.9% FLUSH
3.0000 mL | Freq: Two times a day (BID) | INTRAVENOUS | Status: DC
  Administered 2022-07-24 – 2022-07-25 (×3): 3 mL via INTRAVENOUS

## 2022-07-25 DIAGNOSIS — N12 Tubulo-interstitial nephritis, not specified as acute or chronic: Secondary | ICD-10-CM

## 2022-07-25 DIAGNOSIS — N10 Acute tubulo-interstitial nephritis: Secondary | ICD-10-CM

## 2022-07-25 DIAGNOSIS — E1169 Type 2 diabetes mellitus with other specified complication: Secondary | ICD-10-CM

## 2022-07-25 DIAGNOSIS — E669 Obesity, unspecified: Secondary | ICD-10-CM

## 2022-07-25 LAB — BASIC METABOLIC PANEL
Anion gap: 8 (ref 5–15)
BUN: 10 mg/dL (ref 6–20)
CO2: 24 mmol/L (ref 22–32)
Calcium: 8.3 mg/dL — ABNORMAL LOW (ref 8.9–10.3)
Chloride: 103 mmol/L (ref 98–111)
Creatinine, Ser: 0.69 mg/dL (ref 0.44–1.00)
GFR, Estimated: >60 mL/min (ref >60)
Glucose, Bld: 286 mg/dL — ABNORMAL HIGH (ref 70–99)
Potassium: 3.8 mmol/L (ref 3.5–5.1)
Sodium: 135 mmol/L (ref 135–145)

## 2022-07-25 LAB — GLUCOSE, CAPILLARY
Glucose-Capillary: 206 mg/dL — ABNORMAL HIGH (ref 70–99)
Glucose-Capillary: 222 mg/dL — ABNORMAL HIGH (ref 70–99)
Glucose-Capillary: 220 mg/dL — ABNORMAL HIGH (ref 70–99)

## 2022-07-25 LAB — CBC
HCT: 32.7 % — ABNORMAL LOW (ref 36.0–46.0)
Hemoglobin: 10.3 g/dL — ABNORMAL LOW (ref 12.0–15.0)
MCH: 23.5 pg — ABNORMAL LOW (ref 26.0–34.0)
MCHC: 31.5 g/dL (ref 30.0–36.0)
MCV: 74.5 fL — ABNORMAL LOW (ref 80.0–100.0)
Platelets: 243 10*3/uL (ref 150–400)
RBC: 4.39 MIL/uL (ref 3.87–5.11)
RDW: 14.9 % (ref 11.5–15.5)
WBC: 5.3 10*3/uL (ref 4.0–10.5)
nRBC: 0 % (ref 0.0–0.2)

## 2022-07-25 LAB — PROCALCITONIN: Procalcitonin: 0.35 ng/mL

## 2022-07-25 LAB — HIV ANTIBODY (ROUTINE TESTING W REFLEX): HIV Screen 4th Generation wRfx: NONREACTIVE

## 2022-07-25 MED ORDER — INSULIN GLARGINE-YFGN 100 UNIT/ML ~~LOC~~ SOLN
10.0000 [IU] | Freq: Every day | SUBCUTANEOUS | Status: DC
  Administered 2022-07-25: 10 [IU] via SUBCUTANEOUS
  Filled 2022-07-25 (×2): qty 0.1

## 2022-07-26 ENCOUNTER — Other Ambulatory Visit (HOSPITAL_COMMUNITY): Payer: Self-pay

## 2022-07-26 LAB — CBC
HCT: 32.8 % — ABNORMAL LOW (ref 36.0–46.0)
Hemoglobin: 10.9 g/dL — ABNORMAL LOW (ref 12.0–15.0)
MCH: 23.8 pg — ABNORMAL LOW (ref 26.0–34.0)
MCHC: 33.2 g/dL (ref 30.0–36.0)
MCV: 71.6 fL — ABNORMAL LOW (ref 80.0–100.0)
Platelets: 260 10*3/uL (ref 150–400)
RBC: 4.58 MIL/uL (ref 3.87–5.11)
RDW: 14.6 % (ref 11.5–15.5)
WBC: 5.5 10*3/uL (ref 4.0–10.5)
nRBC: 0 % (ref 0.0–0.2)

## 2022-07-26 LAB — URINE CULTURE: Culture: >=100000 — AB

## 2022-07-26 LAB — CULTURE, BLOOD (ROUTINE X 2)
Special Requests: ADEQUATE
Special Requests: ADEQUATE

## 2022-07-26 LAB — COMPREHENSIVE METABOLIC PANEL
ALT: 21 U/L (ref 0–44)
AST: 16 U/L (ref 15–41)
Albumin: 3 g/dL — ABNORMAL LOW (ref 3.5–5.0)
Alkaline Phosphatase: 96 U/L (ref 38–126)
Anion gap: 8 (ref 5–15)
BUN: 10 mg/dL (ref 6–20)
CO2: 25 mmol/L (ref 22–32)
Calcium: 9 mg/dL (ref 8.9–10.3)
Chloride: 102 mmol/L (ref 98–111)
Creatinine, Ser: 0.62 mg/dL (ref 0.44–1.00)
GFR, Estimated: >60 mL/min (ref >60)
Glucose, Bld: 237 mg/dL — ABNORMAL HIGH (ref 70–99)
Potassium: 3.6 mmol/L (ref 3.5–5.1)
Sodium: 135 mmol/L (ref 135–145)
Total Bilirubin: 0.3 mg/dL (ref 0.3–1.2)
Total Protein: 7 g/dL (ref 6.5–8.1)

## 2022-07-26 LAB — GLUCOSE, CAPILLARY
Glucose-Capillary: 174 mg/dL — ABNORMAL HIGH (ref 70–99)
Glucose-Capillary: 247 mg/dL — ABNORMAL HIGH (ref 70–99)

## 2022-07-26 LAB — MAGNESIUM: Magnesium: 1.7 mg/dL (ref 1.7–2.4)

## 2022-07-26 LAB — PROCALCITONIN: Procalcitonin: 0.17 ng/mL

## 2022-07-26 MED ORDER — OXYCODONE HCL 5 MG PO TABS
5.0000 mg | ORAL_TABLET | Freq: Four times a day (QID) | ORAL | 0 refills | Status: AC | PRN
  Filled 2022-07-26: qty 12, 3d supply, fill #0

## 2022-07-26 MED ORDER — SODIUM CHLORIDE 0.9 % IV SOLN
2.0000 g | INTRAVENOUS | Status: DC
  Administered 2022-07-26: 2 g via INTRAVENOUS
  Filled 2022-07-26: qty 20

## 2022-07-26 MED ORDER — SULFAMETHOXAZOLE-TRIMETHOPRIM 800-160 MG PO TABS
1.0000 | ORAL_TABLET | Freq: Two times a day (BID) | ORAL | 0 refills | Status: AC
  Filled 2022-07-26: qty 20, 10d supply, fill #0

## 2022-07-26 MED ORDER — ATORVASTATIN CALCIUM 10 MG PO TABS
10.0000 mg | ORAL_TABLET | Freq: Every day | ORAL | 3 refills | Status: DC
  Filled 2022-07-26: qty 30, 30d supply, fill #0

## 2022-07-26 MED ORDER — GLIMEPIRIDE 2 MG PO TABS
4.0000 mg | ORAL_TABLET | Freq: Every day | ORAL | 3 refills | Status: DC
  Filled 2022-07-26: qty 60, 30d supply, fill #0

## 2022-07-26 MED ORDER — MAGNESIUM SULFATE 2 GM/50ML IV SOLN
2.0000 g | Freq: Once | INTRAVENOUS | Status: AC
  Administered 2022-07-26: 2 g via INTRAVENOUS
  Filled 2022-07-26: qty 50

## 2022-07-31 ENCOUNTER — Ambulatory Visit: Payer: Self-pay | Admitting: *Deleted

## 2022-08-01 ENCOUNTER — Encounter: Payer: Self-pay | Admitting: Gastroenterology

## 2022-08-01 NOTE — Telephone Encounter (Signed)
Please reach out to patient and offer a HFU appointment.

## 2022-08-21 ENCOUNTER — Ambulatory Visit: Payer: Self-pay | Attending: Internal Medicine | Admitting: Internal Medicine

## 2022-08-21 ENCOUNTER — Encounter: Payer: Self-pay | Admitting: Internal Medicine

## 2022-08-21 ENCOUNTER — Other Ambulatory Visit: Payer: Self-pay

## 2022-08-21 ENCOUNTER — Other Ambulatory Visit (HOSPITAL_COMMUNITY)
Admission: RE | Admit: 2022-08-21 | Discharge: 2022-08-21 | Disposition: A | Discharge status: Home / Self Care | Payer: Self-pay | Source: Ambulatory Visit | Attending: Internal Medicine | Admitting: Internal Medicine

## 2022-08-21 VITALS — BP 125/83 | HR 85 | Temp 98.5°F | Ht 65.0 in | Wt 203.0 lb

## 2022-08-21 DIAGNOSIS — E1169 Type 2 diabetes mellitus with other specified complication: Secondary | ICD-10-CM

## 2022-08-21 DIAGNOSIS — E611 Iron deficiency: Secondary | ICD-10-CM

## 2022-08-21 DIAGNOSIS — Z2821 Immunization not carried out because of patient refusal: Secondary | ICD-10-CM

## 2022-08-21 DIAGNOSIS — N898 Other specified noninflammatory disorders of vagina: Secondary | ICD-10-CM | POA: Insufficient documentation

## 2022-08-21 DIAGNOSIS — E669 Obesity, unspecified: Secondary | ICD-10-CM

## 2022-08-21 LAB — GLUCOSE, POCT (MANUAL RESULT ENTRY): POC Glucose: 150 mg/dl — AB (ref 70–99)

## 2022-08-21 MED ORDER — FLUCONAZOLE 150 MG PO TABS
150.0000 mg | ORAL_TABLET | Freq: Every day | ORAL | 0 refills | Status: DC
  Filled 2022-08-21: qty 2, 2d supply, fill #0

## 2022-08-21 MED ORDER — SITAGLIPTIN PHOSPHATE 50 MG PO TABS
50.0000 mg | ORAL_TABLET | Freq: Every day | ORAL | 6 refills | Status: DC
  Filled 2022-08-21 – 2022-12-18 (×4): qty 30, 30d supply, fill #0

## 2022-08-21 MED ORDER — GLIMEPIRIDE 2 MG PO TABS
4.0000 mg | ORAL_TABLET | Freq: Every day | ORAL | 3 refills | Status: DC
  Filled 2022-08-21: qty 60, 30d supply, fill #0
  Filled 2022-10-30: qty 60, 30d supply, fill #1
  Filled 2022-11-25 – 2022-12-18 (×2): qty 60, 30d supply, fill #2
  Filled 2023-01-17: qty 60, 30d supply, fill #3

## 2022-08-21 MED ORDER — IRON (FERROUS SULFATE) 325 (65 FE) MG PO TABS
1.0000 | ORAL_TABLET | ORAL | 1 refills | Status: DC
  Filled 2022-08-21: qty 30, 70d supply, fill #0
  Filled 2022-11-25 – 2022-12-18 (×2): qty 30, 70d supply, fill #1
  Filled 2023-04-06: qty 30, 70d supply, fill #2

## 2022-08-21 NOTE — Progress Notes (Signed)
Patient ID: Morgan Ramsey, female    DOB: 1975/09/11  MRN: [id number]  CC: Diabetes (DM f/u. Requesting lancets/Discuss sepsis. Discuss iron supplements unsure if still needed./Pt is having white vaginal discharge sometimes with blood, itching, odor X2 weeks.)   Subjective: Morgan Ramsey is a 47 y.o. 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 Pt c/o white vaginal dischg with itching and odor x 2 wks.  Sometimes dischg has a little blood, sometimes clear.  No dysuria.  Sexually active with her spouse only.  Had vaginal yeast infection 01/2022. Jardiance increased to 25 mg daily on 05/17/2022 on visit with PA.  She stopped taking it shortly after that due to cost.   DM Results for orders placed or performed in visit on 08/21/22  POCT glucose (manual entry)  Result Value Ref Range   POC Glucose 150 (A) 70 - 99 mg/dl   Lab Results  Component Value Date   HGBA1C 7.5 (H) 07/24/2022  On last visit with our PA, Jardiance was increased to 25 mg daily.  She stopped the med due to cost.  She was continued on Amaryl 4 mg daily upon hosp dischg. Checks BS in a.m.  Gives range 120-160.  BS this a.m before BF was 160 Doing okay with eating habits.  Not eating bread and no sodas.  Walking in place 2x/wk at home.    Taking and tolerating atorvastatin 10 mg daily.  Iron deficiency anemia: Last CBC revealed hemoglobin of 10.9 with hematocrit of 32.  Never pick up iron supplement rxn sent to [facility] 05/2022.  She was not aware of the rxn Menses last 5-6 days with heavy bleeding.  Heavy since have IUD placed 1 year ago Lab Results  Component Value Date   IRON 26 (L) 05/17/2022   TIBC 387 05/17/2022   FERRITIN 9 (L) 05/17/2022    Positive fit test: Since last visit with  me she did have a colonoscopy.   Patient Active Problem List   Diagnosis Date Noted   Sepsis secondary to UTI ([place]) 07/24/2022   Diabetes mellitus type 2 in obese ([place]) 07/24/2022   Class 1 obesity due to excess calories with body mass index (BMI) of 34.0 to 34.9 in adult 07/24/2022   Pap smear of cervix shows high risk HPV present 11/15/2020   Influenza vaccine refused 07/16/2020   Hyperlipidemia associated with type 2 diabetes mellitus ([place]) 06/09/2020   Statins contraindicated 06/09/2020   Microalbuminuria due to type 2 diabetes mellitus ([place]) 06/09/2020   Type 2 diabetes mellitus with obesity ([place]) 06/04/2020   Polyhydramnios, antepartum complication 50/35/4656   Polyhydramnios in third trimester 08/13/2019   Unstable lie of fetus 08/06/2019   Obesity (BMI 30.0-34.9) 05/07/2019   Transient hypertension of pregnancy 05/07/2019   Language barrier 04/26/2015   H/O herpes simplex type 2 infection 04/26/2015   Previous cesarean section 04/26/2015   Obesity in pregnancy 04/26/2015   Anemia 04/26/2015     Current Outpatient Medications on File Prior to Visit  Medication Sig Dispense Refill   atorvastatin (LIPITOR) 10 MG tablet Take 1 tablet (10 mg total) by mouth daily. 90 tablet 1   fluticasone (FLONASE) 50 MCG/ACT nasal spray Place 2 sprays into both nostrils daily. 16 g 6   glucose blood (TRUE METRIX BLOOD GLUCOSE TEST) test strip Use  to check blood sugar up to 3 times daily. 100 each 2   TRUEplus Lancets 28G MISC Use to check blood sugar up to 3 times daily. 100 each 2   [DISCONTINUED] metFORMIN (GLUCOPHAGE) 1000 MG tablet Take 1 tablet (1,000 mg total) by mouth 2 (two) times daily with a meal. 60 tablet 6   No current facility-administered medications on file prior to visit.    Allergies  Allergen Reactions   Keflex [Cephalexin]     Chest tightness and SOB when prescribed 01/04/22 for strep throat   Metformin And Related     Diarrhea    Social History   Socioeconomic  History   Marital status: Married    Spouse name: Radio producer   Number of children: 4   Years of education: Not on file   Highest education level: 6th grade  Occupational History    Comment: unemployed  Tobacco Use   Smoking status: Never    Passive exposure: Past (co-wokers 10 years ago)   Smokeless tobacco: Never  Vaping Use   Vaping Use: Never used  Substance and Sexual Activity   Alcohol use: No   Drug use: Never   Sexual activity: Yes    Birth control/protection: Condom, I.U.D.  Other Topics Concern   Not on file  Social History Narrative   ** Merged History Encounter **       Social Determinants of Health   Financial Resource Strain: Not on file  Food Insecurity: No Food Insecurity (03/09/2022)   Hunger Vital Sign    Worried About Running Out of Food in the Last Year: Never true    Ran Out of Food in the Last Year: Never true  Transportation Needs: No Transportation Needs (03/09/2022)   PRAPARE - Hydrologist (Medical): No    Lack of Transportation (Non-Medical): No  Physical Activity: Not on file  Stress: Not on file  Social Connections: Not on file  Intimate Partner Violence: Not At Risk (03/11/2019)   Humiliation, Afraid, Rape, and Kick questionnaire    Fear of Current or Ex-Partner: No    Emotionally Abused: No    Physically Abused: No    Sexually Abused: No    Family History  Problem Relation Age of Onset   Diabetes Mother        oral agents   Hypertension Mother    Hypertension Father    Hypertension Brother    Breast cancer Cousin    Anesthesia problems Neg Hx    Hypotension Neg Hx    Malignant hyperthermia Neg Hx    Pseudochol deficiency Neg Hx    Colon cancer Neg Hx    Colon polyps Neg Hx    Crohn's disease Neg Hx    Esophageal cancer Neg Hx    Stomach cancer Neg Hx    Rectal cancer Neg Hx    Ulcerative colitis Neg Hx     Past Surgical History:  Procedure Laterality Date   CESAREAN SECTION  05/21/2011    Procedure: CESAREAN SECTION;  Surgeon: Jonnie Kind, MD;  Location: [facility];  Service: Gynecology;  Laterality: N/A;  Primary cesarean section with delivery of baby girl at 38. Apgars 9/9.   HERPES SIMPLEX VIRUS DFA     last outbreak prior to 2017    ROS: Review of Systems Negative except as stated above  PHYSICAL EXAM: BP 125/83 (BP Location: Left Arm, Patient Position: Sitting, Cuff Size: Normal)   Pulse 85   Temp  98.5 F (36.9 C) (Oral)   Ht '5\' 5"'$  (1.651 m)   Wt 203 lb (92.1 kg)   SpO2 98%   BMI 33.78 kg/m   Physical Exam  General appearance - alert, well appearing, and in no distress Mental status - normal mood, behavior, speech, dress, motor activity, and thought processes Neck - supple, no significant adenopathy Chest - clear to auscultation, no wheezes, rales or rhonchi, symmetric air entry Heart - normal rate, regular rhythm, normal S1, S2, no murmurs, rubs, clicks or gallops Extremities - peripheral pulses normal, no pedal edema, no clubbing or cyanosis      Latest Ref Rng & Units 07/26/2022    3:00 AM 07/25/2022    2:29 AM 07/23/2022    6:36 PM  CMP  Glucose 70 - 99 mg/dL 237  286  218   BUN 6 - 20 mg/dL '10  10  10   '$ Creatinine 0.44 - 1.00 mg/dL 0.62  0.69  0.66   Sodium 135 - 145 mmol/L 135  135  135   Potassium 3.5 - 5.1 mmol/L 3.6  3.8  3.4   Chloride 98 - 111 mmol/L 102  103  96   CO2 22 - 32 mmol/L '25  24  25   '$ Calcium 8.9 - 10.3 mg/dL 9.0  8.3  9.6   Total Protein 6.5 - 8.1 g/dL 7.0     Total Bilirubin 0.3 - 1.2 mg/dL 0.3     Alkaline Phos 38 - 126 U/L 96     AST 15 - 41 U/L 16     ALT 0 - 44 U/L 21      Lipid Panel     Component Value Date/Time   CHOL 169 09/27/2021 1047   TRIG 119 09/27/2021 1047   HDL 58 09/27/2021 1047   CHOLHDL 2.9 09/27/2021 1047   LDLCALC 90 09/27/2021 1047    CBC    Component Value Date/Time   WBC 5.5 07/26/2022 0300   RBC 4.58 07/26/2022 0300   HGB 10.9 (L) 07/26/2022 0300   HGB 11.1 05/17/2022 0912   HGB  9.3 04/02/2015 0000   HCT 32.8 (L) 07/26/2022 0300   HCT 35.6 05/17/2022 0912   HCT 32 04/02/2015 0000   PLT 260 07/26/2022 0300   PLT 333 05/17/2022 0912   PLT 334 04/02/2015 0000   MCV 71.6 (L) 07/26/2022 0300   MCV 75 (L) 05/17/2022 0912   MCH 23.8 (L) 07/26/2022 0300   MCHC 33.2 07/26/2022 0300   RDW 14.6 07/26/2022 0300   RDW 13.3 05/17/2022 0912   LYMPHSABS 1.0 07/23/2022 1836   LYMPHSABS 1.7 05/17/2022 0912   MONOABS 0.2 07/23/2022 1836   EOSABS 0.1 07/23/2022 1836   EOSABS 0.2 05/17/2022 0912   BASOSABS 0.1 07/23/2022 1836   BASOSABS 0.0 05/17/2022 0912    ASSESSMENT AND PLAN:  1. Type 2 diabetes mellitus with obesity ([place]) Most recent A1c close to goal at 7.5. Discussed and encourage healthy eating habits. Continue Amaryl 4 mg daily. Add Januvia 50 mg daily. She has stopped taking Jardiance due to cost.  I think it is reasonable to keep her off of it given recurrent vaginal yeast infections - POCT glucose (manual entry) - fluconazole (DIFLUCAN) 150 MG tablet; Take 1 tablet (150 mg total) by mouth daily.  Dispense: 2 tablet; Refill: 0 - glimepiride (AMARYL) 2 MG tablet; Take 2 tablets (4 mg total) by mouth daily.  Dispense: 60 tablet; Refill: 3 - sitaGLIPtin (JANUVIA) 50 MG tablet; Take  1 tablet (50 mg total) by mouth daily.  Dispense: 30 tablet; Refill: 6  2. Vaginal discharge Treat empirically with Diflucan for 2 days. - Cervicovaginal ancillary only  3. Iron deficiency Likely due to heavy menstrual cycles.  Prescription for iron supplement sent to the pharmacy. - Iron, Ferrous Sulfate, 325 (65 Fe) MG TABS; Take 1 tablet by mouth every Monday, Wednesday, and Friday.  Dispense: 100 tablet; Refill: 1 - CBC  4. Influenza vaccination declined   [employer] Language interpreter used during this encounter. #[id number] Lull  Patient was given the opportunity to ask questions.  Patient verbalized understanding of the plan and was able to repeat key elements of the plan.    This documentation was completed using Radio producer.  Any transcriptional errors are unintentional.  Orders Placed This Encounter  Procedures   CBC   POCT glucose (manual entry)     Requested Prescriptions   Signed Prescriptions Disp Refills   fluconazole (DIFLUCAN) 150 MG tablet 2 tablet 0    Sig: Take 1 tablet (150 mg total) by mouth daily.   glimepiride (AMARYL) 2 MG tablet 60 tablet 3    Sig: Take 2 tablets (4 mg total) by mouth daily.   Iron, Ferrous Sulfate, 325 (65 Fe) MG TABS 100 tablet 1    Sig: Take 1 tablet by mouth every Monday, Wednesday, and Friday.   sitaGLIPtin (JANUVIA) 50 MG tablet 30 tablet 6    Sig: Take 1 tablet (50 mg total) by mouth daily.    Return in about 4 months (around 12/20/2022).  Karle Plumber, MD, FACP

## 2022-08-22 ENCOUNTER — Other Ambulatory Visit: Payer: Self-pay | Admitting: Internal Medicine

## 2022-08-22 LAB — CBC
Hematocrit: 37.3 % (ref 34.0–46.6)
Hemoglobin: 11.7 g/dL (ref 11.1–15.9)
MCH: 23.1 pg — ABNORMAL LOW (ref 26.6–33.0)
MCHC: 31.4 g/dL — ABNORMAL LOW (ref 31.5–35.7)
MCV: 74 fL — ABNORMAL LOW (ref 79–97)
Platelets: 263 10*3/uL (ref 150–450)
RBC: 5.06 x10E6/uL (ref 3.77–5.28)
RDW: 14.8 % (ref 11.7–15.4)
WBC: 6.1 10*3/uL (ref 3.4–10.8)

## 2022-08-22 LAB — CERVICOVAGINAL ANCILLARY ONLY
Bacterial Vaginitis (gardnerella): POSITIVE — AB
Candida Glabrata: NEGATIVE
Candida Vaginitis: NEGATIVE
Chlamydia: NEGATIVE
Comment: NEGATIVE
Comment: NEGATIVE
Comment: NEGATIVE
Comment: NEGATIVE
Comment: NEGATIVE
Comment: NORMAL
Neisseria Gonorrhea: NEGATIVE
Trichomonas: NEGATIVE

## 2022-08-22 MED ORDER — METRONIDAZOLE 500 MG PO TABS
500.0000 mg | ORAL_TABLET | Freq: Two times a day (BID) | ORAL | 0 refills | Status: DC
  Filled 2022-08-22 – 2022-08-31 (×2): qty 14, 7d supply, fill #0

## 2022-08-23 ENCOUNTER — Other Ambulatory Visit: Payer: Self-pay

## 2022-08-30 ENCOUNTER — Other Ambulatory Visit: Payer: Self-pay

## 2022-08-31 ENCOUNTER — Other Ambulatory Visit: Payer: Self-pay

## 2022-09-01 ENCOUNTER — Other Ambulatory Visit: Payer: Self-pay

## 2022-10-25 ENCOUNTER — Other Ambulatory Visit: Payer: Self-pay

## 2022-10-26 ENCOUNTER — Other Ambulatory Visit: Payer: Self-pay

## 2022-10-31 ENCOUNTER — Other Ambulatory Visit: Payer: Self-pay

## 2022-11-27 ENCOUNTER — Other Ambulatory Visit: Payer: Self-pay

## 2022-12-01 ENCOUNTER — Other Ambulatory Visit: Payer: Self-pay

## 2022-12-19 ENCOUNTER — Other Ambulatory Visit: Payer: Self-pay

## 2022-12-20 ENCOUNTER — Other Ambulatory Visit: Payer: Self-pay

## 2022-12-22 ENCOUNTER — Ambulatory Visit: Payer: Self-pay | Attending: Internal Medicine | Admitting: Internal Medicine

## 2022-12-22 ENCOUNTER — Other Ambulatory Visit: Payer: Self-pay

## 2022-12-22 ENCOUNTER — Encounter: Payer: Self-pay | Admitting: Internal Medicine

## 2022-12-22 VITALS — BP 119/82 | HR 86 | Temp 98.6°F | Ht 65.0 in | Wt 200.0 lb

## 2022-12-22 DIAGNOSIS — E669 Obesity, unspecified: Secondary | ICD-10-CM

## 2022-12-22 DIAGNOSIS — E785 Hyperlipidemia, unspecified: Secondary | ICD-10-CM

## 2022-12-22 DIAGNOSIS — E1169 Type 2 diabetes mellitus with other specified complication: Secondary | ICD-10-CM

## 2022-12-22 DIAGNOSIS — Z6833 Body mass index (BMI) 33.0-33.9, adult: Secondary | ICD-10-CM

## 2022-12-22 DIAGNOSIS — M79651 Pain in right thigh: Secondary | ICD-10-CM

## 2022-12-22 DIAGNOSIS — J069 Acute upper respiratory infection, unspecified: Secondary | ICD-10-CM

## 2022-12-22 DIAGNOSIS — Z7984 Long term (current) use of oral hypoglycemic drugs: Secondary | ICD-10-CM

## 2022-12-22 LAB — POCT GLYCOSYLATED HEMOGLOBIN (HGB A1C): HbA1c, POC (controlled diabetic range): 9.8 % — AB (ref 0.0–7.0)

## 2022-12-22 LAB — GLUCOSE, POCT (MANUAL RESULT ENTRY): POC Glucose: 256 mg/dl — AB (ref 70–99)

## 2022-12-22 MED ORDER — LORATADINE 10 MG PO TABS
10.0000 mg | ORAL_TABLET | Freq: Every day | ORAL | 0 refills | Status: DC
  Filled 2022-12-22: qty 15, 15d supply, fill #0

## 2022-12-22 MED ORDER — SITAGLIPTIN PHOSPHATE 50 MG PO TABS
50.0000 mg | ORAL_TABLET | Freq: Every day | ORAL | 6 refills | Status: DC
  Filled 2022-12-22 – 2023-02-18 (×3): qty 30, 30d supply, fill #0

## 2022-12-22 MED ORDER — BENZONATATE 100 MG PO CAPS
100.0000 mg | ORAL_CAPSULE | Freq: Two times a day (BID) | ORAL | 0 refills | Status: DC | PRN
  Filled 2022-12-22: qty 20, 10d supply, fill #0

## 2022-12-22 NOTE — Progress Notes (Signed)
Patient ID: Morgan Ramsey, female    DOB: Jul 23, 1976  MRN: [id number]  CC: Diabetes (DM f/u./Pt informed that sitagliptin has a $600 copay - has not completed financial assistance forms)   Subjective: Morgan Ramsey is a 47 y.o. 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 #Isabel [id number]   DM/Obesity: Results for orders placed or performed in visit on 12/22/22  POCT glucose (manual entry)  Result Value Ref Range   POC Glucose 256 (A) 70 - 99 mg/dl  POCT glycosylated hemoglobin (Hb A1C)  Result Value Ref Range   Hemoglobin A1C     HbA1c POC (<> result, manual entry)     HbA1c, POC (prediabetic range)     HbA1c, POC (controlled diabetic range) 9.8 (A) 0.0 - 7.0 %  On last visit, we changed Jardiance to Januvia due to the cost of the [place].  We kept her on Amaryl 4 mg daily.  Unable to get Januvia due to cost of $600.  Cost was $600 per pt.  Tells me she did not have the letter from her spouse's employer to qualify for PAP but has it today with her A1c has increased from 7.5 on last visit. -out of Amaryl for 2 wks because she was not able to get to the pharmacy to pick up refill.  Just pick up 2 days ago.  Checks BS BID - range in 200s for the past 2 wks while out of Amaryl.  Prior to running out range 140-160 before meals.  Over due for eye exam.  No insurance  HL: Taking and tolerating atorvastatin 10 mg daily.  Last LDL was 90.  C/o having cough and sore throat x 2 wks.  Cough productive of green phlegm.  + congestion nasal Subjective fever 2x last wk Everyone in house was sick around the same time except her husband -Using Flonase nasal spray which helps a little  Over 1 wk ago.  She had pain on RT leg that interfered with her walking.  She points to the lateral thigh.  No numbness or tingling in  the area.  No significant lower back pain. Hx of varicose veins.  Thinks this is the issue. Little tingle over shin at this time   Patient Active Problem List   Diagnosis Date Noted   Sepsis secondary to UTI (HCC) 07/24/2022   Diabetes mellitus type 2 in obese 07/24/2022   Class 1 obesity due to excess calories with body mass index (BMI) of 34.0 to 34.9 in adult 07/24/2022   Pap smear of cervix shows high risk HPV present 11/15/2020   Influenza vaccine refused 07/16/2020   Hyperlipidemia associated with type 2 diabetes mellitus (HCC) 06/09/2020   Statins contraindicated 06/09/2020   Microalbuminuria due to type 2 diabetes mellitus (HCC) 06/09/2020   Type 2 diabetes mellitus with obesity (HCC) 06/04/2020   Polyhydramnios, antepartum complication 08/17/2019   Polyhydramnios in third trimester 08/13/2019   Unstable lie of fetus 08/06/2019   Obesity (BMI 30.0-34.9) 05/07/2019   Transient hypertension of pregnancy 05/07/2019   Language barrier 04/26/2015   H/O herpes simplex type 2 infection 04/26/2015   Previous cesarean section 04/26/2015   Obesity in pregnancy 04/26/2015   Anemia 04/26/2015     Current Outpatient Medications on File Prior to Visit  Medication Sig Dispense Refill   atorvastatin (LIPITOR) 10 MG tablet Take 1  tablet (10 mg total) by mouth daily. 90 tablet 1   glimepiride (AMARYL) 2 MG tablet Take 2 tablets (4 mg total) by mouth daily. 60 tablet 3   glucose blood (TRUE METRIX BLOOD GLUCOSE TEST) test strip Use to check blood sugar up to 3 times daily. 100 each 2   Iron, Ferrous Sulfate, 325 (65 Fe) MG TABS Take 1 tablet by mouth every Monday, Wednesday, and Friday. 100 tablet 1   TRUEplus Lancets 28G MISC Use to check blood sugar up to 3 times daily. 100 each 2   [DISCONTINUED] metFORMIN (GLUCOPHAGE) 1000 MG tablet Take 1 tablet (1,000 mg total) by mouth 2 (two) times daily with a meal. 60 tablet 6   No current facility-administered medications on file prior to visit.     Allergies  Allergen Reactions   Keflex [Cephalexin]     Chest tightness and SOB when prescribed 01/04/22 for strep throat   Metformin And Related     Diarrhea    Social History   Socioeconomic History   Marital status: Married    Spouse name: Museum/gallery conservator   Number of children: 4   Years of education: Not on file   Highest education level: 6th grade  Occupational History    Comment: unemployed  Tobacco Use   Smoking status: Never    Passive exposure: Past (co-wokers 10 years ago)   Smokeless tobacco: Never  Vaping Use   Vaping Use: Never used  Substance and Sexual Activity   Alcohol use: No   Drug use: Never   Sexual activity: Yes    Birth control/protection: Condom, I.U.D.  Other Topics Concern   Not on file  Social History Narrative   ** Merged History Encounter **       Social Determinants of Health   Financial Resource Strain: Low Risk  (12/18/2022)   Overall Financial Resource Strain (CARDIA)    Difficulty of Paying Living Expenses: Not hard at all  Food Insecurity: No Food Insecurity (12/18/2022)   Hunger Vital Sign    Worried About Running Out of Food in the Last Year: Never true    Ran Out of Food in the Last Year: Never true  Transportation Needs: No Transportation Needs (12/18/2022)   PRAPARE - Administrator, Civil Service (Medical): No    Lack of Transportation (Non-Medical): No  Physical Activity: Insufficiently Active (12/18/2022)   Exercise Vital Sign    Days of Exercise per Week: 3 days    Minutes of Exercise per Session: 30 min  Stress: No Stress Concern Present (12/18/2022)   [employer] of Occupational Health - Occupational Stress Questionnaire    Feeling of Stress : Not at all  Social Connections: Moderately Integrated (12/18/2022)   Social Connection and Isolation Panel [NHANES]    Frequency of Communication with Friends and Family: Once a week    Frequency of Social Gatherings with Friends and Family: Three times a week     Attends Religious Services: More than 4 times per year    Active Member of Clubs or Organizations: No    Attends Banker Meetings: Not on file    Marital Status: Married  Intimate Partner Violence: Not At Risk (03/11/2019)   Humiliation, Afraid, Rape, and Kick questionnaire    Fear of Current or Ex-Partner: No    Emotionally Abused: No    Physically Abused: No    Sexually Abused: No    Family History  Problem Relation Age of Onset  Diabetes Mother        oral agents   Hypertension Mother    Hypertension Father    Hypertension Brother    Breast cancer Cousin    Anesthesia problems Neg Hx    Hypotension Neg Hx    Malignant hyperthermia Neg Hx    Pseudochol deficiency Neg Hx    Colon cancer Neg Hx    Colon polyps Neg Hx    Crohn's disease Neg Hx    Esophageal cancer Neg Hx    Stomach cancer Neg Hx    Rectal cancer Neg Hx    Ulcerative colitis Neg Hx     Past Surgical History:  Procedure Laterality Date   CESAREAN SECTION  05/21/2011   Procedure: CESAREAN SECTION;  Surgeon: Tilda Burrow, MD;  Location: [facility];  Service: Gynecology;  Laterality: N/A;  Primary cesarean section with delivery of baby girl at 36. Apgars 9/9.   HERPES SIMPLEX VIRUS DFA     last outbreak prior to 2017    ROS: Review of Systems Negative except as stated above  PHYSICAL EXAM: BP 119/82 (BP Location: Left Arm, Patient Position: Sitting, Cuff Size: Normal)   Pulse 86   Temp 98.6 F (37 C) (Oral)   Ht 5\' 5"  (1.651 m)   Wt 200 lb (90.7 kg)   SpO2 98%   BMI 33.28 kg/m   Wt Readings from Last 3 Encounters:  12/22/22 200 lb (90.7 kg)  08/21/22 203 lb (92.1 kg)  07/23/22 209 lb (94.8 kg)    Physical Exam  General appearance - alert, well appearing, middle-aged Hispanic female and in no distress.  She has audible mild nasal congestion. Mental status - normal mood, behavior, speech, dress, motor activity, and thought processes Nose - mild enlargement turbunates Mouth -  mucous membranes moist, pharynx normal without lesions Neck - supple, no significant adenopathy Chest - clear to auscultation, no wheezes, rales or rhonchi, symmetric air entry Heart - normal rate, regular rhythm, normal S1, S2, no murmurs, rubs, clicks or gallops Musculoskeletal - RT leg:  no tenderness over trochanteric burse Extremities - peripheral pulses normal, no pedal edema, no clubbing or cyanosis.  Mild non-inflammed spider veins on lateral thighs and lower legs.  Legs warm to touch.  Good pulses in both legs Diabetic Foot Exam - Simple   Simple Foot Form Diabetic Foot exam was performed with the following findings: Yes 12/22/2022  9:55 AM  Visual Inspection No deformities, no ulcerations, no other skin breakdown bilaterally: Yes Sensation Testing Intact to touch and monofilament testing bilaterally: Yes Pulse Check Posterior Tibialis and Dorsalis pulse intact bilaterally: Yes Comments         Latest Ref Rng & Units 07/26/2022    3:00 AM 07/25/2022    2:29 AM 07/23/2022    6:36 PM  CMP  Glucose 70 - 99 mg/dL 161  096  045   BUN 6 - 20 mg/dL 10  10  10    Creatinine 0.44 - 1.00 mg/dL 4.09  8.11  9.14   Sodium 135 - 145 mmol/L 135  135  135   Potassium 3.5 - 5.1 mmol/L 3.6  3.8  3.4   Chloride 98 - 111 mmol/L 102  103  96   CO2 22 - 32 mmol/L 25  24  25    Calcium 8.9 - 10.3 mg/dL 9.0  8.3  9.6   Total Protein 6.5 - 8.1 g/dL 7.0     Total Bilirubin 0.3 - 1.2 mg/dL 0.3  Alkaline Phos 38 - 126 U/L 96     AST 15 - 41 U/L 16     ALT 0 - 44 U/L 21      Lipid Panel     Component Value Date/Time   CHOL 169 09/27/2021 1047   TRIG 119 09/27/2021 1047   HDL 58 09/27/2021 1047   CHOLHDL 2.9 09/27/2021 1047   LDLCALC 90 09/27/2021 1047    CBC    Component Value Date/Time   WBC 6.1 08/21/2022 1054   WBC 5.5 07/26/2022 0300   RBC 5.06 08/21/2022 1054   RBC 4.58 07/26/2022 0300   HGB 11.7 08/21/2022 1054   HGB 9.3 04/02/2015 0000   HCT 37.3 08/21/2022 1054   HCT  32 04/02/2015 0000   PLT 263 08/21/2022 1054   PLT 334 04/02/2015 0000   MCV 74 (L) 08/21/2022 1054   MCH 23.1 (L) 08/21/2022 1054   MCH 23.8 (L) 07/26/2022 0300   MCHC 31.4 (L) 08/21/2022 1054   MCHC 33.2 07/26/2022 0300   RDW 14.8 08/21/2022 1054   LYMPHSABS 1.0 07/23/2022 1836   LYMPHSABS 1.7 05/17/2022 0912   MONOABS 0.2 07/23/2022 1836   EOSABS 0.1 07/23/2022 1836   EOSABS 0.2 05/17/2022 0912   BASOSABS 0.1 07/23/2022 1836   BASOSABS 0.0 05/17/2022 0912    ASSESSMENT AND PLAN:  1. Type 2 diabetes mellitus with obesity (HCC) Not at goal. She has been off Amaryl for 2 weeks due to lack of transportation to get to the pharmacy but was finally able to get it 2 days ago.  Did not qualify for Januvia through patient assistance as she needed documentation of family income.  Patient states she has that information with her today and will give it to the pharmacy so that she can be signed up for the program -Encourage healthy eating habits.  - POCT glucose (manual entry) - POCT glycosylated hemoglobin (Hb A1C) - Microalbumin / creatinine urine ratio - sitaGLIPtin (JANUVIA) 50 MG tablet; Take 1 tablet (50 mg total) by mouth daily.  Dispense: 30 tablet; Refill: 6  2. Hyperlipidemia associated with type 2 diabetes mellitus (HCC) Continue atorvastatin 10 mg daily.  3. URI with cough and congestion We will not screen for COVID or flu as she is beyond the window where antiviral medication would be prescribed.  Will treat symptomatically. - benzonatate (TESSALON) 100 MG capsule; Take 1 capsule (100 mg total) by mouth 2 (two) times daily as needed for cough.  Dispense: 20 capsule; Refill: 0 - loratadine (CLARITIN) 10 MG tablet; Take 1 tablet (10 mg total) by mouth daily.  Dispense: 15 tablet; Refill: 0  4. Pain of right thigh I think this is more musculoskeletal than from varicose veins.  She has a few scattered spider varicose veins that do not appear to be inflamed at this time.  I  recommend using over-the-counter Tylenol.    Patient was given the opportunity to ask questions.  Patient verbalized understanding of the plan and was able to repeat key elements of the plan.   This documentation was completed using Paediatric nurse.  Any transcriptional errors are unintentional.  Orders Placed This Encounter  Procedures   Microalbumin / creatinine urine ratio   POCT glucose (manual entry)   POCT glycosylated hemoglobin (Hb A1C)     Requested Prescriptions   Signed Prescriptions Disp Refills   benzonatate (TESSALON) 100 MG capsule 20 capsule 0    Sig: Take 1 capsule (100 mg total) by mouth 2 (  two) times daily as needed for cough.   loratadine (CLARITIN) 10 MG tablet 15 tablet 0    Sig: Take 1 tablet (10 mg total) by mouth daily.   sitaGLIPtin (JANUVIA) 50 MG tablet 30 tablet 6    Sig: Take 1 tablet (50 mg total) by mouth daily.    Return in about 4 months (around 04/24/2023).  Jonah Blue, MD, FACP

## 2022-12-23 LAB — MICROALBUMIN / CREATININE URINE RATIO
Creatinine, Urine: 36.5 mg/dL
Microalb/Creat Ratio: 109 mg/g creat — ABNORMAL HIGH (ref 0–29)
Microalbumin, Urine: 39.7 ug/mL

## 2022-12-25 ENCOUNTER — Other Ambulatory Visit: Payer: Self-pay

## 2023-01-01 ENCOUNTER — Ambulatory Visit: Payer: Self-pay | Admitting: *Deleted

## 2023-01-01 NOTE — Telephone Encounter (Signed)
Reason for Disposition  [1] Follow-up call to recent contact AND [2] information only call, no triage required  Answer Assessment - Initial Assessment Questions 1. REASON FOR CALL or QUESTION: "What is your reason for calling today?" or "How can I best help you?" or "What question do you have that I can help answer?"     Pt returned call and was given her lab results  Protocols used: Information Only Call - No Triage-A-AH

## 2023-01-01 NOTE — Telephone Encounter (Signed)
  Chief Complaint: Pt given lab results per notes of Dr. Laural Benes on 12/23/2022 at 7:03 PM.   Pt verbalized understanding.  Symptoms: N/a Frequency: N/A Pertinent Negatives: Patient denies N/A Disposition: [] ED /[] Urgent Care (no appt availability in office) / [] Appointment(In office/virtual)/ []  [facility]/ [x] Home Care/ [] Refused Recommended Disposition /[] [facility]/ []  Follow-up with PCP Additional Notes: She called in with Spanish interpreter Mikeal Hawthorne [phone].

## 2023-01-10 ENCOUNTER — Other Ambulatory Visit: Payer: Self-pay

## 2023-01-10 DIAGNOSIS — Z1231 Encounter for screening mammogram for malignant neoplasm of breast: Secondary | ICD-10-CM

## 2023-01-18 ENCOUNTER — Other Ambulatory Visit: Payer: Self-pay

## 2023-01-22 ENCOUNTER — Other Ambulatory Visit: Payer: Self-pay

## 2023-01-23 ENCOUNTER — Ambulatory Visit: Payer: Self-pay | Admitting: Physician Assistant

## 2023-01-23 ENCOUNTER — Other Ambulatory Visit: Payer: Self-pay

## 2023-01-23 ENCOUNTER — Encounter: Payer: Self-pay | Admitting: Physician Assistant

## 2023-01-23 VITALS — BP 135/83 | HR 83 | Ht 65.0 in | Wt 204.0 lb

## 2023-01-23 DIAGNOSIS — N3000 Acute cystitis without hematuria: Secondary | ICD-10-CM

## 2023-01-23 DIAGNOSIS — L309 Dermatitis, unspecified: Secondary | ICD-10-CM

## 2023-01-23 LAB — POCT URINALYSIS DIP (CLINITEK)
Bilirubin, UA: NEGATIVE
Blood, UA: NEGATIVE
Glucose, UA: 500 mg/dL — AB
Ketones, POC UA: NEGATIVE mg/dL
Leukocytes, UA: NEGATIVE
Nitrite, UA: NEGATIVE
POC PROTEIN,UA: NEGATIVE
Spec Grav, UA: 1.02 (ref 1.010–1.025)
Urobilinogen, UA: 0.2 E.U./dL
pH, UA: 6 (ref 5.0–8.0)

## 2023-01-23 MED ORDER — NITROFURANTOIN MONOHYD MACRO 100 MG PO CAPS
100.0000 mg | ORAL_CAPSULE | Freq: Two times a day (BID) | ORAL | 0 refills | Status: AC
Start: 2023-01-23 — End: 2023-01-28
  Filled 2023-01-23: qty 10, 5d supply, fill #0

## 2023-01-23 MED ORDER — NYSTATIN-TRIAMCINOLONE 100000-0.1 UNIT/GM-% EX OINT
1.0000 | TOPICAL_OINTMENT | Freq: Two times a day (BID) | CUTANEOUS | 0 refills | Status: DC
Start: 2023-01-23 — End: 2024-02-26
  Filled 2023-01-23: qty 30, 15d supply, fill #0

## 2023-01-23 NOTE — Patient Instructions (Signed)
To help with your urinary tract infection, you are going to take Macrobid twice a day for 5 days.  I strongly encourage you to increase your water intake, you should be drinking at least 64 ounces of water a day.  To help with your rash, you are going to use Mycolog twice a day on the affected areas.  I encourage you to keep the areas clean and dry.  We will call you with today's lab results.  Please let us know if there is anything else we can do for you  Roney Jaffe, PA-C Physician Assistant [facility] [email or web address]   Tinea Versicolor  Tinea versicolor is a common fungal infection. It causes a rash that looks like light or dark patches on the skin. The rash most often occurs on the chest, back, neck, or upper arms. This condition is more common during warm weather. Tinea versicolor usually does not cause any other problems than the rash. In most cases, the infection goes away in a few weeks with treatment. It may take a few months for the patches on your skin to return to your usual skin color. What are the causes? This condition occurs when a certain type of fungus (Malassezia furfur) that is normally present on the skin starts to grow too much. This fungus is a type of yeast. This condition cannot be passed from one person to another (is not contagious). What increases the risk? This condition is more likely to develop when certain factors are present, such as: Heat and humidity. Sweating too much. Hormone changes, such as those that occur when taking birth control pills. Oily skin. A weak disease-fighting system (immunesystem). What are the signs or symptoms? Symptoms of this condition include: A rash of light or dark patches on your skin. The rash may have: Patches of tan or pink spots (on light skin). Patches of white or brown spots (on dark skin). Patches of skin that do not tan. Well-marked edges. Scales on  the discolored areas. Mild itching. There may also be no itching. How is this diagnosed? A health care provider can usually diagnose this condition by looking at your skin. During the exam, he or she may use ultraviolet (UV) light to see how much of your skin has been affected. In some cases, a skin sample may be taken by scraping the rash. This sample will be viewed under a microscope to check for yeast overgrowth. How is this treated? Treatment for this condition may include: Dandruff shampoo that is applied to the affected skin during showers or bathing. Over-the-counter medicated skin cream, lotion, or soaps. Prescription antifungal medicine in the form of skin cream or pills. Medicine to help reduce itching. Follow these instructions at home: Use over-the-counter and prescription medicines only as told by your health care provider. Apply dandruff shampoo to the affected area as told by your health care provider. Do not scratch the affected area of skin. Avoid hot and humid conditions. Do not use tanning booths. Try to avoid sweating a lot. Contact a health care provider if: Your symptoms get worse. You have a fever. You have signs of infection such as: Redness, swelling, or pain at the site of your rash. Warmth coming from your rash. Fluid or blood coming from your rash. Pus or a bad smell coming from your rash. Your rash comes back (recurs) after treatment. Your rash does not improve with treatment and spreads to other parts of the body. Summary Tinea versicolor is a  common fungal infection of the skin. It causes a rash that looks like light or dark patches on the skin. The rash most often occurs on the chest, back, neck, or upper arms. A health care provider can usually diagnose this condition by looking at your skin. Treatment may include applying shampoo to the skin and taking or applying medicines. This information is not intended to replace advice given to you by your  health care provider. Make sure you discuss any questions you have with your health care provider. Document Revised: 10/19/2020 Document Reviewed: 10/19/2020 Elsevier Patient Education  2024 [employer].

## 2023-01-23 NOTE — Progress Notes (Signed)
Established Patient Office Visit  Subjective   Patient ID: Morgan Ramsey, female    DOB: 12-13-1975  Age: 47 y.o. MRN: [id number]  Chief Complaint  Patient presents with   Back Pain   Urinary Frequency    Symptoms started on Saturday, vaginal itching and irritation.    Dermatitis    On arms and breast.     States that she has been experiencing dysuria and suprapubic discomfort for the last 4 days.  States that she started having bilateral lower back pain yesterday.  States that she also had slight vaginal itching which has since resolved, denies any vaginal discharge.  Denies nausea, vomiting or fever.  States that she has not tried anything for relief, states that she drinks "some water every day but is unsure how much she is actually drinking".  Does endorse significant history of hospitalization for pyelonephritis in December 2023.  States that this feels similar pain wise, but states that her urine at that time was very foamy and that is not foamy now.  Also complains of itchy rash on the inner folds of her elbows and under both breasts.  States that this has been ongoing for the past 5 months.  Denies any new detergents, medications, lotions, fragrances, body washes.  States no one in the house has similar rash.  States that she has not tried anything for relief.    Due to language barrier, an interpreter was present during the history-taking and subsequent discussion (and for part of the physical exam) with this patient.    Past Medical History:  Diagnosis Date   Allergy    pollen   AMA (advanced maternal age) multigravida 35+    Anemia    Diabetes mellitus without complication (HCC)    Gestational diabetes 2017   glyburide   Herpes    History of candidal vulvovaginitis    History of chlamydia 2008   Hyperlipidemia    Obese    Social History   Socioeconomic History   Marital status: Married    Spouse name: Museum/gallery conservator   Number of children: 4   Years  of education: Not on file   Highest education level: 6th grade  Occupational History    Comment: unemployed  Tobacco Use   Smoking status: Never    Passive exposure: Past (co-wokers 10 years ago)   Smokeless tobacco: Never  Vaping Use   Vaping Use: Never used  Substance and Sexual Activity   Alcohol use: No   Drug use: Never   Sexual activity: Yes    Birth control/protection: Condom, I.U.D.  Other Topics Concern   Not on file  Social History Narrative   ** Merged History Encounter **       Social Determinants of Health   Financial Resource Strain: Low Risk  (12/18/2022)   Overall Financial Resource Strain (CARDIA)    Difficulty of Paying Living Expenses: Not hard at all  Food Insecurity: No Food Insecurity (12/18/2022)   Hunger Vital Sign    Worried About Running Out of Food in the Last Year: Never true    Ran Out of Food in the Last Year: Never true  Transportation Needs: No Transportation Needs (12/18/2022)   PRAPARE - Administrator, Civil Service (Medical): No    Lack of Transportation (Non-Medical): No  Physical Activity: Insufficiently Active (12/18/2022)   Exercise Vital Sign    Days of Exercise per Week: 3 days    Minutes of Exercise per Session: 30  min  Stress: No Stress Concern Present (12/18/2022)   [employer] of Occupational Health - Occupational Stress Questionnaire    Feeling of Stress : Not at all  Social Connections: Moderately Integrated (12/18/2022)   Social Connection and Isolation Panel [NHANES]    Frequency of Communication with Friends and Family: Once a week    Frequency of Social Gatherings with Friends and Family: Three times a week    Attends Religious Services: More than 4 times per year    Active Member of Clubs or Organizations: No    Attends Banker Meetings: Not on file    Marital Status: Married  Catering manager Violence: Not At Risk (03/11/2019)   Humiliation, Afraid, Rape, and Kick questionnaire    Fear of Current  or Ex-Partner: No    Emotionally Abused: No    Physically Abused: No    Sexually Abused: No   Family History  Problem Relation Age of Onset   Diabetes Mother        oral agents   Hypertension Mother    Hypertension Father    Hypertension Brother    Breast cancer Cousin    Anesthesia problems Neg Hx    Hypotension Neg Hx    Malignant hyperthermia Neg Hx    Pseudochol deficiency Neg Hx    Colon cancer Neg Hx    Colon polyps Neg Hx    Crohn's disease Neg Hx    Esophageal cancer Neg Hx    Stomach cancer Neg Hx    Rectal cancer Neg Hx    Ulcerative colitis Neg Hx    Allergies  Allergen Reactions   Keflex [Cephalexin]     Chest tightness and SOB when prescribed 01/04/22 for strep throat   Metformin And Related     Diarrhea    Review of Systems  Constitutional:  Negative for chills and fever.  HENT: Negative.    Eyes: Negative.   Respiratory:  Negative for shortness of breath.   Cardiovascular:  Negative for chest pain.  Gastrointestinal:  Positive for abdominal pain. Negative for nausea and vomiting.  Genitourinary:  Positive for dysuria and frequency.  Musculoskeletal:  Positive for back pain.  Skin:  Positive for itching and rash.  Neurological: Negative.   Endo/Heme/Allergies: Negative.   Psychiatric/Behavioral: Negative.        Objective:     BP 135/83 (BP Location: Left Arm, Patient Position: Sitting, Cuff Size: Large)   Pulse 83   Ht 5\' 5"  (1.651 m)   Wt 204 lb (92.5 kg)   SpO2 99%   BMI 33.95 kg/m  BP Readings from Last 3 Encounters:  01/23/23 135/83  12/22/22 119/82  08/21/22 125/83   Wt Readings from Last 3 Encounters:  01/23/23 204 lb (92.5 kg)  12/22/22 200 lb (90.7 kg)  08/21/22 203 lb (92.1 kg)      Physical Exam Vitals and nursing note reviewed.  Constitutional:      Appearance: Normal appearance.  HENT:     Head: Normocephalic and atraumatic.     Right Ear: External ear normal.     Left Ear: External ear normal.     Nose: Nose  normal.     Mouth/Throat:     Mouth: Mucous membranes are moist.     Pharynx: Oropharynx is clear.  Eyes:     Extraocular Movements: Extraocular movements intact.     Conjunctiva/sclera: Conjunctivae normal.     Pupils: Pupils are equal, round, and reactive to light.  Cardiovascular:  Rate and Rhythm: Normal rate and regular rhythm.     Pulses: Normal pulses.     Heart sounds: Normal heart sounds.  Pulmonary:     Effort: Pulmonary effort is normal.     Breath sounds: Normal breath sounds.  Abdominal:     Tenderness: There is abdominal tenderness in the suprapubic area. There is right CVA tenderness and left CVA tenderness.  Musculoskeletal:        General: Normal range of motion.     Cervical back: Normal range of motion and neck supple.  Skin:    General: Skin is warm and dry.     Comments: Scattered patches of erythema with satellite lesions noted under both breast, inner folds of elbows.  Nonpustular  Neurological:     General: No focal deficit present.     Mental Status: She is alert and oriented to person, place, and time.  Psychiatric:        Mood and Affect: Mood normal.        Behavior: Behavior normal.        Thought Content: Thought content normal.        Judgment: Judgment normal.        Assessment & Plan:   Problem List Items Addressed This Visit   None Visit Diagnoses     Acute cystitis without hematuria    -  Primary   Relevant Medications   nitrofurantoin, macrocrystal-monohydrate, (MACROBID) 100 MG capsule   Dermatitis       Relevant Medications   nystatin-triamcinolone ointment (MYCOLOG)     1. Acute cystitis without hematuria UA was positive for glucose, however negative for nitrates or leuks.  However given patient's significant history of hospitalization in December 2023, will treat based on clinical presentation.  Trial Macrobid, patient was positive for E. coli on urine culture in December 2023 and Macrobid was sensitive.  Patient education  given on supportive care.  Red flags given for prompt reevaluation. - nitrofurantoin, macrocrystal-monohydrate, (MACROBID) 100 MG capsule; Take 1 capsule (100 mg total) by mouth 2 (two) times daily for 5 days.  Dispense: 10 capsule; Refill: 0 - POCT URINALYSIS DIP (CLINITEK) - Urine Culture  2. Dermatitis Trial Mycolog.  Patient education given on supportive care - nystatin-triamcinolone ointment (MYCOLOG); Apply 1 Application topically 2 (two) times daily.  Dispense: 30 g; Refill: 0   I have reviewed the patient's medical history (PMH, PSH, Social History, Family History, Medications, and allergies) , and have been updated if relevant. I spent 30 minutes reviewing chart and  face to face time with patient.     Return if symptoms worsen or fail to improve.    Kasandra Knudsen Mayers, PA-C

## 2023-01-25 LAB — URINE CULTURE: Organism ID, Bacteria: NO GROWTH

## 2023-02-18 ENCOUNTER — Other Ambulatory Visit: Payer: Self-pay | Admitting: Internal Medicine

## 2023-02-18 DIAGNOSIS — E1169 Type 2 diabetes mellitus with other specified complication: Secondary | ICD-10-CM

## 2023-02-19 ENCOUNTER — Other Ambulatory Visit: Payer: Self-pay

## 2023-02-19 MED ORDER — GLIMEPIRIDE 2 MG PO TABS
4.0000 mg | ORAL_TABLET | Freq: Every day | ORAL | 3 refills | Status: DC
Start: 2023-02-19 — End: 2023-07-30
  Filled 2023-02-19 – 2023-03-02 (×2): qty 60, 30d supply, fill #0
  Filled 2023-04-06: qty 60, 30d supply, fill #1
  Filled 2023-05-14: qty 60, 30d supply, fill #2
  Filled 2023-06-22: qty 60, 30d supply, fill #3

## 2023-02-19 MED ORDER — TRUE METRIX BLOOD GLUCOSE TEST VI STRP
ORAL_STRIP | 2 refills | Status: DC
Start: 2023-02-19 — End: 2023-08-24
  Filled 2023-02-19 – 2023-05-14 (×2): qty 100, 33d supply, fill #0

## 2023-02-23 ENCOUNTER — Other Ambulatory Visit: Payer: Self-pay

## 2023-02-27 ENCOUNTER — Telehealth: Payer: Self-pay | Admitting: Internal Medicine

## 2023-02-27 ENCOUNTER — Telehealth: Payer: Self-pay

## 2023-02-27 NOTE — Telephone Encounter (Signed)
Duplicate

## 2023-02-27 NOTE — Telephone Encounter (Signed)
Copied from CRM [id number]. Topic: General - Other >> Feb 27, 2023 12:55 PM Everette C wrote: Reason for CRM: The patient would like to be contacted by a member of staff to review their results from 12/23/22  Please contact the patient further when possible

## 2023-02-28 NOTE — Telephone Encounter (Signed)
Called & spoke to the patient. Verified name & DOB. Scheduled an appointment for 03/02/2023. Patient confirmed appointment.

## 2023-03-02 ENCOUNTER — Other Ambulatory Visit: Payer: Self-pay

## 2023-03-02 ENCOUNTER — Ambulatory Visit: Payer: Self-pay | Admitting: Internal Medicine

## 2023-03-08 ENCOUNTER — Other Ambulatory Visit (HOSPITAL_COMMUNITY)
Admission: RE | Admit: 2023-03-08 | Discharge: 2023-03-08 | Disposition: A | Discharge status: Home / Self Care | Payer: Self-pay | Source: Ambulatory Visit | Attending: Internal Medicine | Admitting: Internal Medicine

## 2023-03-08 ENCOUNTER — Ambulatory Visit: Payer: Self-pay | Attending: Internal Medicine | Admitting: Internal Medicine

## 2023-03-08 ENCOUNTER — Encounter: Payer: Self-pay | Admitting: Internal Medicine

## 2023-03-08 ENCOUNTER — Other Ambulatory Visit: Payer: Self-pay

## 2023-03-08 VITALS — BP 125/74 | HR 82 | Temp 98.9°F | Ht 65.0 in | Wt 203.0 lb

## 2023-03-08 DIAGNOSIS — E1129 Type 2 diabetes mellitus with other diabetic kidney complication: Secondary | ICD-10-CM

## 2023-03-08 DIAGNOSIS — N760 Acute vaginitis: Secondary | ICD-10-CM | POA: Insufficient documentation

## 2023-03-08 DIAGNOSIS — R809 Proteinuria, unspecified: Secondary | ICD-10-CM

## 2023-03-08 DIAGNOSIS — M546 Pain in thoracic spine: Secondary | ICD-10-CM

## 2023-03-08 LAB — POCT URINALYSIS DIP (CLINITEK)
Bilirubin, UA: NEGATIVE
Blood, UA: NEGATIVE
Glucose, UA: NEGATIVE mg/dL
Ketones, POC UA: NEGATIVE mg/dL
Leukocytes, UA: NEGATIVE
Nitrite, UA: NEGATIVE
Spec Grav, UA: 1.02 (ref 1.010–1.025)
Urobilinogen, UA: 0.2 E.U./dL
pH, UA: 6 (ref 5.0–8.0)

## 2023-03-08 MED ORDER — NAPROXEN 500 MG PO TABS
500.0000 mg | ORAL_TABLET | Freq: Two times a day (BID) | ORAL | 0 refills | Status: DC
Start: 2023-03-08 — End: 2023-04-24
  Filled 2023-03-08: qty 30, 15d supply, fill #0

## 2023-03-08 MED ORDER — CYCLOBENZAPRINE HCL 5 MG PO TABS
5.0000 mg | ORAL_TABLET | Freq: Two times a day (BID) | ORAL | 0 refills | Status: DC | PRN
Start: 2023-03-08 — End: 2023-04-24
  Filled 2023-03-08: qty 30, 15d supply, fill #0

## 2023-03-08 NOTE — Progress Notes (Signed)
Patient ID: Morgan Ramsey, female    DOB: October 01, 1975  MRN: [id number]  CC: Back Pain (Cramps, vaginal odor, itching, lower back pain X1 mo)   Subjective: Morgan Ramsey is a 47 y.o. female who presents for UC visit Her concerns today include:  DM with microalbumin, obesity, HL, HPV positive (s/p negative colpo 11/2020)   [employer] Language interpreter used during this encounter. #[id number], Maray  Pt c/o lower back pain associated with vaginal odor, itching and cramps x 1 mth.  Endorse vaginal dischg that is white/yellow in color.  Back pain is intermittent. Pain on both sides.  She points to paraspinal muscles extending from upper thoracic down to lumbar. No excessive lifting, push or pulling recently.  Back pain worse when bending to pick up objects off the floor.  -no dysuria. Reports having fever 1 mth ago.  No N/V Sexually active with her husband only.  Some dysparunia at times  Patient asks about the protein in the urine for microalbuminuria that was done on last visit.  This had increased some to 109 previously was 54.  Patient Active Problem List   Diagnosis Date Noted   Sepsis secondary to UTI (HCC) 07/24/2022   Diabetes mellitus type 2 in obese 07/24/2022   Class 1 obesity due to excess calories with body mass index (BMI) of 34.0 to 34.9 in adult 07/24/2022   Pap smear of cervix shows high risk HPV present 11/15/2020   Influenza vaccine refused 07/16/2020   Hyperlipidemia associated with type 2 diabetes mellitus (HCC) 06/09/2020   Statins contraindicated 06/09/2020   Microalbuminuria due to type 2 diabetes mellitus (HCC) 06/09/2020   Type 2 diabetes mellitus with obesity (HCC) 06/04/2020   Polyhydramnios, antepartum complication 08/17/2019   Polyhydramnios in third trimester 08/13/2019   Unstable lie of fetus 08/06/2019   Obesity (BMI 30.0-34.9) 05/07/2019   Transient hypertension of pregnancy 05/07/2019   Language barrier 04/26/2015   H/O herpes simplex type 2  infection 04/26/2015   Previous cesarean section 04/26/2015   Obesity in pregnancy 04/26/2015   Anemia 04/26/2015     Current Outpatient Medications on File Prior to Visit  Medication Sig Dispense Refill   atorvastatin (LIPITOR) 10 MG tablet Take 1 tablet (10 mg total) by mouth daily. 90 tablet 1   glimepiride (AMARYL) 2 MG tablet Take 2 tablets (4 mg total) by mouth daily. 60 tablet 3   glucose blood (TRUE METRIX BLOOD GLUCOSE TEST) test strip Use to check blood sugar up to 3 times daily. 100 each 2   Iron, Ferrous Sulfate, 325 (65 Fe) MG TABS Take 1 tablet by mouth every Monday, Wednesday, and Friday. 100 tablet 1   sitaGLIPtin (JANUVIA) 50 MG tablet Take 1 tablet (50 mg total) by mouth daily. 30 tablet 6   TRUEplus Lancets 28G MISC Use to check blood sugar up to 3 times daily. 100 each 2   nystatin-triamcinolone ointment (MYCOLOG) Apply 1 Application topically 2 (two) times daily. (Patient not taking: Reported on 03/08/2023) 30 g 0   [DISCONTINUED] metFORMIN (GLUCOPHAGE) 1000 MG tablet Take 1 tablet (1,000 mg total) by mouth 2 (two) times daily with a meal. 60 tablet 6   No current facility-administered medications on file prior to visit.    Allergies  Allergen Reactions   Keflex [Cephalexin]     Chest tightness and SOB when prescribed 01/04/22 for strep throat   Metformin And Related     Diarrhea    Social History   Socioeconomic History   Marital  status: Married    Spouse name: Museum/gallery conservator   Number of children: 4   Years of education: Not on file   Highest education level: 6th grade  Occupational History    Comment: unemployed  Tobacco Use   Smoking status: Never    Passive exposure: Past (co-wokers 10 years ago)   Smokeless tobacco: Never  Vaping Use   Vaping status: Never Used  Substance and Sexual Activity   Alcohol use: No   Drug use: Never   Sexual activity: Yes    Birth control/protection: Condom, I.U.D.  Other Topics Concern   Not on file  Social  History Narrative   ** Merged History Encounter **       Social Determinants of Health   Financial Resource Strain: Low Risk  (12/18/2022)   Overall Financial Resource Strain (CARDIA)    Difficulty of Paying Living Expenses: Not hard at all  Food Insecurity: No Food Insecurity (12/18/2022)   Hunger Vital Sign    Worried About Running Out of Food in the Last Year: Never true    Ran Out of Food in the Last Year: Never true  Transportation Needs: No Transportation Needs (12/18/2022)   PRAPARE - Administrator, Civil Service (Medical): No    Lack of Transportation (Non-Medical): No  Physical Activity: Insufficiently Active (12/18/2022)   Exercise Vital Sign    Days of Exercise per Week: 3 days    Minutes of Exercise per Session: 30 min  Stress: No Stress Concern Present (12/18/2022)   [employer] of Occupational Health - Occupational Stress Questionnaire    Feeling of Stress : Not at all  Social Connections: Moderately Integrated (12/18/2022)   Social Connection and Isolation Panel [NHANES]    Frequency of Communication with Friends and Family: Once a week    Frequency of Social Gatherings with Friends and Family: Three times a week    Attends Religious Services: More than 4 times per year    Active Member of Clubs or Organizations: No    Attends Banker Meetings: Not on file    Marital Status: Married  Intimate Partner Violence: Not At Risk (03/11/2019)   Humiliation, Afraid, Rape, and Kick questionnaire    Fear of Current or Ex-Partner: No    Emotionally Abused: No    Physically Abused: No    Sexually Abused: No    Family History  Problem Relation Age of Onset   Diabetes Mother        oral agents   Hypertension Mother    Hypertension Father    Hypertension Brother    Breast cancer Cousin    Anesthesia problems Neg Hx    Hypotension Neg Hx    Malignant hyperthermia Neg Hx    Pseudochol deficiency Neg Hx    Colon cancer Neg Hx    Colon polyps Neg Hx     Crohn's disease Neg Hx    Esophageal cancer Neg Hx    Stomach cancer Neg Hx    Rectal cancer Neg Hx    Ulcerative colitis Neg Hx     Past Surgical History:  Procedure Laterality Date   CESAREAN SECTION  05/21/2011   Procedure: CESAREAN SECTION;  Surgeon: Tilda Burrow, MD;  Location: [facility];  Service: Gynecology;  Laterality: N/A;  Primary cesarean section with delivery of baby girl at 12. Apgars 9/9.   HERPES SIMPLEX VIRUS DFA     last outbreak prior to 2017    ROS: Review of  Systems Negative except as stated above  PHYSICAL EXAM: BP 125/74 (BP Location: Left Arm, Patient Position: Sitting, Cuff Size: Normal)   Pulse 82   Temp 98.9 F (37.2 C) (Oral)   Ht 5\' 5"  (1.651 m)   Wt 203 lb (92.1 kg)   SpO2 98%   BMI 33.78 kg/m   Physical Exam  General appearance - alert, well appearing, middle-age Hispanic patient and in no distress Mental status - normal mood, behavior, speech, dress, motor activity, and thought processes Chest - clear to auscultation, no wheezes, rales or rhonchi, symmetric air entry Heart - normal rate, regular rhythm, normal S1, S2, no murmurs, rubs, clicks or gallops Abdomen - soft, nontender, nondistended, no masses or organomegaly Musculoskeletal -mild tenderness on palpation of thoracic paraspinal muscles both sides from about mid level down.  Straight leg raise negative.  Results for orders placed or performed in visit on 03/08/23  POCT URINALYSIS DIP (CLINITEK)  Result Value Ref Range   Color, UA yellow yellow   Clarity, UA clear clear   Glucose, UA negative negative mg/dL   Bilirubin, UA negative negative   Ketones, POC UA negative negative mg/dL   Spec Grav, UA 8.295 6.213 - 1.025   Blood, UA negative negative   pH, UA 6.0 5.0 - 8.0   POC PROTEIN,UA trace negative, trace   Urobilinogen, UA 0.2 0.2 or 1.0 E.U./dL   Nitrite, UA Negative Negative   Leukocytes, UA Negative Negative        Latest Ref Rng & Units 07/26/2022    3:00 AM  07/25/2022    2:29 AM 07/23/2022    6:36 PM  CMP  Glucose 70 - 99 mg/dL 086  578  469   BUN 6 - 20 mg/dL 10  10  10    Creatinine 0.44 - 1.00 mg/dL 6.29  5.28  4.13   Sodium 135 - 145 mmol/L 135  135  135   Potassium 3.5 - 5.1 mmol/L 3.6  3.8  3.4   Chloride 98 - 111 mmol/L 102  103  96   CO2 22 - 32 mmol/L 25  24  25    Calcium 8.9 - 10.3 mg/dL 9.0  8.3  9.6   Total Protein 6.5 - 8.1 g/dL 7.0     Total Bilirubin 0.3 - 1.2 mg/dL 0.3     Alkaline Phos 38 - 126 U/L 96     AST 15 - 41 U/L 16     ALT 0 - 44 U/L 21      Lipid Panel     Component Value Date/Time   CHOL 169 09/27/2021 1047   TRIG 119 09/27/2021 1047   HDL 58 09/27/2021 1047   CHOLHDL 2.9 09/27/2021 1047   LDLCALC 90 09/27/2021 1047    CBC    Component Value Date/Time   WBC 6.1 08/21/2022 1054   WBC 5.5 07/26/2022 0300   RBC 5.06 08/21/2022 1054   RBC 4.58 07/26/2022 0300   HGB 11.7 08/21/2022 1054   HGB 9.3 04/02/2015 0000   HCT 37.3 08/21/2022 1054   HCT 32 04/02/2015 0000   PLT 263 08/21/2022 1054   PLT 334 04/02/2015 0000   MCV 74 (L) 08/21/2022 1054   MCH 23.1 (L) 08/21/2022 1054   MCH 23.8 (L) 07/26/2022 0300   MCHC 31.4 (L) 08/21/2022 1054   MCHC 33.2 07/26/2022 0300   RDW 14.8 08/21/2022 1054   LYMPHSABS 1.0 07/23/2022 1836   LYMPHSABS 1.7 05/17/2022 0912   MONOABS 0.2 07/23/2022  1836   EOSABS 0.1 07/23/2022 1836   EOSABS 0.2 05/17/2022 0912   BASOSABS 0.1 07/23/2022 1836   BASOSABS 0.0 05/17/2022 0912    ASSESSMENT AND PLAN:  1. Acute bilateral thoracic back pain MSK in nature Advised to avoid heavy lifting/excessive pushing or pulling over the next several weeks. I recommend we give a trial of nonsteroidal medication Naprosyn and muscle relaxant Flexeril.  Advised that the Flexeril can cause drowsiness. - POCT URINALYSIS DIP (CLINITEK) - cyclobenzaprine (FLEXERIL) 5 MG tablet; Take 1 tablet (5 mg total) by mouth 2 (two) times daily as needed for muscle spasms.  Dispense: 30 tablet;  Refill: 0 - naproxen (NAPROSYN) 500 MG tablet; Take 1 tablet (500 mg total) by mouth 2 (two) times daily with a meal.  Dispense: 30 tablet; Refill: 0  2. Acute vaginitis - Cervicovaginal ancillary only  3. Microalbuminuria due to type 2 diabetes mellitus (HCC) Plan to recheck in about 6 months.  If it persists, we will add low-dose of lisinopril.   Patient was given the opportunity to ask questions.  Patient verbalized understanding of the plan and was able to repeat key elements of the plan.   This documentation was completed using Paediatric nurse.  Any transcriptional errors are unintentional.  Orders Placed This Encounter  Procedures   POCT URINALYSIS DIP (CLINITEK)     Requested Prescriptions    No prescriptions requested or ordered in this encounter    No follow-ups on file.  Jonah Blue, MD, FACP

## 2023-03-09 LAB — CERVICOVAGINAL ANCILLARY ONLY: Comment: NEGATIVE

## 2023-03-14 ENCOUNTER — Other Ambulatory Visit: Payer: Self-pay

## 2023-03-15 ENCOUNTER — Ambulatory Visit
Admission: RE | Admit: 2023-03-15 | Discharge: 2023-03-15 | Disposition: A | Discharge status: Home / Self Care | Payer: No Typology Code available for payment source | Source: Ambulatory Visit | Attending: Obstetrics and Gynecology | Admitting: Obstetrics and Gynecology

## 2023-03-15 ENCOUNTER — Ambulatory Visit: Payer: Self-pay | Admitting: Hematology and Oncology

## 2023-03-15 VITALS — BP 131/74 | Wt 208.0 lb

## 2023-03-15 DIAGNOSIS — Z1231 Encounter for screening mammogram for malignant neoplasm of breast: Secondary | ICD-10-CM

## 2023-03-15 DIAGNOSIS — Z01419 Encounter for gynecological examination (general) (routine) without abnormal findings: Secondary | ICD-10-CM

## 2023-03-15 NOTE — Patient Instructions (Signed)
Taught Morgan Ramsey about self breast awareness and gave educational materials to take home. Patient did need a Pap smear today due to last Pap smear was in 02/2022 per patient. Let her know [facility] will cover Pap smears every years unless has a history of abnormal Pap smears. Referred patient to the [facility] for screening mammogram. Appointment scheduled for 03/15/2023. Patient aware of appointment and will be there. Let patient know will follow up with her within the next couple weeks with results. [name] verbalized understanding.  Pascal Lux, NP 10:39 AM

## 2023-03-15 NOTE — Progress Notes (Signed)
[name] is a 47 y.o. [phone] female who presents to [facility] clinic today with no complaints.    Pap Smear: Pap smear completed today. Last Pap smear was 03/09/2022 and was normal. Per patient has history of an abnormal Pap smear. Last Pap smear result is available in Epic. 03/09/2022 Negative with HPV-; 12/31/20 Negative with HPV+   Physical exam: Breasts Breasts symmetrical. No skin abnormalities bilateral breasts. No nipple retraction bilateral breasts. No nipple discharge bilateral breasts. No lymphadenopathy. No lumps palpated bilateral breasts.  MS DIGITAL SCREENING TOMO BILATERAL  Result Date: 03/10/2022 CLINICAL DATA:  Screening. EXAM: DIGITAL SCREENING BILATERAL MAMMOGRAM WITH TOMOSYNTHESIS AND CAD TECHNIQUE: Bilateral screening digital craniocaudal and mediolateral oblique mammograms were obtained. Bilateral screening digital breast tomosynthesis was performed. The images were evaluated with computer-aided detection. COMPARISON:  Previous exam(s). ACR Breast Density Category b: There are scattered areas of fibroglandular density. FINDINGS: There are no findings suspicious for malignancy. IMPRESSION: No mammographic evidence of malignancy. A result letter of this screening mammogram will be mailed directly to the patient. RECOMMENDATION: Screening mammogram in one year. (Code:SM-B-01Y) BI-RADS CATEGORY  1: Negative. Electronically Signed   By: Gerome Sam III M.D.   On: 03/10/2022 18:42   MS DIGITAL SCREENING TOMO BILATERAL  Result Date: 10/25/2020 CLINICAL DATA:  Screening. EXAM: DIGITAL SCREENING BILATERAL MAMMOGRAM WITH TOMOSYNTHESIS AND CAD TECHNIQUE: Bilateral screening digital craniocaudal and mediolateral oblique mammograms were obtained. Bilateral screening digital breast tomosynthesis was performed. The images were evaluated with computer-aided detection. COMPARISON:  None. ACR Breast Density Category b: There are scattered areas of fibroglandular density. FINDINGS:  There are no findings suspicious for malignancy. The images were evaluated with computer-aided detection. IMPRESSION: No mammographic evidence of malignancy. A result letter of this screening mammogram will be mailed directly to the patient. RECOMMENDATION: Screening mammogram in one year. (Code:SM-B-01Y) BI-RADS CATEGORY  1: Negative. Electronically Signed   By: Gerome Sam III M.D   On: 10/25/2020 08:34         Pelvic/Bimanual Ext Genitalia No lesions, no swelling and no discharge observed on external genitalia.        Vagina Vagina pink and normal texture. No lesions or discharge observed in vagina.        Cervix Cervix is present. Cervix pink and of normal texture. No discharge observed.    Uterus Uterus is present and palpable. Uterus in normal position and normal size.        Adnexae Bilateral ovaries present and palpable. No tenderness on palpation.         Rectovaginal No rectal exam completed today since patient had no rectal complaints. No skin abnormalities observed on exam.     Smoking History: Patient has never smoked and was not referred to quit line.    Patient Navigation: Patient education provided. Access to services provided for patient through [facility] program. Ladell Pier interpreter provided. No transportation provided   Colorectal Cancer Screening: Per patient has had colonoscopy completed on 01/30/22 and was positive; repeat on 05/08/22 was negative with follow up in 10 years.   No complaints today.    Breast and Cervical Cancer Risk Assessment: Patient does not have family history of breast cancer, known genetic mutations, or radiation treatment to the chest before age 31. Patient does not have history of cervical dysplasia, immunocompromised, or DES exposure in-utero.  Risk Assessment   No risk assessment data for the current encounter  Risk Scores       03/09/2022   Last edited by:  McGill, Sherie P, LPN   5-year risk: 0.7%   Lifetime risk: 7.3%             A: [facility] exam with pap smear No complaints with benign exam.   P: Referred patient to the [facility] for a screening mammogram. Appointment scheduled 08/01/.  Morgan Ramsey A, NP 03/15/2023 10:08 AM

## 2023-04-06 ENCOUNTER — Other Ambulatory Visit: Payer: Self-pay

## 2023-04-09 ENCOUNTER — Other Ambulatory Visit: Payer: Self-pay

## 2023-04-12 ENCOUNTER — Other Ambulatory Visit: Payer: Self-pay

## 2023-04-24 ENCOUNTER — Ambulatory Visit: Payer: Self-pay | Attending: Internal Medicine | Admitting: Internal Medicine

## 2023-04-24 ENCOUNTER — Encounter: Payer: Self-pay | Admitting: Internal Medicine

## 2023-04-24 ENCOUNTER — Other Ambulatory Visit (HOSPITAL_COMMUNITY)
Admission: RE | Admit: 2023-04-24 | Discharge: 2023-04-24 | Disposition: A | Discharge status: Home / Self Care | Payer: Self-pay | Source: Ambulatory Visit | Attending: Internal Medicine | Admitting: Internal Medicine

## 2023-04-24 ENCOUNTER — Other Ambulatory Visit: Payer: Self-pay

## 2023-04-24 VITALS — BP 114/78 | HR 79 | Ht 65.0 in | Wt 203.0 lb

## 2023-04-24 DIAGNOSIS — E119 Type 2 diabetes mellitus without complications: Secondary | ICD-10-CM

## 2023-04-24 DIAGNOSIS — E669 Obesity, unspecified: Secondary | ICD-10-CM

## 2023-04-24 DIAGNOSIS — N898 Other specified noninflammatory disorders of vagina: Secondary | ICD-10-CM

## 2023-04-24 DIAGNOSIS — E1169 Type 2 diabetes mellitus with other specified complication: Secondary | ICD-10-CM

## 2023-04-24 DIAGNOSIS — Z7984 Long term (current) use of oral hypoglycemic drugs: Secondary | ICD-10-CM

## 2023-04-24 DIAGNOSIS — Z2821 Immunization not carried out because of patient refusal: Secondary | ICD-10-CM

## 2023-04-24 DIAGNOSIS — E785 Hyperlipidemia, unspecified: Secondary | ICD-10-CM

## 2023-04-24 LAB — POCT GLYCOSYLATED HEMOGLOBIN (HGB A1C): HbA1c, POC (controlled diabetic range): 9.9 % — AB (ref 0.0–7.0)

## 2023-04-24 LAB — GLUCOSE, POCT (MANUAL RESULT ENTRY): POC Glucose: 224 mg/dL — AB (ref 70–99)

## 2023-04-24 MED ORDER — TRULICITY 0.75 MG/0.5ML ~~LOC~~ SOAJ
0.7500 mg | SUBCUTANEOUS | 2 refills | Status: DC
Start: 2023-04-24 — End: 2023-08-24
  Filled 2023-04-24: qty 2, 28d supply, fill #0

## 2023-04-24 MED ORDER — FLUCONAZOLE 150 MG PO TABS
150.0000 mg | ORAL_TABLET | Freq: Every day | ORAL | 0 refills | Status: DC
Start: 2023-04-24 — End: 2023-07-20
  Filled 2023-04-24: qty 1, 1d supply, fill #0

## 2023-04-24 MED ORDER — SEMAGLUTIDE(0.25 OR 0.5MG/DOS) 2 MG/3ML ~~LOC~~ SOPN
0.2500 mg | PEN_INJECTOR | SUBCUTANEOUS | 1 refills | Status: DC
Start: 2023-04-24 — End: 2023-04-24
  Filled 2023-04-24: qty 3, 28d supply, fill #0

## 2023-04-24 NOTE — Progress Notes (Signed)
Patient ID: Morgan Ramsey, female    DOB: 1976-02-01  MRN: [id number]  CC: Diabetes (DM f/u./On-going vaginal itching /No to flu vax)   Subjective: Morgan Ramsey is a 47 y.o. female who presents for chronic ds management.  Pt has a little girl with her. 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 #[id number]  Reports vaginal itching x 2 wks Endorses yellowish vaginal discgh Active with her husband only  DM: Results for orders placed or performed in visit on 04/24/23  POCT glycosylated hemoglobin (Hb A1C)  Result Value Ref Range   Hemoglobin A1C     HbA1c POC (<> result, manual entry)     HbA1c, POC (prediabetic range)     HbA1c, POC (controlled diabetic range) 9.9 (A) 0.0 - 7.0 %  POCT glucose (manual entry)  Result Value Ref Range   POC Glucose 224 (A) 70 - 99 mg/dl  B1Y today about same compared to 4 mths ago. Should be on Amaryl 4 mg daily and Januvia 50 mg daily. Reports compliance.  Checks BS several x/wk; range 160-200s Not drinking sodas any more, cut back to 3 tortias every day; eating more lean meat Not much exercise.  No place to walk in her neighborhood and does not drive.  Has mild microalbumin of 109 when last checked HL: On atorvastatin 10 mg daily and reports taking it consistently.  Last LDL was 90.  HM: She declines flu shot.  Encouraged her to get a COVID-19 booster shot.  Overdue for diabetic eye exam.  Uninsured.  Patient Active Problem List   Diagnosis Date Noted   Sepsis secondary to UTI (HCC) 07/24/2022   Diabetes mellitus type 2 in obese 07/24/2022   Class 1 obesity due to excess calories with body mass index (BMI) of 34.0 to 34.9 in adult 07/24/2022   Pap smear of cervix shows high risk HPV present 11/15/2020   Influenza vaccine refused 07/16/2020   Hyperlipidemia associated with type 2 diabetes mellitus (HCC)  06/09/2020   Statins contraindicated 06/09/2020   Microalbuminuria due to type 2 diabetes mellitus (HCC) 06/09/2020   Type 2 diabetes mellitus with obesity (HCC) 06/04/2020   Polyhydramnios, antepartum complication 08/17/2019   Polyhydramnios in third trimester 08/13/2019   Unstable lie of fetus 08/06/2019   Obesity (BMI 30.0-34.9) 05/07/2019   Transient hypertension of pregnancy 05/07/2019   Language barrier 04/26/2015   H/O herpes simplex type 2 infection 04/26/2015   Previous cesarean section 04/26/2015   Obesity in pregnancy 04/26/2015   Anemia 04/26/2015     Current Outpatient Medications on File Prior to Visit  Medication Sig Dispense Refill   atorvastatin (LIPITOR) 10 MG tablet Take 1 tablet (10 mg total) by mouth daily. 90 tablet 1   glimepiride (AMARYL) 2 MG tablet Take 2 tablets (4 mg total) by mouth daily. 60 tablet 3   glucose blood (TRUE METRIX BLOOD GLUCOSE TEST) test strip Use to check blood sugar up to 3 times daily. 100 each 2   Iron, Ferrous Sulfate, 325 (65 Fe) MG TABS Take 1 tablet by mouth every Monday, Wednesday, and Friday. 100 tablet 1   nystatin-triamcinolone ointment (MYCOLOG) Apply 1 Application topically 2 (two) times daily. 30 g 0   sitaGLIPtin (JANUVIA) 50 MG tablet Take 1 tablet (50 mg total) by mouth daily. 30 tablet 6   TRUEplus Lancets 28G MISC Use to check blood sugar up to  3 times daily. 100 each 2   Acetaminophen (TYLENOL PO) Take by mouth. (Patient not taking: Reported on 04/24/2023)     [DISCONTINUED] metFORMIN (GLUCOPHAGE) 1000 MG tablet Take 1 tablet (1,000 mg total) by mouth 2 (two) times daily with a meal. 60 tablet 6   No current facility-administered medications on file prior to visit.    Allergies  Allergen Reactions   Keflex [Cephalexin]     Chest tightness and SOB when prescribed 01/04/22 for strep throat   Metformin And Related     Diarrhea    Social History   Socioeconomic History   Marital status: Married    Spouse name:  Museum/gallery conservator   Number of children: 4   Years of education: Not on file   Highest education level: 6th grade  Occupational History    Comment: unemployed  Tobacco Use   Smoking status: Never    Passive exposure: Past (co-wokers 10 years ago)   Smokeless tobacco: Never  Vaping Use   Vaping status: Never Used  Substance and Sexual Activity   Alcohol use: No   Drug use: Never   Sexual activity: Yes    Birth control/protection: Condom, I.U.D.  Other Topics Concern   Not on file  Social History Narrative   ** Merged History Encounter **       Social Determinants of Health   Financial Resource Strain: Low Risk  (12/18/2022)   Overall Financial Resource Strain (CARDIA)    Difficulty of Paying Living Expenses: Not hard at all  Food Insecurity: No Food Insecurity (03/15/2023)   Hunger Vital Sign    Worried About Running Out of Food in the Last Year: Never true    Ran Out of Food in the Last Year: Never true  Transportation Needs: Unmet Transportation Needs (03/15/2023)   PRAPARE - Administrator, Civil Service (Medical): Yes    Lack of Transportation (Non-Medical): Yes  Physical Activity: Insufficiently Active (12/18/2022)   Exercise Vital Sign    Days of Exercise per Week: 3 days    Minutes of Exercise per Session: 30 min  Stress: No Stress Concern Present (12/18/2022)   [employer] of Occupational Health - Occupational Stress Questionnaire    Feeling of Stress : Not at all  Social Connections: Moderately Integrated (12/18/2022)   Social Connection and Isolation Panel [NHANES]    Frequency of Communication with Friends and Family: Once a week    Frequency of Social Gatherings with Friends and Family: Three times a week    Attends Religious Services: More than 4 times per year    Active Member of Clubs or Organizations: No    Attends Banker Meetings: Not on file    Marital Status: Married  Catering manager Violence: Not At Risk (03/11/2019)   Humiliation,  Afraid, Rape, and Kick questionnaire    Fear of Current or Ex-Partner: No    Emotionally Abused: No    Physically Abused: No    Sexually Abused: No    Family History  Problem Relation Age of Onset   Diabetes Mother        oral agents   Hypertension Mother    Hypertension Father    Hypertension Brother    Breast cancer Cousin    Anesthesia problems Neg Hx    Hypotension Neg Hx    Malignant hyperthermia Neg Hx    Pseudochol deficiency Neg Hx    Colon cancer Neg Hx    Colon polyps Neg Hx  Crohn's disease Neg Hx    Esophageal cancer Neg Hx    Stomach cancer Neg Hx    Rectal cancer Neg Hx    Ulcerative colitis Neg Hx     Past Surgical History:  Procedure Laterality Date   CESAREAN SECTION  05/21/2011   Procedure: CESAREAN SECTION;  Surgeon: Tilda Burrow, MD;  Location: [facility];  Service: Gynecology;  Laterality: N/A;  Primary cesarean section with delivery of baby girl at 8. Apgars 9/9.   HERPES SIMPLEX VIRUS DFA     last outbreak prior to 2017    ROS: Review of Systems Negative except as stated above  PHYSICAL EXAM: BP 114/78 (BP Location: Left Arm, Patient Position: Sitting, Cuff Size: Normal)   Pulse 79   Ht 5\' 5"  (1.651 m)   Wt 203 lb (92.1 kg)   SpO2 99%   BMI 33.78 kg/m   Wt Readings from Last 3 Encounters:  04/24/23 203 lb (92.1 kg)  03/15/23 208 lb (94.3 kg)  03/08/23 203 lb (92.1 kg)    Physical Exam  General appearance - alert, well appearing, and in no distress Mental status - normal mood, behavior, speech, dress, motor activity, and thought processes Neck - supple, no significant adenopathy Chest - clear to auscultation, no wheezes, rales or rhonchi, symmetric air entry Heart - normal rate, regular rhythm, normal S1, S2, no murmurs, rubs, clicks or gallops Extremities - peripheral pulses normal, no pedal edema, no clubbing or cyanosis      Latest Ref Rng & Units 07/26/2022    3:00 AM 07/25/2022    2:29 AM 07/23/2022    6:36 PM  CMP   Glucose 70 - 99 mg/dL 161  096  045   BUN 6 - 20 mg/dL 10  10  10    Creatinine 0.44 - 1.00 mg/dL 4.09  8.11  9.14   Sodium 135 - 145 mmol/L 135  135  135   Potassium 3.5 - 5.1 mmol/L 3.6  3.8  3.4   Chloride 98 - 111 mmol/L 102  103  96   CO2 22 - 32 mmol/L 25  24  25    Calcium 8.9 - 10.3 mg/dL 9.0  8.3  9.6   Total Protein 6.5 - 8.1 g/dL 7.0     Total Bilirubin 0.3 - 1.2 mg/dL 0.3     Alkaline Phos 38 - 126 U/L 96     AST 15 - 41 U/L 16     ALT 0 - 44 U/L 21      Lipid Panel     Component Value Date/Time   CHOL 169 09/27/2021 1047   TRIG 119 09/27/2021 1047   HDL 58 09/27/2021 1047   CHOLHDL 2.9 09/27/2021 1047   LDLCALC 90 09/27/2021 1047    CBC    Component Value Date/Time   WBC 6.1 08/21/2022 1054   WBC 5.5 07/26/2022 0300   RBC 5.06 08/21/2022 1054   RBC 4.58 07/26/2022 0300   HGB 11.7 08/21/2022 1054   HGB 9.3 04/02/2015 0000   HCT 37.3 08/21/2022 1054   HCT 32 04/02/2015 0000   PLT 263 08/21/2022 1054   PLT 334 04/02/2015 0000   MCV 74 (L) 08/21/2022 1054   MCH 23.1 (L) 08/21/2022 1054   MCH 23.8 (L) 07/26/2022 0300   MCHC 31.4 (L) 08/21/2022 1054   MCHC 33.2 07/26/2022 0300   RDW 14.8 08/21/2022 1054   LYMPHSABS 1.0 07/23/2022 1836   LYMPHSABS 1.7 05/17/2022 0912   MONOABS 0.2  07/23/2022 1836   EOSABS 0.1 07/23/2022 1836   EOSABS 0.2 05/17/2022 0912   BASOSABS 0.1 07/23/2022 1836   BASOSABS 0.0 05/17/2022 0912    ASSESSMENT AND PLAN:  1. Type 2 diabetes mellitus with obesity (HCC) Not at goal.  Discussed trying her with Ozempic to help with better DM control and assist with wgh loss.  Went over with her how the med worse and possible S.E including N/V, pancreatitis, bowel blockage, constipation/diarrhea Advised patient to stop the medicine and be seen if she develops vomiting, severe abdominal pain which can be indicative of pancreatitis or bowel blockage, severe diarrhea or constipation.  Advised that it is a once weekly injection.  Recommend that  she has the pharmacist show her how to administer the medicine once it is dispensed. -Encouraged patient to continue trying to eat healthy. Discussed ways she can exercise at home like walking in place or dancing. Addendum: After patient had left, I received a message from the pharmacy tech Lorri Frederick that it is easier to get the Trulicity rather than the Ozempic through our pharmacy for patients that are uninsured.  I have changed the prescription from Ozempic to Trulicity. - - POCT glycosylated hemoglobin (Hb A1C) - POCT glucose (manual entry) - Comprehensive metabolic panel - Dulaglutide (TRULICITY) 0.75 MG/0.5ML SOPN; Inject 0.75 mg into the skin once a week. Stop Januvia once you start Trulicity  Dispense: 2 mL; Refill: 2  2. Diabetes mellitus treated with oral medication (HCC) See #1 above.  3. Hyperlipidemia associated with type 2 diabetes mellitus (HCC) Continue Lipitor 10 mg - Lipid panel  4. Vaginal discharge Treat empirically for yeast - Cervicovaginal ancillary only - fluconazole (DIFLUCAN) 150 MG tablet; Take 1 tablet (150 mg total) by mouth daily.  Dispense: 1 tablet; Refill: 0  5. Influenza vaccination declined    Patient was given the opportunity to ask questions.  Patient verbalized understanding of the plan and was able to repeat key elements of the plan.   This documentation was completed using Paediatric nurse.  Any transcriptional errors are unintentional.  Orders Placed This Encounter  Procedures   Lipid panel   Comprehensive metabolic panel   POCT glycosylated hemoglobin (Hb A1C)   POCT glucose (manual entry)     Requested Prescriptions   Signed Prescriptions Disp Refills   fluconazole (DIFLUCAN) 150 MG tablet 1 tablet 0    Sig: Take 1 tablet (150 mg total) by mouth daily.   Dulaglutide (TRULICITY) 0.75 MG/0.5ML SOPN 2 mL 2    Sig: Inject 0.75 mg into the skin once a week. Stop Januvia once you start Trulicity    Return in  about 4 months (around 08/24/2023) for 4 weeks with clinical pharmacist for DM.  Jonah Blue, MD, FACP

## 2023-04-25 ENCOUNTER — Other Ambulatory Visit: Payer: Self-pay | Admitting: Internal Medicine

## 2023-04-25 ENCOUNTER — Other Ambulatory Visit: Payer: Self-pay

## 2023-04-25 LAB — LIPID PANEL
Chol/HDL Ratio: 3.1 ratio (ref 0.0–4.4)
Cholesterol, Total: 144 mg/dL (ref 100–199)
HDL: 46 mg/dL (ref >39)
LDL Chol Calc (NIH): 79 mg/dL (ref 0–99)
Triglycerides: 102 mg/dL (ref 0–149)
VLDL Cholesterol Cal: 19 mg/dL (ref 5–40)

## 2023-04-25 LAB — COMPREHENSIVE METABOLIC PANEL
ALT: 11 IU/L (ref 0–32)
AST: 11 IU/L (ref 0–40)
Albumin: 4.1 g/dL (ref 3.9–4.9)
Alkaline Phosphatase: 121 IU/L (ref 44–121)
BUN/Creatinine Ratio: 15 (ref 9–23)
BUN: 10 mg/dL (ref 6–24)
Bilirubin Total: 0.3 mg/dL (ref 0.0–1.2)
CO2: 23 mmol/L (ref 20–29)
Calcium: 8.9 mg/dL (ref 8.7–10.2)
Chloride: 102 mmol/L (ref 96–106)
Creatinine, Ser: 0.68 mg/dL (ref 0.57–1.00)
Globulin, Total: 3 g/dL (ref 1.5–4.5)
Glucose: 174 mg/dL — ABNORMAL HIGH (ref 70–99)
Potassium: 4.1 mmol/L (ref 3.5–5.2)
Sodium: 137 mmol/L (ref 134–144)
Total Protein: 7.1 g/dL (ref 6.0–8.5)
eGFR: 107 mL/min/{1.73_m2} (ref >59)

## 2023-04-25 LAB — CERVICOVAGINAL ANCILLARY ONLY
Bacterial Vaginitis (gardnerella): POSITIVE — AB
Candida Glabrata: NEGATIVE
Candida Vaginitis: POSITIVE — AB
Chlamydia: NEGATIVE
Comment: NEGATIVE
Comment: NEGATIVE
Comment: NEGATIVE
Comment: NEGATIVE
Comment: NEGATIVE
Comment: NORMAL
Neisseria Gonorrhea: NEGATIVE
Trichomonas: NEGATIVE

## 2023-04-25 MED ORDER — METRONIDAZOLE 500 MG PO TABS
500.0000 mg | ORAL_TABLET | Freq: Two times a day (BID) | ORAL | 0 refills | Status: DC
  Filled 2023-04-25 (×2): qty 14, 7d supply, fill #0

## 2023-04-26 ENCOUNTER — Other Ambulatory Visit: Payer: Self-pay

## 2023-04-27 ENCOUNTER — Other Ambulatory Visit: Payer: Self-pay

## 2023-05-14 ENCOUNTER — Other Ambulatory Visit: Payer: Self-pay

## 2023-05-16 ENCOUNTER — Other Ambulatory Visit: Payer: Self-pay

## 2023-05-24 ENCOUNTER — Telehealth: Payer: Self-pay | Admitting: Internal Medicine

## 2023-05-24 NOTE — Telephone Encounter (Signed)
Appointment cancelled

## 2023-05-24 NOTE — Telephone Encounter (Signed)
Pt stated she would like to cancel her appointment with [facility]. Declined to reschedule. She stated she does not want to use Dulaglutide (TRULICITY) 0.75 MG/0.5ML SOPN. Prefers to stay on medication sitaGLIPtin (JANUVIA) 50 MG tablet and glimepiride (AMARYL) 2 MG tablet.    Please advise.

## 2023-05-25 ENCOUNTER — Ambulatory Visit: Payer: No Typology Code available for payment source | Admitting: Pharmacist

## 2023-06-22 ENCOUNTER — Other Ambulatory Visit: Payer: Self-pay

## 2023-06-22 ENCOUNTER — Other Ambulatory Visit: Payer: Self-pay | Admitting: Physician Assistant

## 2023-06-22 DIAGNOSIS — E1169 Type 2 diabetes mellitus with other specified complication: Secondary | ICD-10-CM

## 2023-06-22 MED ORDER — ATORVASTATIN CALCIUM 10 MG PO TABS
10.0000 mg | ORAL_TABLET | Freq: Every day | ORAL | 1 refills | Status: DC
  Filled 2023-06-22: qty 90, 90d supply, fill #0

## 2023-06-25 ENCOUNTER — Other Ambulatory Visit: Payer: Self-pay

## 2023-06-29 ENCOUNTER — Other Ambulatory Visit: Payer: Self-pay

## 2023-07-02 ENCOUNTER — Other Ambulatory Visit: Payer: Self-pay

## 2023-07-20 ENCOUNTER — Ambulatory Visit: Payer: Self-pay | Admitting: *Deleted

## 2023-07-20 ENCOUNTER — Other Ambulatory Visit: Payer: Self-pay | Admitting: Internal Medicine

## 2023-07-20 DIAGNOSIS — N898 Other specified noninflammatory disorders of vagina: Secondary | ICD-10-CM

## 2023-07-20 MED ORDER — FLUCONAZOLE 150 MG PO TABS
150.0000 mg | ORAL_TABLET | Freq: Every day | ORAL | 0 refills | Status: DC
  Filled 2023-07-20: qty 1, 1d supply, fill #0

## 2023-07-20 NOTE — Telephone Encounter (Signed)
Reason for Disposition  MODERATE-SEVERE itching (i.e., interferes with school, work, or sleep)  Answer Assessment - Initial Assessment Questions 1. SYMPTOM: "What's the main symptom you're concerned about?" (e.g., pain, itching, dryness)     Itching- burning, abdominal pain 2. LOCATION: "Where is the  burning located?" (e.g., inside/outside, left/right)     Vagina- inside 3. ONSET: "When did the  discharge/itching   start?"     Started 2 days ago 4. PAIN: "Is there any pain?" If Yes, ask: "How bad is it?" (Scale: 1-10; mild, moderate, severe)   -  MILD (1-3): Doesn't interfere with normal activities.    -  MODERATE (4-7): Interferes with normal activities (e.g., work or school) or awakens from sleep.     -  SEVERE (8-10): Excruciating pain, unable to do any normal activities.     Lower abdominal pain, R side, 5/10 5. ITCHING: "Is there any itching?" If Yes, ask: "How bad is it?" (Scale: 1-10; mild, moderate, severe)     8/10 6. CAUSE: "What do you think is causing the discharge?" "Have you had the same problem before? What happened then?"     Yes- patient was treated for infection 7. OTHER SYMPTOMS: "Do you have any other symptoms?" (e.g., fever, itching, vaginal bleeding, pain with urination, injury to genital area, vaginal foreign body)     Itching, pain with urination- inside  Protocols used: Vaginal Symptoms-A-AH

## 2023-07-20 NOTE — Telephone Encounter (Signed)
noted 

## 2023-07-20 NOTE — Telephone Encounter (Signed)
  Chief Complaint: itching, urinary symptoms Symptoms: itching, clear discharge- vagina, urinary pain, abdominal pain Frequency: 2 days Pertinent Negatives: Patient denies , fever, itching, vaginal bleeding Disposition: [] ED /[x] Urgent Care (no appt availability in office) / [] Appointment(In office/virtual)/ []  [facility]/ [] Home Care/ [] Refused Recommended Disposition /[] [facility]/ []  Follow-up with PCP Additional Notes: Offered open appointment today- patient declines due to transporation/child care- UC advised.

## 2023-07-23 ENCOUNTER — Other Ambulatory Visit: Payer: Self-pay

## 2023-07-30 ENCOUNTER — Other Ambulatory Visit: Payer: Self-pay | Admitting: Internal Medicine

## 2023-07-30 DIAGNOSIS — E1169 Type 2 diabetes mellitus with other specified complication: Secondary | ICD-10-CM

## 2023-07-31 ENCOUNTER — Other Ambulatory Visit: Payer: Self-pay

## 2023-07-31 MED ORDER — GLIMEPIRIDE 2 MG PO TABS
4.0000 mg | ORAL_TABLET | Freq: Every day | ORAL | 1 refills | Status: DC
  Filled 2023-07-31 – 2023-08-13 (×2): qty 60, 30d supply, fill #0

## 2023-08-09 ENCOUNTER — Other Ambulatory Visit: Payer: Self-pay

## 2023-08-11 ENCOUNTER — Encounter (HOSPITAL_COMMUNITY): Payer: Self-pay | Admitting: *Deleted

## 2023-08-11 ENCOUNTER — Ambulatory Visit (HOSPITAL_COMMUNITY)
Admission: EM | Admit: 2023-08-11 | Discharge: 2023-08-11 | Disposition: A | Discharge status: Home / Self Care | Payer: Self-pay | Attending: Family Medicine | Admitting: Family Medicine

## 2023-08-11 ENCOUNTER — Other Ambulatory Visit: Payer: Self-pay

## 2023-08-11 DIAGNOSIS — S39012A Strain of muscle, fascia and tendon of lower back, initial encounter: Secondary | ICD-10-CM | POA: Insufficient documentation

## 2023-08-11 DIAGNOSIS — E669 Obesity, unspecified: Secondary | ICD-10-CM | POA: Insufficient documentation

## 2023-08-11 DIAGNOSIS — E1169 Type 2 diabetes mellitus with other specified complication: Secondary | ICD-10-CM | POA: Insufficient documentation

## 2023-08-11 DIAGNOSIS — R109 Unspecified abdominal pain: Secondary | ICD-10-CM | POA: Insufficient documentation

## 2023-08-11 DIAGNOSIS — M25561 Pain in right knee: Secondary | ICD-10-CM | POA: Insufficient documentation

## 2023-08-11 DIAGNOSIS — R202 Paresthesia of skin: Secondary | ICD-10-CM | POA: Insufficient documentation

## 2023-08-11 DIAGNOSIS — N760 Acute vaginitis: Secondary | ICD-10-CM | POA: Insufficient documentation

## 2023-08-11 DIAGNOSIS — M25562 Pain in left knee: Secondary | ICD-10-CM | POA: Insufficient documentation

## 2023-08-11 LAB — POCT URINALYSIS DIP (MANUAL ENTRY)
Bilirubin, UA: NEGATIVE
Blood, UA: NEGATIVE
Glucose, UA: >=1000 mg/dL — AB
Ketones, POC UA: NEGATIVE mg/dL
Leukocytes, UA: NEGATIVE
Nitrite, UA: NEGATIVE
Protein Ur, POC: NEGATIVE mg/dL
Spec Grav, UA: 1.015 (ref 1.010–1.025)
Urobilinogen, UA: 0.2 U/dL
pH, UA: 5.5 (ref 5.0–8.0)

## 2023-08-11 LAB — POCT FASTING CBG KUC MANUAL ENTRY: POCT Glucose (KUC): 286 mg/dL — AB (ref 70–99)

## 2023-08-11 MED ORDER — IBUPROFEN 400 MG PO TABS: 400.0000 mg | ORAL_TABLET | Freq: Three times a day (TID) | ORAL | 0 refills | Status: DC | PRN

## 2023-08-11 NOTE — Discharge Instructions (Signed)
Qu bueno verte. Lamento tu dolor de espalda. Su orina es negativa para infeccin. Tienes elevacin de glucosa. Tome su medicamento para la diabetes segn lo recetado y vaya al servicio de urgencias si su nivel de glucosa est alto. Consulte a su PCP si tiene dolor en las piernas y hormigueo en las manos. Te llamar pronto con el resultado de tu prueba vaginal.  Envi ibuprofeno a su farmacia para el dolor.  Nice seeing you. Sorry about your back pain. Your urine is negative for infection. You do have glucose elvation. Please take your diabetic medication as prescribed and go to the ED if your glucose is running high. See your PCP for leg pain and tingling in your hands. I will call you soon with your vaginal test result.  I sent Ibuprofen to your pharmacy for pain.

## 2023-08-11 NOTE — ED Provider Notes (Signed)
[facility]    CSN: [id number] Arrival date & time: 08/11/23  1535      History   Chief Complaint Chief Complaint  Patient presents with   Back Pain   Leg Pain   Abdominal Pain    HPI Morgan Ramsey is a 48 y.o. female.   The history is provided by the patient. A language interpreter was used [phone]).  Back Pain Location:  Thoracic spine and lumbar spine Quality: Pressure like back pain. Radiates to:  Does not radiate Pain severity:  Moderate (8/10 in severity) Onset quality:  Gradual Duration:  3 days Timing:  Intermittent Progression:  Waxing and waning Chronicity:  New Context comment:  She had similar back pain with urine infection. LMP was 06/30/23. She has IUD in place Relieved by:  Nothing Worsened by:  Bending, movement and sitting Associated symptoms: abdominal pain, fever and leg pain   Associated symptoms: no chest pain, no dysuria, no headaches, no numbness and no tingling   Associated symptoms comment:  Leg pain is coming from her belly. She endorsed numbness of the tip of her left fingers x few weeks ago.  Leg Pain Lower extremity pain location: Pain in the thigh and groin area. Pain details:    Quality:  Aching   Radiates to:  Does not radiate Associated symptoms: back pain and fever   Abdominal Pain Pain location:  Suprapubic Pain quality: pressure   Pain radiates to:  Does not radiate Pain severity:  Moderate Duration:  3 days Timing:  Constant Progression:  Unchanged Chronicity:  New Context: not sick contacts   Associated symptoms: fever and vaginal discharge   Associated symptoms: no chest pain, no cough, no dysuria, no hematemesis, no hematochezia, no nausea, no shortness of breath, no sore throat, no vaginal bleeding and no vomiting   Associated symptoms comment:  Grey vaginal discharge x 1 week ago. Denies burning with urination. She is sexually active only with her partner and has no concerns about STDs Fever Max  temp prior to arrival:  Did not check her temp at home, but felt warm last night Associated symptoms: no chest pain, no cough, no dysuria, no headaches, no nausea, no sore throat and no vomiting   Associated symptoms comment:  She sneezes a lot in the morning only  Elevated BP: No prior hx of HTN.   Past Medical History:  Diagnosis Date   Allergy    pollen   AMA (advanced maternal age) multigravida 35+    Anemia    Diabetes mellitus without complication (HCC)    Gestational diabetes 2017   glyburide   Herpes    History of candidal vulvovaginitis    History of chlamydia 2008   Hyperlipidemia    Obese     Patient Active Problem List   Diagnosis Date Noted   Sepsis secondary to UTI (HCC) 07/24/2022   Type 2 diabetes mellitus with obesity (HCC) 07/24/2022   Class 1 obesity due to excess calories with body mass index (BMI) of 34.0 to 34.9 in adult 07/24/2022   Pap smear of cervix shows high risk HPV present 11/15/2020   Influenza vaccine refused 07/16/2020   Hyperlipidemia associated with type 2 diabetes mellitus (HCC) 06/09/2020   Statins contraindicated 06/09/2020   Microalbuminuria due to type 2 diabetes mellitus (HCC) 06/09/2020   Type 2 diabetes mellitus with obesity (HCC) 06/04/2020   Polyhydramnios, antepartum complication 08/17/2019   Polyhydramnios in third trimester 08/13/2019   Unstable lie of fetus 08/06/2019  Obesity (BMI 30.0-34.9) 05/07/2019   Transient hypertension of pregnancy 05/07/2019   Language barrier 04/26/2015   H/O herpes simplex type 2 infection 04/26/2015   Previous cesarean section 04/26/2015   Obesity in pregnancy 04/26/2015   Anemia 04/26/2015    Past Surgical History:  Procedure Laterality Date   CESAREAN SECTION  05/21/2011   Procedure: CESAREAN SECTION;  Surgeon: Tilda Burrow, MD;  Location: [facility];  Service: Gynecology;  Laterality: N/A;  Primary cesarean section with delivery of baby girl at 78. Apgars 9/9.   HERPES SIMPLEX VIRUS DFA      last outbreak prior to 2017        Home Medications    Prior to Admission medications   Medication Sig Start Date End Date Taking? Authorizing Provider  atorvastatin (LIPITOR) 10 MG tablet Take 1 tablet (10 mg total) by mouth daily. 06/22/23  Yes Marcine Matar, MD  glimepiride (AMARYL) 2 MG tablet Take 2 tablets (4 mg total) by mouth daily. 07/31/23  Yes Marcine Matar, MD  glucose blood (TRUE METRIX BLOOD GLUCOSE TEST) test strip Use to check blood sugar up to 3 times daily. 02/19/23  Yes Marcine Matar, MD  ibuprofen (ADVIL) 400 MG tablet Take 1 tablet (400 mg total) by mouth every 8 (eight) hours as needed. 08/11/23  Yes Doreene Eland, MD  Iron, Ferrous Sulfate, 325 (65 Fe) MG TABS Take 1 tablet by mouth every Monday, Wednesday, and Friday. 08/21/22  Yes Marcine Matar, MD  sitaGLIPtin (JANUVIA) 50 MG tablet Take 1 tablet (50 mg total) by mouth daily. 12/22/22  Yes Marcine Matar, MD  Acetaminophen (TYLENOL PO) Take by mouth. Patient not taking: Reported on 04/24/2023    [provider]  Dulaglutide (TRULICITY) 0.75 MG/0.5ML SOPN Inject 0.75 mg into the skin once a week. Stop Januvia once you start Trulicity 04/24/23   Marcine Matar, MD  fluconazole (DIFLUCAN) 150 MG tablet Take 1 tablet (150 mg total) by mouth daily. 07/20/23   Marcine Matar, MD  metroNIDAZOLE (FLAGYL) 500 MG tablet Take 1 tablet (500 mg total) by mouth 2 (two) times daily. 04/25/23   Marcine Matar, MD  nystatin-triamcinolone ointment [facility]) Apply 1 Application topically 2 (two) times daily. 01/23/23   Mayers, Cari S, PA-C  TRUEplus Lancets 28G MISC Use to check blood sugar up to 3 times daily. 10/27/21   Marcine Matar, MD  metFORMIN (GLUCOPHAGE) 1000 MG tablet Take 1 tablet (1,000 mg total) by mouth 2 (two) times daily with a meal. 07/16/20 09/14/20  Marcine Matar, MD    Family History Family History  Problem Relation Age of Onset   Diabetes Mother        oral  agents   Hypertension Mother    Hypertension Father    Hypertension Brother    Breast cancer Cousin    Anesthesia problems Neg Hx    Hypotension Neg Hx    Malignant hyperthermia Neg Hx    Pseudochol deficiency Neg Hx    Colon cancer Neg Hx    Colon polyps Neg Hx    Crohn's disease Neg Hx    Esophageal cancer Neg Hx    Stomach cancer Neg Hx    Rectal cancer Neg Hx    Ulcerative colitis Neg Hx     Social History Social History   Tobacco Use   Smoking status: Never    Passive exposure: Past (co-wokers 10 years ago)   Smokeless tobacco: Never  Vaping  Use   Vaping status: Never Used  Substance Use Topics   Alcohol use: No   Drug use: Never     Allergies   Keflex [cephalexin] and Metformin and related   Review of Systems Review of Systems  Constitutional:  Positive for fever.  HENT:  Negative for sore throat.   Respiratory:  Negative for cough and shortness of breath.   Cardiovascular:  Negative for chest pain.  Gastrointestinal:  Positive for abdominal pain. Negative for hematemesis, hematochezia, nausea and vomiting.  Genitourinary:  Positive for vaginal discharge. Negative for dysuria and vaginal bleeding.  Musculoskeletal:  Positive for back pain.  Neurological:  Negative for tingling, numbness and headaches.  All other systems reviewed and are negative.    Physical Exam Triage Vital Signs ED Triage Vitals  Encounter Vitals Group     BP 08/11/23 1710 (!) 142/82     Systolic BP Percentile --      Diastolic BP Percentile --      Pulse --      Resp 08/11/23 1710 20     Temp 08/11/23 1710 100.2 F (37.9 C)     Temp src --      SpO2 08/11/23 1710 97 %     Weight --      Height --      Head Circumference --      Peak Flow --      Pain Score 08/11/23 1705 8     Pain Loc --      Pain Education --      Exclude from Growth Chart --    No data found.  Updated Vital Signs BP (!) 142/82   Temp 100.2 F (37.9 C)   Resp 20   SpO2 97%   Visual  Acuity Right Eye Distance:   Left Eye Distance:   Bilateral Distance:    Right Eye Near:   Left Eye Near:    Bilateral Near:     Physical Exam Vitals and nursing note reviewed. Exam conducted with a chaperone present Magda Paganini).  Constitutional:      General: She is not in acute distress.    Appearance: Normal appearance. She is not ill-appearing, toxic-appearing or diaphoretic.  Eyes:     Extraocular Movements: Extraocular movements intact.     Pupils: Pupils are equal, round, and reactive to light.  Cardiovascular:     Rate and Rhythm: Normal rate and regular rhythm.     Pulses: Normal pulses.     Heart sounds: Normal heart sounds. No murmur heard. Pulmonary:     Effort: Pulmonary effort is normal. No respiratory distress.     Breath sounds: Normal breath sounds. No stridor. No wheezing or rhonchi.  Abdominal:     General: Bowel sounds are normal. There is no distension.     Palpations: Abdomen is soft. There is no mass.     Tenderness: There is no abdominal tenderness. There is right CVA tenderness, left CVA tenderness and guarding.  Genitourinary:    General: Normal vulva.     Vagina: Vaginal discharge present. No erythema, tenderness or bleeding.     Cervix: Discharge present. No cervical motion tenderness or erythema.     Uterus: Normal.      Adnexa: Right adnexa normal.  Neurological:     General: No focal deficit present.     Mental Status: She is alert.     Cranial Nerves: Cranial nerves 2-12 are intact.     Sensory: Sensation is intact.  Motor: Motor function is intact.     Coordination: Coordination is intact.     Deep Tendon Reflexes: Reflexes are normal and symmetric.     Comments: No loss of sensation of her hand      UC Treatments / Results  Labs (all labs ordered are listed, but only abnormal results are displayed) Labs Reviewed  POCT URINALYSIS DIP (MANUAL ENTRY) - Abnormal; Notable for the following components:      Result Value   Glucose, UA  >=1,000 (*)    All other components within normal limits  POCT FASTING CBG [facility] MANUAL ENTRY - Abnormal; Notable for the following components:   POCT Glucose ([facility]) 286 (*)    All other components within normal limits  CERVICOVAGINAL ANCILLARY ONLY    EKG   Radiology No results found.  Procedures Procedures (including critical care time)  Medications Ordered in UC Medications - No data to display  Initial Impression / Assessment and Plan / UC Course  I have reviewed the triage vital signs and the nursing notes.  Pertinent labs & imaging results that were available during my care of the patient were reviewed by me and considered in my medical decision making (see chart for details).  Clinical Course as of 08/11/23 1849  Sat Aug 11, 2023  1845 Back pain: Benign back exam and benign neurologic eval ?? part of viral illness vs. DJD (less likely Ibuprofen escribed prn pain [KE]  1846 Abdominal pain: Etiology unclear Benign GI exam UA neg for UTI/Pyelonephritis She has IUD in place. Hence, she is less likely to be pregnant Use Ibuprofen as needed for pain F/U with PCP soon if there is no improvement [KE]  1846 Vaginitis: Wet prep obtained I will contact her soon with her test results She agreed with the plan [KE]  1848 Leg pain: Benign LL exam ?? muscle strain vs. part of viral illness May use Ibuprofen prn pain See PCP soon for reassessment [KE]  1848 Paresthesia: This may be due to diabetic neuropathy No neurologic deficit on exam PCP f/u recommended for further evaluation and treatment [KE]  1848 Fever:  She didn't know she had a fever till this visit No URI symptoms Given low back pain and abdominal pain with hx of pyelonephritis, we obtained UA, which is negative for infection. However, she has glucosuria. Likely viral syndrome Use Ibuprofen as needed for pain and fever [KE]  1848 DM 2/Elevated BP: +Gycosuria CBG done today was 286 At the end of the visit,  she mentioned she had been out of her Glimepiride x 1 week. She is compliant with her Januvia Per the refill review, she should have a refill at her pharmacy. She will go to the pharmacy to pick up her meds F/U with PCP for DM and BP management ED precautions discussed Repeat BP checked by me was 135/88 F/U with PCP for BP monitoring [KE]    Clinical Course User Index [KE] Doreene Eland, MD     Final Clinical Impressions(s) / UC Diagnoses   Final diagnoses:  Abdominal pain, unspecified abdominal location  Strain of lumbar region, initial encounter  Vaginitis and vulvovaginitis  Arthralgia of both lower legs  Paresthesia  Type 2 diabetes mellitus with other specified complication, unspecified whether long term insulin use (HCC)     Discharge Instructions      Qu bueno verte. Lamento tu dolor de espalda. Su orina es negativa para infeccin. Tienes elevacin de glucosa. Tome su medicamento para la diabetes segn  lo recetado y vaya al servicio de urgencias si su nivel de glucosa est alto. Consulte a su PCP si tiene dolor en las piernas y hormigueo en las manos. Te llamar pronto con el resultado de tu prueba vaginal.  Envi ibuprofeno a su farmacia para el dolor.  Nice seeing you. Sorry about your back pain. Your urine is negative for infection. You do have glucose elvation. Please take your diabetic medication as prescribed and go to the ED if your glucose is running high. See your PCP for leg pain and tingling in your hands. I will call you soon with your vaginal test result.  I sent Ibuprofen to your pharmacy for pain.      ED Prescriptions     Medication Sig Dispense Auth. Provider   ibuprofen (ADVIL) 400 MG tablet Take 1 tablet (400 mg total) by mouth every 8 (eight) hours as needed. 30 tablet Doreene Eland, MD      PDMP not reviewed this encounter.   Doreene Eland, MD 08/11/23 [phone]

## 2023-08-11 NOTE — ED Triage Notes (Signed)
Pt reports for past 3 days she had back pain.leg pain ,abd pain . Pt also reports problem with private. Pt also reports finger numbness.

## 2023-08-12 ENCOUNTER — Emergency Department (HOSPITAL_COMMUNITY)
Admission: EM | Admit: 2023-08-12 | Discharge: 2023-08-13 | Discharge status: Against Medical Advice | Payer: Self-pay | Attending: Medical | Admitting: Medical

## 2023-08-12 ENCOUNTER — Emergency Department (HOSPITAL_COMMUNITY): Payer: Self-pay

## 2023-08-12 ENCOUNTER — Other Ambulatory Visit: Payer: Self-pay

## 2023-08-12 ENCOUNTER — Encounter (HOSPITAL_COMMUNITY): Payer: Self-pay | Admitting: Emergency Medicine

## 2023-08-12 DIAGNOSIS — Z5321 Procedure and treatment not carried out due to patient leaving prior to being seen by health care provider: Secondary | ICD-10-CM | POA: Insufficient documentation

## 2023-08-12 DIAGNOSIS — R200 Anesthesia of skin: Secondary | ICD-10-CM | POA: Insufficient documentation

## 2023-08-12 DIAGNOSIS — Z1152 Encounter for screening for COVID-19: Secondary | ICD-10-CM | POA: Insufficient documentation

## 2023-08-12 DIAGNOSIS — R0602 Shortness of breath: Secondary | ICD-10-CM | POA: Insufficient documentation

## 2023-08-12 DIAGNOSIS — R509 Fever, unspecified: Secondary | ICD-10-CM | POA: Insufficient documentation

## 2023-08-12 DIAGNOSIS — R202 Paresthesia of skin: Secondary | ICD-10-CM | POA: Insufficient documentation

## 2023-08-12 DIAGNOSIS — M545 Low back pain, unspecified: Secondary | ICD-10-CM | POA: Insufficient documentation

## 2023-08-12 LAB — COMPREHENSIVE METABOLIC PANEL
ALT: 18 U/L (ref 0–44)
AST: 18 U/L (ref 15–41)
Albumin: 3.2 g/dL — ABNORMAL LOW (ref 3.5–5.0)
Alkaline Phosphatase: 98 U/L (ref 38–126)
Anion gap: 7 (ref 5–15)
BUN: 11 mg/dL (ref 6–20)
CO2: 24 mmol/L (ref 22–32)
Calcium: 8.6 mg/dL — ABNORMAL LOW (ref 8.9–10.3)
Chloride: 102 mmol/L (ref 98–111)
Creatinine, Ser: 0.67 mg/dL (ref 0.44–1.00)
GFR, Estimated: >60 mL/min (ref >60)
Glucose, Bld: 331 mg/dL — ABNORMAL HIGH (ref 70–99)
Potassium: 3.7 mmol/L (ref 3.5–5.1)
Sodium: 133 mmol/L — ABNORMAL LOW (ref 135–145)
Total Bilirubin: 0.4 mg/dL (ref <1.2)
Total Protein: 7.2 g/dL (ref 6.5–8.1)

## 2023-08-12 LAB — HCG, QUANTITATIVE, PREGNANCY: hCG, Beta Chain, Quant, S: <1 m[IU]/mL (ref <5)

## 2023-08-12 LAB — URINALYSIS, ROUTINE W REFLEX MICROSCOPIC
Bilirubin Urine: NEGATIVE
Glucose, UA: >=500 mg/dL — AB
Hgb urine dipstick: NEGATIVE
Ketones, ur: NEGATIVE mg/dL
Leukocytes,Ua: NEGATIVE
Nitrite: NEGATIVE
Protein, ur: NEGATIVE mg/dL
Specific Gravity, Urine: 1.037 — ABNORMAL HIGH (ref 1.005–1.030)
pH: 5 (ref 5.0–8.0)

## 2023-08-12 LAB — CBC WITH DIFFERENTIAL/PLATELET
Abs Immature Granulocytes: 0.03 10*3/uL (ref 0.00–0.07)
Basophils Absolute: 0 10*3/uL (ref 0.0–0.1)
Basophils Relative: 0 %
Eosinophils Absolute: 0.2 10*3/uL (ref 0.0–0.5)
Eosinophils Relative: 2 %
HCT: 36.2 % (ref 36.0–46.0)
Hemoglobin: 11.6 g/dL — ABNORMAL LOW (ref 12.0–15.0)
Immature Granulocytes: 0 %
Lymphocytes Relative: 18 %
Lymphs Abs: 1.6 10*3/uL (ref 0.7–4.0)
MCH: 23.4 pg — ABNORMAL LOW (ref 26.0–34.0)
MCHC: 32 g/dL (ref 30.0–36.0)
MCV: 73.1 fL — ABNORMAL LOW (ref 80.0–100.0)
Monocytes Absolute: 0.6 10*3/uL (ref 0.1–1.0)
Monocytes Relative: 7 %
Neutro Abs: 6.5 10*3/uL (ref 1.7–7.7)
Neutrophils Relative %: 73 %
Platelets: 318 10*3/uL (ref 150–400)
RBC: 4.95 MIL/uL (ref 3.87–5.11)
RDW: 13.3 % (ref 11.5–15.5)
WBC: 8.9 10*3/uL (ref 4.0–10.5)
nRBC: 0 % (ref 0.0–0.2)

## 2023-08-12 LAB — RESP PANEL BY RT-PCR (RSV, FLU A&B, COVID)  RVPGX2
Influenza A by PCR: NEGATIVE
Influenza B by PCR: NEGATIVE
Resp Syncytial Virus by PCR: NEGATIVE
SARS Coronavirus 2 by RT PCR: NEGATIVE

## 2023-08-12 LAB — TROPONIN I (HIGH SENSITIVITY)
Troponin I (High Sensitivity): 3 ng/L (ref <18)
Troponin I (High Sensitivity): 2 ng/L (ref <18)

## 2023-08-12 MED ORDER — ACETAMINOPHEN 500 MG PO TABS
1000.0000 mg | ORAL_TABLET | Freq: Once | ORAL | Status: AC
Start: 2023-08-12 — End: 2023-08-12
  Administered 2023-08-12: 1000 mg via ORAL
  Filled 2023-08-12: qty 2

## 2023-08-12 NOTE — ED Triage Notes (Signed)
Pt reports 4 days of abdominal pain, bilateral leg pain, and back pain.  Pt states she went to UC yesterday and didn't have a UTI and had a pelvic exam but doesn't know the results.  No n/v/d or urinary symptoms.

## 2023-08-12 NOTE — ED Provider Triage Note (Cosign Needed)
Emergency Medicine Provider Triage Evaluation Note  Retina Kusak , a 47 y.o. female  was evaluated in triage.  Pt complains of  low back pain radiating down to legsx4 days. Also endorses fevers of 102F for the last 2 days. Denies any drug use. Also states that she has numbness and tingling of fingers.  No runny nose, sore throat, or cough. Also c/o SOB on exertion. Reports feeling like this last time she had a UTI. No loss of bowel or bladder.   Review of Systems  Positive: fevers Negative: Chest pain  Physical Exam  BP 129/80 (BP Location: Right Arm)   Pulse 84   Temp 98.4 F (36.9 C) (Oral)   Resp 16   SpO2 100%  Gen:   Awake, no distress   Resp:  Normal effort  MSK:   Moves extremities without difficulty  Other:  No neurodeficits, well appearing.  Medical Decision Making  Medically screening exam initiated at 3:49 PM.  Appropriate orders placed.  Betsi Camacho-Gonzalez was informed that the remainder of the evaluation will be completed by another provider, this initial triage assessment does not replace that evaluation, and the importance of remaining in the ED until their evaluation is complete.    Pete Pelt, [place] 08/12/23 1554

## 2023-08-13 ENCOUNTER — Telehealth: Payer: Self-pay | Admitting: Family Medicine

## 2023-08-13 ENCOUNTER — Encounter: Payer: Self-pay | Admitting: Physician Assistant

## 2023-08-13 ENCOUNTER — Ambulatory Visit: Payer: Self-pay | Admitting: Physician Assistant

## 2023-08-13 ENCOUNTER — Ambulatory Visit: Payer: Self-pay | Admitting: *Deleted

## 2023-08-13 ENCOUNTER — Other Ambulatory Visit: Payer: Self-pay

## 2023-08-13 VITALS — BP 131/79 | HR 89 | Ht 65.35 in | Wt 203.0 lb

## 2023-08-13 DIAGNOSIS — E669 Obesity, unspecified: Secondary | ICD-10-CM

## 2023-08-13 DIAGNOSIS — J302 Other seasonal allergic rhinitis: Secondary | ICD-10-CM

## 2023-08-13 DIAGNOSIS — E1169 Type 2 diabetes mellitus with other specified complication: Secondary | ICD-10-CM

## 2023-08-13 DIAGNOSIS — E66811 Obesity, class 1: Secondary | ICD-10-CM

## 2023-08-13 DIAGNOSIS — Z794 Long term (current) use of insulin: Secondary | ICD-10-CM

## 2023-08-13 DIAGNOSIS — E611 Iron deficiency: Secondary | ICD-10-CM

## 2023-08-13 DIAGNOSIS — Z7985 Long-term (current) use of injectable non-insulin antidiabetic drugs: Secondary | ICD-10-CM

## 2023-08-13 DIAGNOSIS — B379 Candidiasis, unspecified: Secondary | ICD-10-CM

## 2023-08-13 DIAGNOSIS — E559 Vitamin D deficiency, unspecified: Secondary | ICD-10-CM

## 2023-08-13 DIAGNOSIS — Z6833 Body mass index (BMI) 33.0-33.9, adult: Secondary | ICD-10-CM

## 2023-08-13 DIAGNOSIS — D649 Anemia, unspecified: Secondary | ICD-10-CM

## 2023-08-13 LAB — CERVICOVAGINAL ANCILLARY ONLY
Bacterial Vaginitis (gardnerella): NEGATIVE
Candida Glabrata: NEGATIVE
Candida Vaginitis: POSITIVE — AB
Comment: NEGATIVE
Comment: NEGATIVE
Comment: NEGATIVE

## 2023-08-13 LAB — POCT GLYCOSYLATED HEMOGLOBIN (HGB A1C): Hemoglobin A1C: 9.7 % — AB (ref 4.0–5.6)

## 2023-08-13 MED ORDER — FLUCONAZOLE 150 MG PO TABS
150.0000 mg | ORAL_TABLET | Freq: Once | ORAL | 0 refills | Status: AC
  Filled 2023-08-13: qty 1, 1d supply, fill #0

## 2023-08-13 MED ORDER — FLUCONAZOLE 150 MG PO TABS: 150.0000 mg | ORAL_TABLET | Freq: Once | ORAL | 0 refills | Status: DC

## 2023-08-13 MED ORDER — CETIRIZINE HCL 10 MG PO TABS
10.0000 mg | ORAL_TABLET | Freq: Every day | ORAL | 11 refills | Status: DC
  Filled 2023-08-13: qty 30, 30d supply, fill #0
  Filled 2023-10-10: qty 30, 30d supply, fill #1
  Filled 2023-11-07: qty 30, 30d supply, fill #2

## 2023-08-13 NOTE — Telephone Encounter (Signed)
  Chief Complaint: Abdominal pain, leg and back pain Symptoms: As above  6/10 Frequency: 5 days Pertinent Negatives: Patient denies redness, swelling Disposition: [] ED /[] Urgent Care (no appt availability in office) / [] Appointment(In office/virtual)/ []  [facility]/ [] Home Care/ [] Refused Recommended Disposition /[x] [facility]/ []  Follow-up with PCP Additional Notes:  Went to UC yesterday, stomach pain "Better" Leg and back pain presently  No availability at practice. Advised Mobile clinic. Reason for Disposition  [1] MODERATE pain (e.g., interferes with normal activities) AND [2] pain comes and goes (cramps) AND [3] present > 24 hours  (Exception: Pain with Vomiting or Diarrhea - see that Guideline.)    No abdominal now, leg and back pain  Answer Assessment - Initial Assessment Questions 1. LOCATION: "Where does it hurt?"      Legs and back 2. RADIATION: "Does the pain shoot anywhere else?" (e.g., chest, back)     Legs 3. ONSET: "When did the pain begin?" (e.g., minutes, hours or days ago)      5 days    5. PATTERN "Does the pain come and go, or is it constant?"    - If it comes and goes: "How long does it last?" "Do you have pain now?"     (Note: Comes and goes means the pain is intermittent. It goes away completely between bouts.)    - If constant: "Is it getting better, staying the same, or getting worse?"      (Note: Constant means the pain never goes away completely; most serious pain is constant and gets worse.)      6/10 in legs 6. SEVERITY: "How bad is the pain?"  (e.g., Scale 1-10; mild, moderate, or severe)    - MILD (1-3): Doesn't interfere with normal activities, abdomen soft and not tender to touch.     - MODERATE (4-7): Interferes with normal activities or awakens from sleep, abdomen tender to touch.     - SEVERE (8-10): Excruciating pain, doubled over, unable to do any normal activities.       6/10 7. RECURRENT SYMPTOM: "Have you ever had this  type of stomach pain before?" If Yes, ask: "When was the last time?" and "What happened that time?"      None presently, "Went to legs" 8. CAUSE: "What do you think is causing the stomach pain?"     Unsure     10. OTHER SYMPTOMS: "Do you have any other symptoms?" (e.g., back pain, diarrhea, fever, urination pain, vomiting)       Vaginal itching  Protocols used: Abdominal Pain - Female-A-AH

## 2023-08-13 NOTE — Telephone Encounter (Signed)
Noted  

## 2023-08-13 NOTE — ED Notes (Signed)
Pt was calledx3 and no answer

## 2023-08-13 NOTE — Patient Instructions (Signed)
You are going to start taking Zyrtec on a daily basis to help with the discomfort in your ears.  To help with your vaginal irritation you have any take Diflucan 1 time.  We will call you with today's lab results, please let us know if there is any else we can do for you.  Roney Jaffe, PA-C Physician Assistant [facility] [email or web address] Infeccin mictica vaginal en mujeres adultas Vaginal Yeast Infection, Adult  La infeccin mictica vaginal es una afeccin que causa secrecin vaginal y tambin dolor, hinchazn y enrojecimiento (inflamacin) de la vagina. Esta es una afeccin frecuente. Algunas mujeres contraen esta infeccin con frecuencia. Cules son las causas? La causa de la infeccin es un cambio en el equilibrio normal de las levaduras (cndida) y las bacterias normales que viven en la vagina. Esta alteracin deriva en el crecimiento excesivo de las levaduras, lo que causa la inflamacin. Qu incrementa el riesgo? Es ms probable que esta afeccin ocurra en las mujeres que: Toman antibiticos. Tienen diabetes. Toman anticonceptivos orales. Estn embarazadas. Se hacen duchas vaginales con frecuencia. Tienen debilitado el sistema de defensa del organismo (sistema inmunitario). Han estado tomando medicamentos con corticoesteroides durante [employer]. Usan ropa ajustada con frecuencia. Cules son los signos o sntomas? Los sntomas de esta afeccin incluyen: Secrecin vaginal blanca, cremosa y espesa. Hinchazn, picazn, enrojecimiento e irritacin de la vagina. Los labios de la vagina (labios de la vulva) tambin pueden infectarse. Dolor o ardor al Geographical information systems officer. Dolor durante las [employer]. Cmo se diagnostica? Esta afeccin se diagnostica en funcin de lo siguiente: Sus antecedentes mdicos. Un examen fsico. Un examen plvico. El mdico examinar una muestra de la secrecin vaginal con un microscopio.  Probablemente el mdico enve esta muestra al laboratorio para analizarla y confirmar el diagnstico. Cmo se trata? Esta afeccin se trata con medicamentos. Los [employer] pueden ser recetados o de [address]. Podrn indicarle que use uno o ms de lo siguiente: Medicamentos que se toman por boca (orales). Medicamentos que se aplican como una crema (tpicos). Medicamentos que se colocan directamente en la vagina (vulos vaginales). Siga estas indicaciones en su casa: Use o aplquese los medicamentos de venta libre y los recetados solamente como se lo haya indicado el mdico. No use tampones hasta que el mdico la autorice. No tenga sexo hasta que la infeccin haya desaparecido. El sexo puede prolongar o empeorar sus sntomas de infeccin. Pregntele al mdico cundo es seguro reanudar la actividad sexual. Chauncy Passy a todas las visitas de seguimiento. Esto es importante. Cmo se evita?  No use ropa ajustada, como pantimedias o pantalones ajustados. Use ropa interior de algodn, que permite el paso del aire. No use duchas vaginales, jabn perfumado, cremas ni talcos. Cuando vaya al bao, siempre higiencese de adelante [employer]. Si tiene diabetes, mantenga bajo control los niveles de Banker. Pregntele al mdico otras maneras de prevenir las infecciones por levaduras. Comunquese con un mdico si: Tiene fiebre. Los sntomas desaparecen y luego vuelven a Research officer, trade union. Los sntomas no mejoran con Scientist, research (medical). Sus sntomas empeoran. Aparecen nuevos sntomas. Aparecen ampollas alrededor o adentro de la vagina. Le sale sangre de la vagina y no est menstruando. Siente dolor en el abdomen. Resumen La infeccin mictica vaginal es una afeccin que causa secrecin y Educational psychologist, hinchazn y enrojecimiento (inflamacin) de la vagina. Esta afeccin se trata con medicamentos. Los [employer] pueden ser recetados o de [address]. Use o aplquese los medicamentos de venta libre  y los recetados solamente como  se lo haya indicado el mdico. No se haga duchas vaginales. Reanude la actividad sexual u use tampones como se lo haya indicado el mdico. Comunquese con un mdico si los sntomas no mejoran con el tratamiento o si los sntomas desaparecen y Engineer, technical sales. Esta informacin no tiene Theme park manager el consejo del mdico. Asegrese de hacerle al mdico cualquier pregunta que tenga. Document Revised: 11/16/2020 Document Reviewed: 11/16/2020 Elsevier Patient Education  2024 [employer].

## 2023-08-13 NOTE — Telephone Encounter (Signed)
Unable to reach her. HIPAA compliant callback message left.  Please advise when she calls, her cervical test is positive for yeast. Diflucan escribed for treatment. Follow up with PCP for reassessment.

## 2023-08-14 ENCOUNTER — Encounter: Payer: Self-pay | Admitting: Physician Assistant

## 2023-08-14 ENCOUNTER — Other Ambulatory Visit: Payer: Self-pay | Admitting: Physician Assistant

## 2023-08-14 DIAGNOSIS — E611 Iron deficiency: Secondary | ICD-10-CM

## 2023-08-14 LAB — VITAMIN D 25 HYDROXY (VIT D DEFICIENCY, FRACTURES): Vit D, 25-Hydroxy: 12.7 ng/mL — ABNORMAL LOW (ref 30.0–100.0)

## 2023-08-14 LAB — MICROALBUMIN / CREATININE URINE RATIO
Creatinine, Urine: 61.8 mg/dL
Microalb/Creat Ratio: 39 mg/g{creat} — ABNORMAL HIGH (ref 0–29)
Microalbumin, Urine: 24.4 ug/mL

## 2023-08-14 LAB — IRON,TIBC AND FERRITIN PANEL
Ferritin: 10 ng/mL — ABNORMAL LOW (ref 15–150)
Iron Saturation: 6 % — CL (ref 15–55)
Iron: 23 ug/dL — ABNORMAL LOW (ref 27–159)
Total Iron Binding Capacity: 374 ug/dL (ref 250–450)
UIBC: 351 ug/dL (ref 131–425)

## 2023-08-14 MED ORDER — VITAMIN D (ERGOCALCIFEROL) 1.25 MG (50000 UNIT) PO CAPS
50000.0000 [IU] | ORAL_CAPSULE | ORAL | 2 refills | Status: AC
  Filled 2023-08-14: qty 4, 28d supply, fill #0
  Filled 2023-09-12: qty 4, 28d supply, fill #1
  Filled 2023-10-10: qty 4, 28d supply, fill #2

## 2023-08-14 MED ORDER — IRON (FERROUS SULFATE) 325 (65 FE) MG PO TABS
1.0000 | ORAL_TABLET | ORAL | 1 refills | Status: DC
  Filled 2023-08-14: qty 100, 200d supply, fill #0
  Filled 2024-04-11: qty 100, 200d supply, fill #1

## 2023-08-14 NOTE — Addendum Note (Signed)
Addended by: Roney Jaffe on: 08/14/2023 10:14 PM   Modules accepted: Orders

## 2023-08-16 ENCOUNTER — Other Ambulatory Visit: Payer: Self-pay

## 2023-08-20 ENCOUNTER — Other Ambulatory Visit: Payer: Self-pay

## 2023-08-24 ENCOUNTER — Other Ambulatory Visit: Payer: Self-pay

## 2023-08-24 ENCOUNTER — Ambulatory Visit: Payer: Self-pay | Attending: Internal Medicine | Admitting: Internal Medicine

## 2023-08-24 VITALS — BP 121/76 | HR 87 | Temp 98.4°F | Ht 65.0 in | Wt 203.0 lb

## 2023-08-24 DIAGNOSIS — E785 Hyperlipidemia, unspecified: Secondary | ICD-10-CM

## 2023-08-24 DIAGNOSIS — Z6833 Body mass index (BMI) 33.0-33.9, adult: Secondary | ICD-10-CM

## 2023-08-24 DIAGNOSIS — E611 Iron deficiency: Secondary | ICD-10-CM

## 2023-08-24 DIAGNOSIS — E559 Vitamin D deficiency, unspecified: Secondary | ICD-10-CM

## 2023-08-24 DIAGNOSIS — E1169 Type 2 diabetes mellitus with other specified complication: Secondary | ICD-10-CM

## 2023-08-24 DIAGNOSIS — Z2821 Immunization not carried out because of patient refusal: Secondary | ICD-10-CM

## 2023-08-24 DIAGNOSIS — Z7984 Long term (current) use of oral hypoglycemic drugs: Secondary | ICD-10-CM

## 2023-08-24 DIAGNOSIS — E669 Obesity, unspecified: Secondary | ICD-10-CM

## 2023-08-24 DIAGNOSIS — E66811 Obesity, class 1: Secondary | ICD-10-CM

## 2023-08-24 DIAGNOSIS — Z794 Long term (current) use of insulin: Secondary | ICD-10-CM

## 2023-08-24 DIAGNOSIS — N920 Excessive and frequent menstruation with regular cycle: Secondary | ICD-10-CM

## 2023-08-24 DIAGNOSIS — N898 Other specified noninflammatory disorders of vagina: Secondary | ICD-10-CM

## 2023-08-24 DIAGNOSIS — Z1211 Encounter for screening for malignant neoplasm of colon: Secondary | ICD-10-CM

## 2023-08-24 MED ORDER — PEN NEEDLES 31G X 8 MM MISC
6 refills | Status: AC
  Filled 2023-08-24: qty 100, 100d supply, fill #0
  Filled 2023-10-10: qty 100, 25d supply, fill #0
  Filled 2024-01-16 (×2): qty 100, 25d supply, fill #1
  Filled 2024-04-11 – 2024-06-17 (×3): qty 100, 25d supply, fill #2
  Filled 2024-08-19: qty 100, 25d supply, fill #3

## 2023-08-24 MED ORDER — GLIMEPIRIDE 2 MG PO TABS
4.0000 mg | ORAL_TABLET | Freq: Every day | ORAL | 1 refills | Status: DC
  Filled 2023-08-24 – 2023-09-12 (×2): qty 60, 30d supply, fill #0
  Filled 2023-10-15: qty 60, 30d supply, fill #1

## 2023-08-24 MED ORDER — FLUCONAZOLE 150 MG PO TABS
150.0000 mg | ORAL_TABLET | ORAL | 0 refills | Status: DC
  Filled 2023-08-24 – 2023-11-07 (×2): qty 3, 21d supply, fill #0

## 2023-08-24 MED ORDER — SITAGLIPTIN PHOSPHATE 50 MG PO TABS
50.0000 mg | ORAL_TABLET | Freq: Every day | ORAL | 6 refills | Status: DC
  Filled 2023-08-24 – 2024-01-28 (×3): qty 30, 30d supply, fill #0

## 2023-08-24 MED ORDER — TRUE METRIX BLOOD GLUCOSE TEST VI STRP
ORAL_STRIP | 2 refills | Status: AC
  Filled 2023-08-24 – 2023-09-12 (×2): qty 100, 33d supply, fill #0
  Filled 2024-03-16 – 2024-04-09 (×5): qty 100, 33d supply, fill #1
  Filled 2024-08-02 – 2024-08-19 (×3): qty 100, 33d supply, fill #2

## 2023-08-24 MED ORDER — LANTUS SOLOSTAR 100 UNIT/ML ~~LOC~~ SOPN
10.0000 [IU] | PEN_INJECTOR | Freq: Every day | SUBCUTANEOUS | 99 refills | Status: DC
  Filled 2023-08-24: qty 3, 30d supply, fill #0
  Filled 2023-10-12: qty 3, 28d supply, fill #0
  Filled 2023-11-01: qty 3, 28d supply, fill #1
  Filled 2023-12-21: qty 3, 28d supply, fill #2
  Filled 2024-01-16 (×2): qty 3, 28d supply, fill #3

## 2023-08-24 NOTE — Progress Notes (Signed)
 Patient ID: Morgan Ramsey, female    DOB: 1975/12/11  MRN: [id number]  CC: Hyperlipidemia (Hyperlipidemia f/u. Med refill./Vaginal itching, vaginal discharge, pressure when urinating, lower pelvic pain, back pain X1 week/Numbness on fingers of L hand x1 mo/Already received flu vax. No to pneumonia vax.)   Subjective: Morgan Ramsey is a 48 y.o. female who presents for chronic ds management. 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 #Maria [id number]  DM: Lab Results  Component Value Date   HGBA1C 9.7 (A) 08/13/2023   HGBA1C 9.9 (A) 04/24/2023   HGBA1C 9.8 (A) 12/22/2022  Trulicity  was prescribed on last visit and I went over potential side effects with the patient.  She was agreeable to trying the medicine.  Today she tells me that read S.E and decided not to start the Trulicity .  Still taking Amaryl  4 mg daily and Januvia  Checks BS almost daily in a.m before BF.  Gives range 200-220 Trying to eat better but reports everything elevates BS.  Avoids sugary drinks. Does exercise videos 2x/wk HL:  taking and tolerating Lipitor 10 mg daily. LDL 74, Sept 2024  C/o vaginal itching and white discgh x 2 wks No dysuria Seen ER 08/11/23 with similar symptom and tested positive for yeast.  Given and took Diflucan  x 1.  Still having itching.  Recently dx with Vit D def with level of 12.7.  Taking high dose Vit D once a wk. Also found to have IDA and prescribed Iron  supplement Endorses heavy menses lasting 5 days.  Endorses clots and cramping.  Changes pads Q 1 hr the first day of cycle then Q 2 hrs other several days. Patient Active Problem List   Diagnosis Date Noted   Vitamin D  deficiency 08/24/2023   Iron  deficiency 08/24/2023   Menorrhagia with regular cycle 08/24/2023   Sepsis secondary to UTI (HCC) 07/24/2022   Type 2 diabetes mellitus with obesity (HCC)  07/24/2022   Class 1 obesity due to excess calories with body mass index (BMI) of 34.0 to 34.9 in adult 07/24/2022   Pap smear of cervix shows high risk HPV present 11/15/2020   Influenza vaccine refused 07/16/2020   Hyperlipidemia associated with type 2 diabetes mellitus (HCC) 06/09/2020   Statins contraindicated 06/09/2020   Microalbuminuria due to type 2 diabetes mellitus (HCC) 06/09/2020   Type 2 diabetes mellitus with obesity (HCC) 06/04/2020   Polyhydramnios, antepartum complication 08/17/2019   Polyhydramnios in third trimester 08/13/2019   Unstable lie of fetus 08/06/2019   Obesity (BMI 30.0-34.9) 05/07/2019   Transient hypertension of pregnancy 05/07/2019   Language barrier 04/26/2015   H/O herpes simplex type 2 infection 04/26/2015   Previous cesarean section 04/26/2015   Obesity in pregnancy 04/26/2015   Anemia 04/26/2015     Current Outpatient Medications on File Prior to Visit  Medication Sig Dispense Refill   Acetaminophen  (TYLENOL  PO) Take by mouth.     atorvastatin  (LIPITOR) 10 MG tablet Take 1 tablet (10 mg total) by mouth daily. 90 tablet 1   cetirizine  (ZYRTEC  ALLERGY) 10 MG tablet Take 1 tablet (10 mg total) by mouth daily. 30 tablet 11   ibuprofen  (ADVIL ) 400 MG tablet Take 1 tablet (400 mg total) by mouth every 8 (eight) hours as needed. 30 tablet 0   Iron , Ferrous Sulfate , 325 (65 Fe) MG TABS Take 1 tablet by mouth every other day. 100 tablet 1   TRUEplus  Lancets 28G MISC Use to check blood sugar up to 3 times daily. 100 each 2   Vitamin D , Ergocalciferol , (DRISDOL ) 1.25 MG (50000 UNIT) CAPS capsule Take 1 capsule (50,000 Units total) by mouth every 7 (seven) days. 4 capsule 2   nystatin -triamcinolone  ointment (MYCOLOG) Apply 1 Application topically 2 (two) times daily. (Patient not taking: Reported on 08/24/2023) 30 g 0   [DISCONTINUED] metFORMIN  (GLUCOPHAGE ) 1000 MG tablet Take 1 tablet (1,000 mg total) by mouth 2 (two) times daily with a meal. 60 tablet 6   No  current facility-administered medications on file prior to visit.    Allergies  Allergen Reactions   Keflex  [Cephalexin ]     Chest tightness and SOB when prescribed 01/04/22 for strep throat   Metformin  And Related     Diarrhea    Social History   Socioeconomic History   Marital status: Married    Spouse name: Jioberto Dimas   Number of children: 4   Years of education: Not on file   Highest education level: 6th grade  Occupational History    Comment: unemployed  Tobacco Use   Smoking status: Never    Passive exposure: Past (co-wokers 10 years ago)   Smokeless tobacco: Never  Vaping Use   Vaping status: Never Used  Substance and Sexual Activity   Alcohol use: No   Drug use: Never   Sexual activity: Yes    Birth control/protection: Condom, I.U.D.  Other Topics Concern   Not on file  Social History Narrative   ** Merged History Encounter **       Social Drivers of Health   Financial Resource Strain: Low Risk  (08/23/2023)   Overall Financial Resource Strain (CARDIA)    Difficulty of Paying Living Expenses: Not hard at all  Food Insecurity: No Food Insecurity (08/23/2023)   Hunger Vital Sign    Worried About Running Out of Food in the Last Year: Never true    Ran Out of Food in the Last Year: Never true  Transportation Needs: Unmet Transportation Needs (08/23/2023)   PRAPARE - Administrator, Civil Service (Medical): Yes    Lack of Transportation (Non-Medical): No  Physical Activity: Insufficiently Active (08/23/2023)   Exercise Vital Sign    Days of Exercise per Week: 2 days    Minutes of Exercise per Session: 20 min  Stress: No Stress Concern Present (08/23/2023)   [employer] of Occupational Health - Occupational Stress Questionnaire    Feeling of Stress : Only a little  Social Connections: Moderately Isolated (08/23/2023)   Social Connection and Isolation Panel [NHANES]    Frequency of Communication with Friends and Family: Once a week    Frequency of  Social Gatherings with Friends and Family: Once a week    Attends Religious Services: More than 4 times per year    Active Member of [employer] or Organizations: No    Attends Banker Meetings: Not on file    Marital Status: Married  Catering Manager Violence: Not At Risk (03/11/2019)   Humiliation, Afraid, Rape, and Kick questionnaire    Fear of Current or Ex-Partner: No    Emotionally Abused: No    Physically Abused: No    Sexually Abused: No    Family History  Problem Relation Age of Onset   Diabetes Mother        oral agents   Hypertension Mother    Hypertension Father    Hypertension Brother    Breast cancer Cousin  Anesthesia problems Neg Hx    Hypotension Neg Hx    Malignant hyperthermia Neg Hx    Pseudochol deficiency Neg Hx    Colon cancer Neg Hx    Colon polyps Neg Hx    Crohn's disease Neg Hx    Esophageal cancer Neg Hx    Stomach cancer Neg Hx    Rectal cancer Neg Hx    Ulcerative colitis Neg Hx     Past Surgical History:  Procedure Laterality Date   CESAREAN SECTION  05/21/2011   Procedure: CESAREAN SECTION;  Surgeon: Norleen LULLA Server, MD;  Location: [facility];  Service: Gynecology;  Laterality: N/A;  Primary cesarean section with delivery of baby girl at 22. Apgars 9/9.   HERPES SIMPLEX VIRUS DFA     last outbreak prior to 2017    ROS: Review of Systems Negative except as stated above  PHYSICAL EXAM: BP 121/76 (BP Location: Left Arm, Patient Position: Sitting, Cuff Size: Normal)   Pulse 87   Temp 98.4 F (36.9 C) (Oral)   Ht 5' 5 (1.651 m)   Wt 203 lb (92.1 kg)   SpO2 98%   BMI 33.78 kg/m   Wt Readings from Last 3 Encounters:  08/24/23 203 lb (92.1 kg)  08/13/23 203 lb (92.1 kg)  04/24/23 203 lb (92.1 kg)    Physical Exam  General appearance - alert, well appearing, and in no distress Mental status - normal mood, behavior, speech, dress, motor activity, and thought processes Chest - clear to auscultation, no wheezes, rales or  rhonchi, symmetric air entry Heart - normal rate, regular rhythm, normal S1, S2, no murmurs, rubs, clicks or gallops Abdomen - soft, nontender, nondistended, no masses or organomegaly Extremities - peripheral pulses normal, no pedal edema, no clubbing or cyanosis      Latest Ref Rng & Units 08/12/2023    4:16 PM 04/24/2023   11:12 AM 07/26/2022    3:00 AM  CMP  Glucose 70 - 99 mg/dL 668  825  762   BUN 6 - 20 mg/dL 11  10  10    Creatinine 0.44 - 1.00 mg/dL 9.32  9.31  9.37   Sodium 135 - 145 mmol/L 133  137  135   Potassium 3.5 - 5.1 mmol/L 3.7  4.1  3.6   Chloride 98 - 111 mmol/L 102  102  102   CO2 22 - 32 mmol/L 24  23  25    Calcium  8.9 - 10.3 mg/dL 8.6  8.9  9.0   Total Protein 6.5 - 8.1 g/dL 7.2  7.1  7.0   Total Bilirubin <1.2 mg/dL 0.4  0.3  0.3   Alkaline Phos 38 - 126 U/L 98  121  96   AST 15 - 41 U/L 18  11  16    ALT 0 - 44 U/L 18  11  21     Lipid Panel     Component Value Date/Time   CHOL 144 04/24/2023 1112   TRIG 102 04/24/2023 1112   HDL 46 04/24/2023 1112   CHOLHDL 3.1 04/24/2023 1112   LDLCALC 79 04/24/2023 1112    CBC    Component Value Date/Time   WBC 8.9 08/12/2023 1616   RBC 4.95 08/12/2023 1616   HGB 11.6 (L) 08/12/2023 1616   HGB 11.7 08/21/2022 1054   HGB 9.3 04/02/2015 0000   HCT 36.2 08/12/2023 1616   HCT 37.3 08/21/2022 1054   HCT 32 04/02/2015 0000   PLT 318 08/12/2023 1616  PLT 263 08/21/2022 1054   PLT 334 04/02/2015 0000   MCV 73.1 (L) 08/12/2023 1616   MCV 74 (L) 08/21/2022 1054   MCH 23.4 (L) 08/12/2023 1616   MCHC 32.0 08/12/2023 1616   RDW 13.3 08/12/2023 1616   RDW 14.8 08/21/2022 1054   LYMPHSABS 1.6 08/12/2023 1616   LYMPHSABS 1.7 05/17/2022 0912   MONOABS 0.6 08/12/2023 1616   EOSABS 0.2 08/12/2023 1616   EOSABS 0.2 05/17/2022 0912   BASOSABS 0.0 08/12/2023 1616   BASOSABS 0.0 05/17/2022 0912    ASSESSMENT AND PLAN: 1. Type 2 diabetes mellitus with obesity (HCC) (Primary) Recent A1c not at goal. Discussed and  encouraged healthy eating habits. Trulicity  taken off med list since patient decided that she does not want to try the medication.  She will continue Amaryl  4 mg daily and Januvia  50 mg daily. I recommend adding Lantus  insulin  10 units daily at bedtime.  She is agreeable to this.  She reports being on insulin  in the past during her pregnancy.  Our clinical pharmacist was not here today for insulin  administration teaching but we did send her downstairs to our pharmacy for one of the pharmacist there to show her administration. - glucose blood (TRUE METRIX BLOOD GLUCOSE TEST) test strip; Use to check blood sugar up to 3 times daily.  Dispense: 100 each; Refill: 2 - insulin  glargine (LANTUS  SOLOSTAR) 100 UNIT/ML Solostar Pen; Inject 10 Units into the skin daily.  Dispense: 15 mL; Refill: PRN - Insulin  Pen Needle (PEN NEEDLES) 31G X 8 MM MISC; USE AS DIRECTED BY PHYSICIAN.  Dispense: 100 each; Refill: 6 - sitaGLIPtin  (JANUVIA ) 50 MG tablet; Take 1 tablet (50 mg total) by mouth daily.  Dispense: 30 tablet; Refill: 6 - glimepiride  (AMARYL ) 2 MG tablet; Take 2 tablets (4 mg total) by mouth daily.  Dispense: 60 tablet; Refill: 1  2. Hyperlipidemia associated with type 2 diabetes mellitus (HCC) Continue atorvastatin  10 mg daily.  3. Menorrhagia with regular cycle 4. Iron  deficiency Secondary to heavy menstrual cycles.  Recommend that she continue iron  supplement.  We will plan to recheck CBC on subsequent visit  5. Vitamin D  deficiency Continue once weekly high-dose vitamin D .  6. Pneumococcal vaccination declined Recommended.  Patient declined  7. Vaginal discharge Will treat again with Diflucan  but this time, will have her take it once a week for the next 3 weeks - fluconazole  (DIFLUCAN ) 150 MG tablet; Take 1 tablet (150 mg total) by mouth once a week FOR 3 WEEKS  Dispense: 3 tablet; Refill: 0  8. Screening for colon cancer Discussed colon cancer screening.  She is uninsured.  She is agreeable to  the fit test. - Fecal occult blood, imunochemical([facility])      Patient was given the opportunity to ask questions.  Patient verbalized understanding of the plan and was able to repeat key elements of the plan.   This documentation was completed using Paediatric nurse.  Any transcriptional errors are unintentional.  Orders Placed This Encounter  Procedures   Fecal occult blood, imunochemical([facility])     Requested Prescriptions   Signed Prescriptions Disp Refills   glucose blood (TRUE METRIX BLOOD GLUCOSE TEST) test strip 100 each 2    Sig: Use to check blood sugar up to 3 times daily.   insulin  glargine (LANTUS  SOLOSTAR) 100 UNIT/ML Solostar Pen 15 mL PRN    Sig: Inject 10 Units into the skin daily.   Insulin  Pen Needle (PEN NEEDLES) 31G X 8 MM MISC  100 each 6    Sig: USE AS DIRECTED BY PHYSICIAN.   sitaGLIPtin  (JANUVIA ) 50 MG tablet 30 tablet 6    Sig: Take 1 tablet (50 mg total) by mouth daily.   glimepiride  (AMARYL ) 2 MG tablet 60 tablet 1    Sig: Take 2 tablets (4 mg total) by mouth daily.   fluconazole  (DIFLUCAN ) 150 MG tablet 3 tablet 0    Sig: Take 1 tablet (150 mg total) by mouth once a week FOR 3 WEEKS    Return in about 4 months (around 12/22/2023).  Barnie Louder, MD, FACP

## 2023-09-05 ENCOUNTER — Encounter: Payer: Self-pay | Admitting: Internal Medicine

## 2023-09-05 ENCOUNTER — Other Ambulatory Visit: Payer: Self-pay

## 2023-09-05 LAB — FECAL OCCULT BLOOD, IMMUNOCHEMICAL: Fecal Occult Bld: NEGATIVE

## 2023-09-12 ENCOUNTER — Other Ambulatory Visit: Payer: Self-pay

## 2023-10-02 ENCOUNTER — Other Ambulatory Visit: Payer: Self-pay

## 2023-10-03 ENCOUNTER — Other Ambulatory Visit: Payer: Self-pay | Admitting: Internal Medicine

## 2023-10-03 DIAGNOSIS — E669 Obesity, unspecified: Secondary | ICD-10-CM

## 2023-10-10 ENCOUNTER — Other Ambulatory Visit: Payer: Self-pay

## 2023-10-12 ENCOUNTER — Other Ambulatory Visit: Payer: Self-pay

## 2023-10-15 ENCOUNTER — Other Ambulatory Visit: Payer: Self-pay

## 2023-10-16 ENCOUNTER — Telehealth: Payer: Self-pay | Admitting: Internal Medicine

## 2023-10-16 ENCOUNTER — Other Ambulatory Visit: Payer: Self-pay

## 2023-10-16 NOTE — Telephone Encounter (Signed)
  Chief Complaint: Medication Questions  Additional Notes: Spoke with patient via Interpreter [phone]. Patient unsure if she still needs to be taking her Januvia medication. Advised patient that per her list visit 08/24/23, shows that she should still be taking medication as prescribed. Patient verbalized understanding.     Copied from CRM [id number]. Topic: Clinical - Medication Question >> Oct 16, 2023  3:06 PM Turkey B wrote: Reason for CRM: pt called in wants to know if she should be taking the [facility] with her insulin.

## 2023-10-24 ENCOUNTER — Encounter (HOSPITAL_COMMUNITY): Payer: Self-pay

## 2023-10-24 ENCOUNTER — Ambulatory Visit (HOSPITAL_COMMUNITY)
Admission: EM | Admit: 2023-10-24 | Discharge: 2023-10-24 | Disposition: A | Discharge status: Home / Self Care | Payer: Self-pay | Attending: Family Medicine | Admitting: Family Medicine

## 2023-10-24 DIAGNOSIS — Z3202 Encounter for pregnancy test, result negative: Secondary | ICD-10-CM

## 2023-10-24 DIAGNOSIS — N3090 Cystitis, unspecified without hematuria: Secondary | ICD-10-CM | POA: Insufficient documentation

## 2023-10-24 LAB — POCT URINE PREGNANCY: Preg Test, Ur: NEGATIVE

## 2023-10-24 LAB — POCT URINALYSIS DIP (MANUAL ENTRY)
Bilirubin, UA: NEGATIVE
Glucose, UA: >=1000 mg/dL — AB
Ketones, POC UA: NEGATIVE mg/dL
Leukocytes, UA: NEGATIVE
Nitrite, UA: NEGATIVE
Protein Ur, POC: NEGATIVE mg/dL
Spec Grav, UA: <=1.005 — AB (ref 1.010–1.025)
Urobilinogen, UA: 0.2 U/dL
pH, UA: 5.5 (ref 5.0–8.0)

## 2023-10-24 MED ORDER — PHENAZOPYRIDINE HCL 200 MG PO TABS: 200.0000 mg | ORAL_TABLET | Freq: Three times a day (TID) | ORAL | 0 refills | Status: DC

## 2023-10-24 MED ORDER — SULFAMETHOXAZOLE-TRIMETHOPRIM 800-160 MG PO TABS: 1.0000 | ORAL_TABLET | Freq: Two times a day (BID) | ORAL | 0 refills | Status: AC

## 2023-10-24 NOTE — ED Triage Notes (Signed)
 Pt c/o burning on urination with urgency and frequency x2 days. Took ibuprofen yesterday.

## 2023-10-24 NOTE — Discharge Instructions (Signed)
 Hoy le diagnosticaron una infeccin del tracto urinario. Le he recetado un antibitico y otro medicamento para [employer]. Por favor, beba mucho lquido. Se le realizar un cultivo de [place]. Si es necesario un cambio de medicacin, [place]. Si no mejora o empeora en las prximas 48 horas, regrese o acuda a urgencias para una evaluacin ms exhaustiva.  You were diagnosed with a urinary tract infection today.  I have sent out an antibiotic and another medication to help with disomfort.  Please drink plenty of fluids as well.  Your urine will be sent for culture.  If a change in medication is needed you will be notified.  If you are not improving or worsening in the next 48 hrs then please return, or go to the ER for further evaluation.

## 2023-10-24 NOTE — ED Provider Notes (Signed)
 [facility]    CSN: [id number] Arrival date & time: 10/24/23  [id number]      History   Chief Complaint Chief Complaint  Patient presents with   Urinary Tract Infection    HPI Morgan Ramsey is a 48 y.o. female.   Spanish interpreter used today.  Patient is here for urinary symptoms x 2 days.  She is having burning with urination, frequency, and urgency.   No fever, some chills.  She is having some back pain.  Some abd pain yesterday as well.  She changed birth control recently, and will have some spotting with that, and has noted some blood at times.  Not sure comes from her urine.        Past Medical History:  Diagnosis Date   Allergy    pollen   AMA (advanced maternal age) multigravida 35+    Anemia    Diabetes mellitus without complication (HCC)    Gestational diabetes 2017   glyburide   Herpes    History of candidal vulvovaginitis    History of chlamydia 2008   Hyperlipidemia    Obese     Patient Active Problem List   Diagnosis Date Noted   Vitamin D deficiency 08/24/2023   Iron deficiency 08/24/2023   Menorrhagia with regular cycle 08/24/2023   Sepsis secondary to UTI (HCC) 07/24/2022   Type 2 diabetes mellitus with obesity (HCC) 07/24/2022   Class 1 obesity due to excess calories with body mass index (BMI) of 34.0 to 34.9 in adult 07/24/2022   Pap smear of cervix shows high risk HPV present 11/15/2020   Influenza vaccine refused 07/16/2020   Hyperlipidemia associated with type 2 diabetes mellitus (HCC) 06/09/2020   Statins contraindicated 06/09/2020   Microalbuminuria due to type 2 diabetes mellitus (HCC) 06/09/2020   Type 2 diabetes mellitus with obesity (HCC) 06/04/2020   Polyhydramnios, antepartum complication 08/17/2019   Polyhydramnios in third trimester 08/13/2019   Unstable lie of fetus 08/06/2019   Obesity (BMI 30.0-34.9) 05/07/2019   Transient hypertension of pregnancy 05/07/2019   Language barrier 04/26/2015   H/O herpes  simplex type 2 infection 04/26/2015   Previous cesarean section 04/26/2015   Obesity in pregnancy 04/26/2015   Anemia 04/26/2015    Past Surgical History:  Procedure Laterality Date   CESAREAN SECTION  05/21/2011   Procedure: CESAREAN SECTION;  Surgeon: Tilda Burrow, MD;  Location: [facility];  Service: Gynecology;  Laterality: N/A;  Primary cesarean section with delivery of baby girl at 14. Apgars 9/9.   HERPES SIMPLEX VIRUS DFA     last outbreak prior to 2017        Home Medications    Prior to Admission medications   Medication Sig Start Date End Date Taking? Authorizing Provider  Acetaminophen (TYLENOL PO) Take by mouth.    [provider]  atorvastatin (LIPITOR) 10 MG tablet Take 1 tablet (10 mg total) by mouth daily. 06/22/23   Marcine Matar, MD  cetirizine (ZYRTEC ALLERGY) 10 MG tablet Take 1 tablet (10 mg total) by mouth daily. 08/13/23   Mayers, Cari S, PA-C  fluconazole (DIFLUCAN) 150 MG tablet Take 1 tablet (150 mg total) by mouth once a week FOR 3 WEEKS 08/24/23   Marcine Matar, MD  glimepiride (AMARYL) 2 MG tablet Take 2 tablets (4 mg total) by mouth daily. 08/24/23   Marcine Matar, MD  glucose blood (TRUE METRIX BLOOD GLUCOSE TEST) test strip Use to check blood sugar up to 3 times  daily. 08/24/23   Marcine Matar, MD  ibuprofen (ADVIL) 400 MG tablet Take 1 tablet (400 mg total) by mouth every 8 (eight) hours as needed. 08/11/23   Doreene Eland, MD  insulin glargine (LANTUS SOLOSTAR) 100 UNIT/ML Solostar Pen Inject 10 Units into the skin daily. 08/24/23   Marcine Matar, MD  Insulin Pen Needle (PEN NEEDLES) 31G X 8 MM MISC USE AS DIRECTED BY PHYSICIAN. 08/24/23   Marcine Matar, MD  Iron, Ferrous Sulfate, 325 (65 Fe) MG TABS Take 1 tablet by mouth every other day. 08/14/23   Mayers, Cari S, PA-C  nystatin-triamcinolone ointment (MYCOLOG) Apply 1 Application topically 2 (two) times daily. Patient not taking: Reported on 08/24/2023 01/23/23    Mayers, Cari S, PA-C  sitaGLIPtin (JANUVIA) 50 MG tablet Take 1 tablet (50 mg total) by mouth daily. 08/24/23   Marcine Matar, MD  TRUEplus Lancets 28G MISC Use to check blood sugar up to 3 times daily. 10/27/21   Marcine Matar, MD  Vitamin D, Ergocalciferol, (DRISDOL) 1.25 MG (50000 UNIT) CAPS capsule Take 1 capsule (50,000 Units total) by mouth every 7 (seven) days. 08/14/23   Mayers, Cari S, PA-C  metFORMIN (GLUCOPHAGE) 1000 MG tablet Take 1 tablet (1,000 mg total) by mouth 2 (two) times daily with a meal. 07/16/20 09/14/20  Marcine Matar, MD    Family History Family History  Problem Relation Age of Onset   Diabetes Mother        oral agents   Hypertension Mother    Hypertension Father    Hypertension Brother    Breast cancer Cousin    Anesthesia problems Neg Hx    Hypotension Neg Hx    Malignant hyperthermia Neg Hx    Pseudochol deficiency Neg Hx    Colon cancer Neg Hx    Colon polyps Neg Hx    Crohn's disease Neg Hx    Esophageal cancer Neg Hx    Stomach cancer Neg Hx    Rectal cancer Neg Hx    Ulcerative colitis Neg Hx     Social History Social History   Tobacco Use   Smoking status: Never    Passive exposure: Past (co-wokers 10 years ago)   Smokeless tobacco: Never  Vaping Use   Vaping status: Never Used  Substance Use Topics   Alcohol use: No   Drug use: Never     Allergies   Keflex [cephalexin] and Metformin and related   Review of Systems Review of Systems  Constitutional:  Positive for chills. Negative for fever.  HENT: Negative.    Respiratory: Negative.    Cardiovascular: Negative.   Gastrointestinal:  Positive for abdominal pain.  Genitourinary:  Positive for dysuria, frequency and urgency.  Musculoskeletal: Negative.   Neurological: Negative.   Psychiatric/Behavioral: Negative.       Physical Exam Triage Vital Signs ED Triage Vitals  Encounter Vitals Group     BP 10/24/23 1053 138/69     Systolic BP Percentile --       Diastolic BP Percentile --      Pulse Rate 10/24/23 1053 88     Resp 10/24/23 1053 18     Temp 10/24/23 1053 98.2 F (36.8 C)     Temp Source 10/24/23 1053 Oral     SpO2 10/24/23 1053 96 %     Weight --      Height --      Head Circumference --      Peak Flow --  Pain Score 10/24/23 1054 6     Pain Loc --      Pain Education --      Exclude from Growth Chart --    No data found.  Updated Vital Signs BP 138/69 (BP Location: Left Arm)   Pulse 88   Temp 98.2 F (36.8 C) (Oral)   Resp 18   SpO2 96%   Visual Acuity Right Eye Distance:   Left Eye Distance:   Bilateral Distance:    Right Eye Near:   Left Eye Near:    Bilateral Near:     Physical Exam Constitutional:      General: She is not in acute distress.    Appearance: Normal appearance. She is not ill-appearing or toxic-appearing.  Cardiovascular:     Rate and Rhythm: Normal rate and regular rhythm.  Pulmonary:     Effort: Pulmonary effort is normal.     Breath sounds: Normal breath sounds.  Abdominal:     Tenderness: There is abdominal tenderness. There is right CVA tenderness and left CVA tenderness. There is no guarding or rebound.  Skin:    General: Skin is warm.  Neurological:     General: No focal deficit present.     Mental Status: She is alert.  Psychiatric:        Mood and Affect: Mood normal.      UC Treatments / Results  Labs (all labs ordered are listed, but only abnormal results are displayed) Labs Reviewed  POCT URINALYSIS DIP (MANUAL ENTRY) - Abnormal; Notable for the following components:      Result Value   Glucose, UA >=1,000 (*)    Spec Grav, UA <=1.005 (*)    Blood, UA large (*)    All other components within normal limits  POCT URINE PREGNANCY  POCT URINALYSIS DIP (MANUAL ENTRY)   UPT negative  EKG   Radiology No results found.  Procedures Procedures (including critical care time)  Medications Ordered in UC Medications - No data to display  Initial Impression  / Assessment and Plan / UC Course  I have reviewed the triage vital signs and the nursing notes.  Pertinent labs & imaging results that were available during my care of the patient were reviewed by me and considered in my medical decision making (see chart for details).   Final Clinical Impressions(s) / UC Diagnoses   Final diagnoses:  Cystitis     Discharge Instructions      Hoy le diagnosticaron una infeccin del tracto urinario. Le he recetado un antibitico y otro medicamento para [employer]. Por favor, beba mucho lquido. Se le realizar un cultivo de [place]. Si es necesario un cambio de medicacin, [place]. Si no mejora o empeora en las prximas 48 horas, regrese o acuda a urgencias para una evaluacin ms exhaustiva.  You were diagnosed with a urinary tract infection today.  I have sent out an antibiotic and another medication to help with disomfort.  Please drink plenty of fluids as well.  Your urine will be sent for culture.  If a change in medication is needed you will be notified.  If you are not improving or worsening in the next 48 hrs then please return, or go to the ER for further evaluation.     ED Prescriptions     Medication Sig Dispense Auth. Provider   sulfamethoxazole-trimethoprim (BACTRIM DS) 800-160 MG tablet Take 1 tablet by mouth 2 (two) times daily for 7 days. 14 tablet Aishi Courts,  Denny Peon, MD   phenazopyridine (PYRIDIUM) 200 MG tablet Take 1 tablet (200 mg total) by mouth 3 (three) times daily. 6 tablet Jannifer Franklin, MD      PDMP not reviewed this encounter.   Jannifer Franklin, MD 10/24/23 1113

## 2023-10-26 LAB — URINE CULTURE: Culture: 70000 — AB

## 2023-11-01 ENCOUNTER — Other Ambulatory Visit: Payer: Self-pay

## 2023-11-02 ENCOUNTER — Other Ambulatory Visit: Payer: Self-pay

## 2023-11-07 ENCOUNTER — Other Ambulatory Visit: Payer: Self-pay | Admitting: Internal Medicine

## 2023-11-07 ENCOUNTER — Other Ambulatory Visit: Payer: Self-pay

## 2023-11-07 DIAGNOSIS — E1169 Type 2 diabetes mellitus with other specified complication: Secondary | ICD-10-CM

## 2023-11-07 MED ORDER — GLIMEPIRIDE 2 MG PO TABS
4.0000 mg | ORAL_TABLET | Freq: Every day | ORAL | 1 refills | Status: DC
  Filled 2023-11-07: qty 60, 30d supply, fill #0
  Filled 2023-12-21: qty 60, 30d supply, fill #1

## 2023-11-08 ENCOUNTER — Telehealth: Payer: Self-pay | Admitting: Internal Medicine

## 2023-11-08 ENCOUNTER — Other Ambulatory Visit: Payer: Self-pay

## 2023-11-08 ENCOUNTER — Encounter: Payer: Self-pay | Admitting: Internal Medicine

## 2023-11-08 DIAGNOSIS — N760 Acute vaginitis: Secondary | ICD-10-CM

## 2023-11-08 MED ORDER — FLUCONAZOLE 150 MG PO TABS
150.0000 mg | ORAL_TABLET | Freq: Every day | ORAL | 0 refills | Status: DC
Start: 2023-11-08 — End: 2024-01-02
  Filled 2023-11-08 (×2): qty 2, 2d supply, fill #0

## 2023-11-08 NOTE — Progress Notes (Signed)
 Patient ID: Morgan Ramsey, female   DOB: March 11, 1976, 48 y.o.   MRN: [id number] Virtual Visit via Video Note  I connected with Morgan Ramsey on 11/08/2023 at 1:38 PM by a video enabled telemedicine application and verified that I am speaking with the correct person using two identifiers.  Location: Patient: home Provider: Office AMN Language interpreter used during this encounter. #[place] #[id number]   I discussed the limitations of evaluation and management by telemedicine and the availability of in person appointments. The patient expressed understanding and agreed to proceed.  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 Associated with yellow/gray dischg over same time period. Active with one partner/her spouse Seen at [facility] 10/24/23 wks ago for dysuria.  Dx with UTI and treated with Bactrim x 7 days.   Outpatient Encounter Medications as of 11/08/2023  Medication Sig   fluconazole (DIFLUCAN) 150 MG tablet Take 1 tablet (150 mg total) by mouth daily.   Acetaminophen (TYLENOL PO) Take by mouth.   atorvastatin (LIPITOR) 10 MG tablet Take 1 tablet (10 mg total) by mouth daily.   cetirizine (ZYRTEC ALLERGY) 10 MG tablet Take 1 tablet (10 mg total) by mouth daily.   glimepiride (AMARYL) 2 MG tablet Take 2 tablets (4 mg total) by mouth daily.   glucose blood (TRUE METRIX BLOOD GLUCOSE TEST) test strip Use to check blood sugar up to 3 times daily.   ibuprofen (ADVIL) 400 MG tablet Take 1 tablet (400 mg total) by mouth every 8 (eight) hours as needed.   insulin glargine (LANTUS SOLOSTAR) 100 UNIT/ML Solostar Pen Inject 10 Units into the skin daily.   Insulin Pen Needle (PEN NEEDLES) 31G X 8 MM MISC USE AS DIRECTED BY PHYSICIAN.   Iron, Ferrous Sulfate, 325 (65 Fe) MG TABS Take 1 tablet by mouth every other day.   nystatin-triamcinolone ointment (MYCOLOG) Apply 1 Application  topically 2 (two) times daily. (Patient not taking: Reported on 08/24/2023)   phenazopyridine (PYRIDIUM) 200 MG tablet Take 1 tablet (200 mg total) by mouth 3 (three) times daily.   sitaGLIPtin (JANUVIA) 50 MG tablet Take 1 tablet (50 mg total) by mouth daily.   TRUEplus Lancets 28G MISC Use to check blood sugar up to 3 times daily.   Vitamin D, Ergocalciferol, (DRISDOL) 1.25 MG (50000 UNIT) CAPS capsule Take 1 capsule (50,000 Units total) by mouth every 7 (seven) days.   [DISCONTINUED] fluconazole (DIFLUCAN) 150 MG tablet Take 1 tablet (150 mg total) by mouth once a week FOR 3 WEEKS   [DISCONTINUED] metFORMIN (GLUCOPHAGE) 1000 MG tablet Take 1 tablet (1,000 mg total) by mouth 2 (two) times daily with a meal.   No facility-administered encounter medications on file as of 11/08/2023.   Observations/Objective:  Middle age Hispanic female sitting in chair in NAD Assessment and Plan: 1. Acute vaginitis (Primary) -Likely yeast infection due to use of Bactrim antibiotics.  Patient agreeable to being treated empirically with Diflucan. - fluconazole (DIFLUCAN) 150 MG tablet; Take 1 tablet (150 mg total) by mouth daily.  Dispense: 2 tablet; Refill: 0   Follow Up Instructions: Follow-up if no improvement.   I discussed the assessment and treatment plan with the patient. The patient was provided an opportunity to ask questions and all were answered. The patient agreed with the plan and demonstrated an understanding of the instructions.   The patient was advised to call back or seek an in-person evaluation if the  symptoms worsen or if the condition fails to improve as anticipated.  I spent 10 minutes dedicated to the care of this patient on the date of this encounter to include previsit review of my last note and note from recent visit to urgent care, face-to-face time with patient discussing diagnosis and management and post visit entering of orders.  This note has been created with Engineer, agricultural. Any transcriptional errors are unintentional.  Jonah Blue, MD

## 2023-11-09 ENCOUNTER — Other Ambulatory Visit: Payer: Self-pay

## 2023-11-13 ENCOUNTER — Other Ambulatory Visit: Payer: Self-pay

## 2023-11-16 ENCOUNTER — Ambulatory Visit: Payer: Self-pay

## 2023-11-16 NOTE — Telephone Encounter (Signed)
 Noted.

## 2023-11-16 NOTE — Telephone Encounter (Signed)
  Chief Complaint: allergies to pollen  Symptoms: eyes itching, dry throat  Disposition: [] ED /[] Urgent Care (no appt availability in office) / [x] Appointment(In office/virtual)/ []  [facility]/ [] Home Care/ [] Refused Recommended Disposition /[] [facility]/ []  Follow-up with PCP Additional Notes: [employer] Interpreter Alexa ID # [id number] used during call. Pt complaining of itching, swollen eyes. Both eyes are red and crusty, but right eye is worse. Pt states she has been taking OTC allergy medication for a week but no improvement. Pt has runny nose at times. Pt has virtual appt Monday at 0920. RN gave care advice and pt verbalized understanding.           Copied from CRM [id number]. Topic: Clinical - Medical Advice >> Nov 16, 2023  1:16 PM Carla L wrote: Reason for CRM: Pt inquiring if something can be sent in for allergy symptoms. Patient having itchiness all over body and on eyes. Eyes swelling, patient has a dry throat as well. Patient using allegra w/ no relief.   Please advise Reason for Disposition  [1] Taking allergy medicine > 2 days AND [2] still has pus on eyelids  Answer Assessment - Initial Assessment Questions 1. SEVERITY: "How bad is the itching?"  (e.g., Scale 1-10; mild, moderate or severe)     8 2. ONSET: "When did the eye symptoms start?" (e.g., hours or days ago)     Last week  3. EYELIDS: "Are the eyelids swollen?" If Yes, ask: "How much?"     yes 4. EYE DISCHARGE: "Is there any discharge from the eye, or eyelid crusting?" If Yes, ask: "How much?"     Crust both eyes  5. TRIGGER: "What do you think triggered the allergic reaction?" (e.g., dust, smoke, pollen, new eye make-up)     pollen 6. RECURRENT PROBLEM: "Have you experienced eye allergies before?" If Yes, ask: "When was the last time?" and "What medicine worked best in the past?"     Yes, allergy to pollen 7. CONTACTS: "Do you wear contacts?"      8. OTHER SYMPTOMS: "Do you have any other  symptoms?" (e.g., runny nose)     Runny nose, dry throat,  Protocols used: Eye - Allergy-A-AH

## 2023-11-19 ENCOUNTER — Telehealth (HOSPITAL_BASED_OUTPATIENT_CLINIC_OR_DEPARTMENT_OTHER): Payer: Self-pay | Admitting: Family Medicine

## 2023-11-19 ENCOUNTER — Other Ambulatory Visit: Payer: Self-pay

## 2023-11-19 DIAGNOSIS — J302 Other seasonal allergic rhinitis: Secondary | ICD-10-CM

## 2023-11-19 DIAGNOSIS — R0789 Other chest pain: Secondary | ICD-10-CM

## 2023-11-19 MED ORDER — ALBUTEROL SULFATE HFA 108 (90 BASE) MCG/ACT IN AERS
2.0000 | INHALATION_SPRAY | Freq: Four times a day (QID) | RESPIRATORY_TRACT | 2 refills | Status: DC | PRN
  Filled 2023-11-19: qty 6.7, 25d supply, fill #0

## 2023-11-19 MED ORDER — FLUTICASONE PROPIONATE 50 MCG/ACT NA SUSP
2.0000 | Freq: Every day | NASAL | 6 refills | Status: DC
  Filled 2023-11-19: qty 16, 30d supply, fill #0

## 2023-11-19 MED ORDER — OLOPATADINE HCL 0.1 % OP SOLN
1.0000 [drp] | Freq: Two times a day (BID) | OPHTHALMIC | 1 refills | Status: DC
  Filled 2023-11-19: qty 5, 25d supply, fill #0

## 2023-11-19 NOTE — Progress Notes (Signed)
 Virtual Visit via Video Note  I connected with Morgan Ramsey, on 11/19/2023 at 9:37 AM by video enabled telemedicine device and verified that I am speaking with the correct person using two identifiers.   Consent: I discussed the limitations, risks, security and privacy concerns of performing an evaluation and management service by telemedicine and the availability of in person appointments. I also discussed with the patient that there may be a patient responsible charge related to this service. The patient expressed understanding and agreed to proceed.   Location of Patient: Home  Location of Provider: Clinic   Persons participating in Telemedicine visit: Sindhu Camacho-Gonzalez Dr. Alvis Lemmings    Discussed the use of AI scribe software for clinical note transcription with the patient, who gave verbal consent to proceed.  History of Present Illness Morgan Ramsey, a patient with a history of type 2 diabetes mellitus, hyperlipidemia seasonal allergies, presents with symptoms of itchy eyes, rhinorrhea, frequent sneezing, and daytime drowsiness. The symptoms have been partially relieved by over-the-counter Zyrtec. The patient also reports a burning sensation in the chest when sneezing, particularly when going outside. The patient has a prescription for Flonase nasal spray but has not been using it, thinking she should not use it in combination with other medications.      Past Medical History:  Diagnosis Date   Allergy    pollen   AMA (advanced maternal age) multigravida 35+    Anemia    Diabetes mellitus without complication (HCC)    Gestational diabetes 2017   glyburide   Herpes    History of candidal vulvovaginitis    History of chlamydia 2008   Hyperlipidemia    Obese    Allergies  Allergen Reactions   Keflex [Cephalexin]     Chest tightness and SOB when prescribed 01/04/22 for strep throat   Metformin And Related     Diarrhea    Current Outpatient Medications  on File Prior to Visit  Medication Sig Dispense Refill   Acetaminophen (TYLENOL PO) Take by mouth.     atorvastatin (LIPITOR) 10 MG tablet Take 1 tablet (10 mg total) by mouth daily. 90 tablet 1   cetirizine (ZYRTEC ALLERGY) 10 MG tablet Take 1 tablet (10 mg total) by mouth daily. 30 tablet 11   fluconazole (DIFLUCAN) 150 MG tablet Take 1 tablet (150 mg total) by mouth daily. 2 tablet 0   glimepiride (AMARYL) 2 MG tablet Take 2 tablets (4 mg total) by mouth daily. 60 tablet 1   glucose blood (TRUE METRIX BLOOD GLUCOSE TEST) test strip Use to check blood sugar up to 3 times daily. 100 each 2   ibuprofen (ADVIL) 400 MG tablet Take 1 tablet (400 mg total) by mouth every 8 (eight) hours as needed. 30 tablet 0   insulin glargine (LANTUS SOLOSTAR) 100 UNIT/ML Solostar Pen Inject 10 Units into the skin daily. 15 mL PRN   Insulin Pen Needle (PEN NEEDLES) 31G X 8 MM MISC USE AS DIRECTED BY PHYSICIAN. 100 each 6   Iron, Ferrous Sulfate, 325 (65 Fe) MG TABS Take 1 tablet by mouth every other day. 100 tablet 1   nystatin-triamcinolone ointment (MYCOLOG) Apply 1 Application topically 2 (two) times daily. (Patient not taking: Reported on 08/24/2023) 30 g 0   phenazopyridine (PYRIDIUM) 200 MG tablet Take 1 tablet (200 mg total) by mouth 3 (three) times daily. 6 tablet 0   sitaGLIPtin (JANUVIA) 50 MG tablet Take 1 tablet (50 mg total) by mouth daily. 30 tablet 6  TRUEplus Lancets 28G MISC Use to check blood sugar up to 3 times daily. 100 each 2   Vitamin D, Ergocalciferol, (DRISDOL) 1.25 MG (50000 UNIT) CAPS capsule Take 1 capsule (50,000 Units total) by mouth every 7 (seven) days. 4 capsule 2   [DISCONTINUED] metFORMIN (GLUCOPHAGE) 1000 MG tablet Take 1 tablet (1,000 mg total) by mouth 2 (two) times daily with a meal. 60 tablet 6   No current facility-administered medications on file prior to visit.    ROS: See HPI  Observations/Objective: Awake, alert, oriented x3 Not in acute distress Normal  mood      Latest Ref Rng & Units 08/12/2023    4:16 PM 04/24/2023   11:12 AM 07/26/2022    3:00 AM  CMP  Glucose 70 - 99 mg/dL 161  096  045   BUN 6 - 20 mg/dL 11  10  10    Creatinine 0.44 - 1.00 mg/dL 4.09  8.11  9.14   Sodium 135 - 145 mmol/L 133  137  135   Potassium 3.5 - 5.1 mmol/L 3.7  4.1  3.6   Chloride 98 - 111 mmol/L 102  102  102   CO2 22 - 32 mmol/L 24  23  25    Calcium 8.9 - 10.3 mg/dL 8.6  8.9  9.0   Total Protein 6.5 - 8.1 g/dL 7.2  7.1  7.0   Total Bilirubin <1.2 mg/dL 0.4  0.3  0.3   Alkaline Phos 38 - 126 U/L 98  121  96   AST 15 - 41 U/L 18  11  16    ALT 0 - 44 U/L 18  11  21      Lipid Panel     Component Value Date/Time   CHOL 144 04/24/2023 1112   TRIG 102 04/24/2023 1112   HDL 46 04/24/2023 1112   CHOLHDL 3.1 04/24/2023 1112   LDLCALC 79 04/24/2023 1112   LABVLDL 19 04/24/2023 1112    Lab Results  Component Value Date   HGBA1C 9.7 (A) 08/13/2023      Assessment & Plan Seasonal allergies Symptoms include itchy eyes, rhinorrhea, and sneezing. Zyrtec provides partial relief. Combination therapy recommended. - Prescribe Patanol eye drops for itchy eyes. - Prescribe Flonase nasal spray, one spray in each nostril daily. - Advise continuation of Zyrtec.  Chest discomfort related to sneezing Burning sensation in chest when sneezing, likely allergy-related. - Prescribe an inhaler for use as needed.      No orders of the defined types were placed in this encounter.   Follow Up Instructions: Keep previously scheduled appointment with PCP   I discussed the assessment and treatment plan with the patient. The patient was provided an opportunity to ask questions and all were answered. The patient agreed with the plan and demonstrated an understanding of the instructions.   The patient was advised to call back or seek an in-person evaluation if the symptoms worsen or if the condition fails to improve as anticipated.     I provided 14 minutes  total of Telehealth time during this encounter including median intraservice time, reviewing previous notes, investigations, ordering medications, medical decision making, coordinating care and patient verbalized understanding at the end of the visit.     Hoy Register, MD, FAAFP. [facility] [place], [place] [phone]   11/19/2023, 9:37 AM

## 2023-11-19 NOTE — Patient Instructions (Signed)
 VISIT SUMMARY:  Today, you were seen for your seasonal allergies and related symptoms, including itchy eyes, runny nose, frequent sneezing, and chest discomfort when sneezing. We discussed your current medications and introduced a new treatment plan to help manage your symptoms more effectively.  YOUR PLAN:  -ALLERGIC RHINITIS: Allergic rhinitis is an allergic reaction that causes sneezing, itchy eyes, and a runny nose. To better manage your symptoms, you are prescribed Patanol eye drops for your itchy eyes and Flonase nasal spray, which you should use one spray in each nostril daily. Continue taking Zyrtec as you have been.  -CHEST DISCOMFORT RELATED TO SNEEZING: The burning sensation in your chest when sneezing is likely related to your allergies. You are prescribed an inhaler to use as needed to help alleviate this discomfort.  INSTRUCTIONS:  Please follow the new medication regimen as discussed. Use Patanol eye drops for your itchy eyes, Flonase nasal spray once daily in each nostril, and continue taking Zyrtec. Use the inhaler as needed for chest discomfort. If your symptoms do not improve or if you experience any side effects, please schedule a follow-up appointment.

## 2023-12-06 ENCOUNTER — Other Ambulatory Visit: Payer: Self-pay | Admitting: Obstetrics & Gynecology

## 2023-12-06 DIAGNOSIS — Z1231 Encounter for screening mammogram for malignant neoplasm of breast: Secondary | ICD-10-CM

## 2023-12-21 ENCOUNTER — Other Ambulatory Visit: Payer: Self-pay | Admitting: Physician Assistant

## 2023-12-21 ENCOUNTER — Other Ambulatory Visit: Payer: Self-pay

## 2023-12-21 DIAGNOSIS — E559 Vitamin D deficiency, unspecified: Secondary | ICD-10-CM

## 2023-12-28 ENCOUNTER — Other Ambulatory Visit: Payer: Self-pay

## 2023-12-28 ENCOUNTER — Telehealth: Payer: Self-pay | Admitting: Internal Medicine

## 2023-12-28 ENCOUNTER — Ambulatory Visit: Payer: Self-pay | Admitting: Internal Medicine

## 2023-12-28 NOTE — Telephone Encounter (Addendum)
 Called & spoke to the patient. Verified name & DOB. The patient's address and phone number has been verified. A Transportation voucher has also been completed. The cab is scheduled to pick the patient up 02/26/2024 9:00 am. The patient acknowledged.  Copied from CRM [id number]. Topic: General - Transportation >> Dec 27, 2023  5:01 PM [place] B wrote: Reason for CRM: pt needs transportation for appt on July 15 at 9:50

## 2024-01-02 ENCOUNTER — Other Ambulatory Visit: Payer: Self-pay | Admitting: Physician Assistant

## 2024-01-02 ENCOUNTER — Other Ambulatory Visit: Payer: Self-pay | Admitting: Internal Medicine

## 2024-01-02 ENCOUNTER — Other Ambulatory Visit: Payer: Self-pay

## 2024-01-02 DIAGNOSIS — E559 Vitamin D deficiency, unspecified: Secondary | ICD-10-CM

## 2024-01-02 DIAGNOSIS — N760 Acute vaginitis: Secondary | ICD-10-CM

## 2024-01-02 MED ORDER — FLUCONAZOLE 150 MG PO TABS
150.0000 mg | ORAL_TABLET | Freq: Every day | ORAL | 0 refills | Status: DC
  Filled 2024-01-02: qty 1, 1d supply, fill #0

## 2024-01-03 ENCOUNTER — Other Ambulatory Visit: Payer: Self-pay

## 2024-01-04 ENCOUNTER — Other Ambulatory Visit: Payer: Self-pay

## 2024-01-16 ENCOUNTER — Other Ambulatory Visit: Payer: Self-pay | Admitting: Internal Medicine

## 2024-01-16 ENCOUNTER — Other Ambulatory Visit: Payer: Self-pay

## 2024-01-16 DIAGNOSIS — E1169 Type 2 diabetes mellitus with other specified complication: Secondary | ICD-10-CM

## 2024-01-16 MED ORDER — GLIMEPIRIDE 2 MG PO TABS
4.0000 mg | ORAL_TABLET | Freq: Every day | ORAL | 1 refills | Status: DC
  Filled 2024-01-16: qty 60, 30d supply, fill #0
  Filled 2024-02-14: qty 60, 30d supply, fill #1

## 2024-01-21 ENCOUNTER — Other Ambulatory Visit: Payer: Self-pay

## 2024-01-29 ENCOUNTER — Other Ambulatory Visit (HOSPITAL_COMMUNITY): Payer: Self-pay

## 2024-01-29 ENCOUNTER — Other Ambulatory Visit: Payer: Self-pay

## 2024-02-01 ENCOUNTER — Other Ambulatory Visit: Payer: Self-pay

## 2024-02-02 ENCOUNTER — Other Ambulatory Visit (HOSPITAL_COMMUNITY): Payer: Self-pay

## 2024-02-08 ENCOUNTER — Other Ambulatory Visit (HOSPITAL_COMMUNITY): Payer: Self-pay

## 2024-02-09 ENCOUNTER — Other Ambulatory Visit (HOSPITAL_COMMUNITY): Payer: Self-pay

## 2024-02-13 ENCOUNTER — Other Ambulatory Visit (HOSPITAL_COMMUNITY): Payer: Self-pay

## 2024-02-14 ENCOUNTER — Other Ambulatory Visit: Payer: Self-pay | Admitting: Physician Assistant

## 2024-02-14 ENCOUNTER — Other Ambulatory Visit: Payer: Self-pay

## 2024-02-14 ENCOUNTER — Other Ambulatory Visit: Payer: Self-pay | Admitting: Internal Medicine

## 2024-02-14 DIAGNOSIS — E559 Vitamin D deficiency, unspecified: Secondary | ICD-10-CM

## 2024-02-14 DIAGNOSIS — N760 Acute vaginitis: Secondary | ICD-10-CM

## 2024-02-15 MED ORDER — FLUCONAZOLE 150 MG PO TABS
150.0000 mg | ORAL_TABLET | Freq: Every day | ORAL | 0 refills | Status: DC
  Filled 2024-02-15: qty 1, 1d supply, fill #0

## 2024-02-18 ENCOUNTER — Other Ambulatory Visit: Payer: Self-pay

## 2024-02-20 ENCOUNTER — Other Ambulatory Visit: Payer: Self-pay

## 2024-02-26 ENCOUNTER — Encounter: Payer: Self-pay | Admitting: Internal Medicine

## 2024-02-26 ENCOUNTER — Other Ambulatory Visit: Payer: Self-pay

## 2024-02-26 ENCOUNTER — Ambulatory Visit: Payer: Self-pay | Attending: Internal Medicine | Admitting: Internal Medicine

## 2024-02-26 ENCOUNTER — Other Ambulatory Visit (HOSPITAL_COMMUNITY)
Admission: RE | Admit: 2024-02-26 | Discharge: 2024-02-26 | Disposition: A | Discharge status: Home / Self Care | Payer: Self-pay | Source: Ambulatory Visit | Attending: Internal Medicine | Admitting: Internal Medicine

## 2024-02-26 VITALS — BP 138/77 | HR 91 | Temp 98.3°F | Ht 65.0 in | Wt 204.0 lb

## 2024-02-26 DIAGNOSIS — E1169 Type 2 diabetes mellitus with other specified complication: Secondary | ICD-10-CM

## 2024-02-26 DIAGNOSIS — Z7985 Long-term (current) use of injectable non-insulin antidiabetic drugs: Secondary | ICD-10-CM

## 2024-02-26 DIAGNOSIS — L309 Dermatitis, unspecified: Secondary | ICD-10-CM

## 2024-02-26 DIAGNOSIS — E119 Type 2 diabetes mellitus without complications: Secondary | ICD-10-CM

## 2024-02-26 DIAGNOSIS — Z794 Long term (current) use of insulin: Secondary | ICD-10-CM

## 2024-02-26 DIAGNOSIS — E785 Hyperlipidemia, unspecified: Secondary | ICD-10-CM

## 2024-02-26 DIAGNOSIS — N760 Acute vaginitis: Secondary | ICD-10-CM | POA: Insufficient documentation

## 2024-02-26 DIAGNOSIS — Z7984 Long term (current) use of oral hypoglycemic drugs: Secondary | ICD-10-CM

## 2024-02-26 DIAGNOSIS — Z6833 Body mass index (BMI) 33.0-33.9, adult: Secondary | ICD-10-CM

## 2024-02-26 DIAGNOSIS — E669 Obesity, unspecified: Secondary | ICD-10-CM

## 2024-02-26 LAB — POCT GLYCOSYLATED HEMOGLOBIN (HGB A1C): HbA1c, POC (controlled diabetic range): 10.7 % — AB (ref 0.0–7.0)

## 2024-02-26 LAB — GLUCOSE, POCT (MANUAL RESULT ENTRY): POC Glucose: 211 mg/dL — AB (ref 70–99)

## 2024-02-26 MED ORDER — NYSTATIN-TRIAMCINOLONE 100000-0.1 UNIT/GM-% EX OINT
1.0000 | TOPICAL_OINTMENT | Freq: Every day | CUTANEOUS | 0 refills | Status: DC | PRN
  Filled 2024-02-26: qty 30, 30d supply, fill #0

## 2024-02-26 MED ORDER — LANTUS SOLOSTAR 100 UNIT/ML ~~LOC~~ SOPN
14.0000 [IU] | PEN_INJECTOR | Freq: Every day | SUBCUTANEOUS | 99 refills | Status: DC
  Filled 2024-02-26: qty 3, 21d supply, fill #0
  Filled 2024-03-16 – 2024-03-17 (×2): qty 3, 21d supply, fill #1
  Filled 2024-04-09 – 2024-04-11 (×3): qty 3, 21d supply, fill #2
  Filled 2024-05-21 (×2): qty 3, 21d supply, fill #3
  Filled 2024-06-16: qty 3, 21d supply, fill #4
  Filled 2024-06-17: qty 6, 42d supply, fill #4

## 2024-02-26 MED ORDER — TRULICITY 0.75 MG/0.5ML ~~LOC~~ SOAJ
0.7500 mg | SUBCUTANEOUS | 4 refills | Status: DC
  Filled 2024-02-26: qty 2, 28d supply, fill #0

## 2024-02-26 MED ORDER — TRUEPLUS LANCETS 28G MISC
2 refills | Status: AC
  Filled 2024-02-26: qty 100, 33d supply, fill #0
  Filled 2024-04-11 – 2024-08-19 (×4): qty 100, 33d supply, fill #1

## 2024-02-26 MED ORDER — GLIMEPIRIDE 2 MG PO TABS
4.0000 mg | ORAL_TABLET | Freq: Every day | ORAL | 1 refills | Status: DC
  Filled 2024-02-26 – 2024-03-16 (×2): qty 180, 90d supply, fill #0
  Filled 2024-06-17: qty 180, 90d supply, fill #1

## 2024-02-26 MED ORDER — FLUCONAZOLE 150 MG PO TABS
150.0000 mg | ORAL_TABLET | ORAL | 0 refills | Status: DC
Start: 2024-02-26 — End: 2024-03-11
  Filled 2024-02-26: qty 4, 28d supply, fill #0

## 2024-02-26 NOTE — Patient Instructions (Signed)
 VISIT SUMMARY:  Today, you had a follow-up appointment to discuss your blood sugar management and other health concerns. We reviewed your current medications, blood sugar levels, and dietary habits. We also addressed your recent symptoms, including vaginal discharge and itching, as well as itching on your arms and lung pain.  YOUR PLAN:  -TYPE 2 DIABETES MELLITUS: Type 2 diabetes is a condition where your body does not use insulin  properly, leading to high blood sugar levels. We will start you on Trulicity , a once-weekly injection, and increase your Lantus  insulin  dose to 14 units daily. Please get instructions from your pharmacist on how to use Trulicity . Continue with your dietary modifications and aim for 30 minutes of walking 3-5 times a week.  -RECURRENT VULVOVAGINAL CANDIDIASIS: This is a yeast infection that can cause itching and discharge, often related to high blood sugar levels. We will send your self-swab to the lab for analysis. You will take fluconazole  once weekly for 4 weeks and use MycoLog cream externally as needed for itching.  -SUSPECTED VITILIGO: Vitiligo is a condition where the skin loses its pigment, causing white spots. We will refer you to a dermatologist for further evaluation and management. Please obtain a [facility] discount card for this referral.  INSTRUCTIONS:  Please follow up with the dermatologist as soon as possible for your suspected vitiligo. Continue monitoring your blood sugar levels and follow the new medication regimen. If you have any questions or concerns, do not hesitate to contact our office.

## 2024-02-26 NOTE — Progress Notes (Signed)
 Patient ID: Morgan Ramsey, female    DOB: 05/17/1976  MRN: [id number]  CC: Diabetes (DM f/u. Med refill. Durene itching, white discharge X2 weeks/On-going itching of arms/Intermittent pain of L foot when walking /Yes to Hep B vax)   Subjective: Morgan Ramsey is a 48 y.o. female who presents for chronic ds management. 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 # [id number], Aracely  Discussed the use of AI scribe software for clinical note transcription with the patient, who gave verbal consent to proceed.  History of Present Illness Morgan Ramsey is a 48 year old female with diabetes who presents for follow-up of her blood sugar management.  DM: Results for orders placed or performed in visit on 02/26/24  POCT glucose (manual entry)   Collection Time: 02/26/24  9:34 AM  Result Value Ref Range   POC Glucose 211 (A) 70 - 99 mg/dl  POCT glycosylated hemoglobin (Hb A1C)   Collection Time: 02/26/24  9:44 AM  Result Value Ref Range   Hemoglobin A1C     HbA1c POC (<> result, manual entry)     HbA1c, POC (prediabetic range)     HbA1c, POC (controlled diabetic range) 10.7 (A) 0.0 - 7.0 %   She has been taking glimepiride  4 mg daily consistently but has not taken Januvia  50 mg daily for about a month due to pharmacy issues.  Looks like she needs to reapply for the patient assistance program that she was getting it through.  She is also on Lantus  insulin , 10 units daily, which she takes consistently. Her blood sugar levels are checked twice daily, with morning levels ranging from 180 to 220 mg/dL and evening levels from 240 to 299 mg/dL. She attempts dietary management by eating smaller portions and choosing vegetables, fish, and grilled chicken, though she sometimes eats more when dining out. She occasionally goes for walks  during the week.  She has been experiencing vaginal discharge and itching for the past two to three weeks. The discharge is white, and fluconazole  has alleviated the internal itching but not the external. She has performed a self-swab for further evaluation.  She mentions itching on her arms, which starts as pimples and leaves white spots once they resolve.   HL: Reports compliance with taking atorvastatin  10 mg    Patient Active Problem List   Diagnosis Date Noted   Vitamin D  deficiency 08/24/2023   Iron  deficiency 08/24/2023   Menorrhagia with regular cycle 08/24/2023   Sepsis secondary to UTI (HCC) 07/24/2022   Type 2 diabetes mellitus with obesity (HCC) 07/24/2022   Class 1 obesity due to excess calories with body mass index (BMI) of 34.0 to 34.9 in adult 07/24/2022   Pap smear of cervix shows high risk HPV present 11/15/2020   Influenza vaccine refused 07/16/2020   Hyperlipidemia associated with type 2 diabetes mellitus (HCC) 06/09/2020   Statins contraindicated 06/09/2020   Microalbuminuria due to type 2 diabetes mellitus (HCC) 06/09/2020   Type 2 diabetes mellitus with obesity (HCC) 06/04/2020   Polyhydramnios, antepartum complication 08/17/2019   Polyhydramnios in third trimester 08/13/2019   Unstable lie of fetus 08/06/2019   Obesity (BMI 30.0-34.9) 05/07/2019   Transient hypertension of pregnancy 05/07/2019   Language barrier 04/26/2015   H/O herpes simplex type 2 infection 04/26/2015   Previous cesarean section 04/26/2015   Obesity in pregnancy 04/26/2015   Anemia 04/26/2015  Current Outpatient Medications on File Prior to Visit  Medication Sig Dispense Refill   Acetaminophen  (TYLENOL  PO) Take by mouth.     albuterol  (VENTOLIN  HFA) 108 (90 Base) MCG/ACT inhaler Inhale 2 puffs into the lungs every 6 (six) hours as needed for wheezing or shortness of breath. 6.7 g 2   atorvastatin  (LIPITOR) 10 MG tablet Take 1 tablet (10 mg total) by mouth daily. 90 tablet 1    fluticasone  (FLONASE ) 50 MCG/ACT nasal spray Place 2 sprays into both nostrils daily. 16 g 6   glucose blood (TRUE METRIX BLOOD GLUCOSE TEST) test strip Use to check blood sugar up to 3 times daily. 100 each 2   Insulin  Pen Needle (PEN NEEDLES) 31G X 8 MM MISC USE AS DIRECTED BY PHYSICIAN. 100 each 6   Iron , Ferrous Sulfate , 325 (65 Fe) MG TABS Take 1 tablet by mouth every other day. 100 tablet 1   Vitamin D , Ergocalciferol , (DRISDOL ) 1.25 MG (50000 UNIT) CAPS capsule Take 1 capsule (50,000 Units total) by mouth every 7 (seven) days. 4 capsule 2   [DISCONTINUED] metFORMIN  (GLUCOPHAGE ) 1000 MG tablet Take 1 tablet (1,000 mg total) by mouth 2 (two) times daily with a meal. 60 tablet 6   No current facility-administered medications on file prior to visit.    Allergies  Allergen Reactions   Keflex  [Cephalexin ]     Chest tightness and SOB when prescribed 01/04/22 for strep throat   Metformin  And Related     Diarrhea    Social History   Socioeconomic History   Marital status: Married    Spouse name: Jioberto Dimas   Number of children: 4   Years of education: Not on file   Highest education level: 6th grade  Occupational History    Comment: unemployed  Tobacco Use   Smoking status: Never    Passive exposure: Past (co-wokers 10 years ago)   Smokeless tobacco: Never  Vaping Use   Vaping status: Never Used  Substance and Sexual Activity   Alcohol use: No   Drug use: Never   Sexual activity: Yes    Birth control/protection: Condom, I.U.D.  Other Topics Concern   Not on file  Social History Narrative   ** Merged History Encounter **       Social Drivers of Health   Financial Resource Strain: Medium Risk (02/26/2024)   Overall Financial Resource Strain (CARDIA)    Difficulty of Paying Living Expenses: Somewhat hard  Food Insecurity: No Food Insecurity (02/26/2024)   Hunger Vital Sign    Worried About Running Out of Food in the Last Year: Never true    Ran Out of Food in the Last  Year: Never true  Transportation Needs: No Transportation Needs (02/25/2024)   PRAPARE - Administrator, Civil Service (Medical): No    Lack of Transportation (Non-Medical): No  Physical Activity: Insufficiently Active (02/26/2024)   Exercise Vital Sign    Days of Exercise per Week: 2 days    Minutes of Exercise per Session: 20 min  Stress: No Stress Concern Present (02/26/2024)   [employer] of Occupational Health - Occupational Stress Questionnaire    Feeling of Stress: Not at all  Social Connections: Socially Integrated (02/26/2024)   Social Connection and Isolation Panel    Frequency of Communication with Friends and Family: Three times a week    Frequency of Social Gatherings with Friends and Family: Twice a week    Attends Religious Services: More than 4 times per year  Active Member of Clubs or Organizations: Yes    Attends Banker Meetings: More than 4 times per year    Marital Status: Married  Catering manager Violence: Not At Risk (02/26/2024)   Humiliation, Afraid, Rape, and Kick questionnaire    Fear of Current or Ex-Partner: No    Emotionally Abused: No    Physically Abused: No    Sexually Abused: No    Family History  Problem Relation Age of Onset   Diabetes Mother        oral agents   Hypertension Mother    Hypertension Father    Hypertension Brother    Breast cancer Cousin    Anesthesia problems Neg Hx    Hypotension Neg Hx    Malignant hyperthermia Neg Hx    Pseudochol deficiency Neg Hx    Colon cancer Neg Hx    Colon polyps Neg Hx    Crohn's disease Neg Hx    Esophageal cancer Neg Hx    Stomach cancer Neg Hx    Rectal cancer Neg Hx    Ulcerative colitis Neg Hx     Past Surgical History:  Procedure Laterality Date   CESAREAN SECTION  05/21/2011   Procedure: CESAREAN SECTION;  Surgeon: Norleen LULLA Server, MD;  Location: [facility];  Service: Gynecology;  Laterality: N/A;  Primary cesarean section with delivery of baby girl at  73. Apgars 9/9.   HERPES SIMPLEX VIRUS DFA     last outbreak prior to 2017    ROS: Review of Systems Negative except as stated above  PHYSICAL EXAM: BP 138/77 (BP Location: Left Arm, Patient Position: Sitting, Cuff Size: Normal)   Pulse 91   Temp 98.3 F (36.8 C) (Oral)   Ht 5' 5 (1.651 m)   Wt 204 lb (92.5 kg)   SpO2 99%   BMI 33.95 kg/m   Wt Readings from Last 3 Encounters:  02/26/24 204 lb (92.5 kg)  08/24/23 203 lb (92.1 kg)  08/13/23 203 lb (92.1 kg)    Physical Exam  General appearance - alert, well appearing, and in no distress Mental status - normal mood, behavior, speech, dress, motor activity, and thought processes Neck - supple, no significant adenopathy Chest - clear to auscultation, no wheezes, rales or rhonchi, symmetric air entry Heart - normal rate, regular rhythm, normal S1, S2, no murmurs, rubs, clicks or gallops Extremities - peripheral pulses normal, no pedal edema, no clubbing or cyanosis Skin: Patient has scattered hypopigmented macular spots on both forearms.  See picture below      Latest Ref Rng & Units 08/12/2023    4:16 PM 04/24/2023   11:12 AM 07/26/2022    3:00 AM  CMP  Glucose 70 - 99 mg/dL 668  825  762   BUN 6 - 20 mg/dL 11  10  10    Creatinine 0.44 - 1.00 mg/dL 9.32  9.31  9.37   Sodium 135 - 145 mmol/L 133  137  135   Potassium 3.5 - 5.1 mmol/L 3.7  4.1  3.6   Chloride 98 - 111 mmol/L 102  102  102   CO2 22 - 32 mmol/L 24  23  25    Calcium  8.9 - 10.3 mg/dL 8.6  8.9  9.0   Total Protein 6.5 - 8.1 g/dL 7.2  7.1  7.0   Total Bilirubin <1.2 mg/dL 0.4  0.3  0.3   Alkaline Phos 38 - 126 U/L 98  121  96   AST 15 -  41 U/L 18  11  16    ALT 0 - 44 U/L 18  11  21     Lipid Panel     Component Value Date/Time   CHOL 144 04/24/2023 1112   TRIG 102 04/24/2023 1112   HDL 46 04/24/2023 1112   CHOLHDL 3.1 04/24/2023 1112   LDLCALC 79 04/24/2023 1112    CBC    Component Value Date/Time   WBC 8.9 08/12/2023 1616   RBC 4.95  08/12/2023 1616   HGB 11.6 (L) 08/12/2023 1616   HGB 11.7 08/21/2022 1054   HGB 9.3 04/02/2015 0000   HCT 36.2 08/12/2023 1616   HCT 37.3 08/21/2022 1054   HCT 32 04/02/2015 0000   PLT 318 08/12/2023 1616   PLT 263 08/21/2022 1054   PLT 334 04/02/2015 0000   MCV 73.1 (L) 08/12/2023 1616   MCV 74 (L) 08/21/2022 1054   MCH 23.4 (L) 08/12/2023 1616   MCHC 32.0 08/12/2023 1616   RDW 13.3 08/12/2023 1616   RDW 14.8 08/21/2022 1054   LYMPHSABS 1.6 08/12/2023 1616   LYMPHSABS 1.7 05/17/2022 0912   MONOABS 0.6 08/12/2023 1616   EOSABS 0.2 08/12/2023 1616   EOSABS 0.2 05/17/2022 0912   BASOSABS 0.0 08/12/2023 1616   BASOSABS 0.0 05/17/2022 0912    ASSESSMENT AND PLAN: 1. Type 2 diabetes mellitus with obesity (HCC) (Primary) Not at goal.  She has been out of the Januvia  50 mg.  She will continue Amaryl  4 mg daily.  Increase Lantus  insulin  to 14 units daily. Had tried to put her on Trulicity  in the past but she was afraid of potential side effects.  Advised that our other option at this time would be mealtime insulin  versus giving a trial of the Trulicity .  Patient was agreeable to trying the Trulicity .  I went over with pt how the medication works and potential side effects including nausea, vomiting, diarrhea/constipation, bowel blockage, palpitations and pancreatitis.  Advised to stop the medicine and be seen if pt develops any abdominal pain, vomiting, severe diarrhea/constipation or palpitations. We will start her on Trulicity  0.75 mg once a week.  Pharmacist to show her how to administer the medicine once it is dispensed. - POCT glycosylated hemoglobin (Hb A1C) - POCT glucose (manual entry) - TRUEplus Lancets 28G MISC; Use to check blood sugar up to 3 times daily.  Dispense: 100 each; Refill: 2 - glimepiride  (AMARYL ) 2 MG tablet; Take 2 tablets (4 mg total) by mouth daily.  Dispense: 180 tablet; Refill: 1 - Dulaglutide  (TRULICITY ) 0.75 MG/0.5ML SOAJ; Inject 0.75 mg into the skin once a  week.  Dispense: 2 mL; Refill: 4 - insulin  glargine (LANTUS  SOLOSTAR) 100 UNIT/ML Solostar Pen; Inject 14 Units into the skin daily.  Dispense: 15 mL; Refill: PRN  2. Diabetes mellitus treated with oral medication (HCC) See #1 above.  Discussed and encouraged healthy eating habits  3. Insulin  long-term use (HCC) See #1 above  4. Hyperlipidemia associated with type 2 diabetes mellitus (HCC) Continue atorvastatin  10 mg daily  5. Acute vaginitis Advised patient that recurrent vaginal yeast infection could be due to uncontrolled diabetes underscoring the importance of getting the blood sugars under better control - Cervicovaginal ancillary only - nystatin -triamcinolone  ointment (MYCOLOG); Apply 1 Application topically daily as needed.  Dispense: 15 g; Refill: 0 - fluconazole  (DIFLUCAN ) 150 MG tablet; 1 tab PO Q wk x 4 wks  Dispense: 4 tablet; Refill: 0  6. Dermatitis Looks like this may be vitiligo.  Advised to apply  for the [facility] discount card.  Will refer to dermatology. - Ambulatory referral to Dermatology  Patient was given the opportunity to ask questions.  Patient verbalized understanding of the plan and was able to repeat key elements of the plan.   This documentation was completed using Paediatric nurse.  Any transcriptional errors are unintentional.  Orders Placed This Encounter  Procedures   Ambulatory referral to Dermatology   POCT glycosylated hemoglobin (Hb A1C)   POCT glucose (manual entry)     Requested Prescriptions   Signed Prescriptions Disp Refills   TRUEplus Lancets 28G MISC 100 each 2    Sig: Use to check blood sugar up to 3 times daily.   glimepiride  (AMARYL ) 2 MG tablet 180 tablet 1    Sig: Take 2 tablets (4 mg total) by mouth daily.   Dulaglutide  (TRULICITY ) 0.75 MG/0.5ML SOAJ 2 mL 4    Sig: Inject 0.75 mg into the skin once a week.   insulin  glargine (LANTUS  SOLOSTAR) 100 UNIT/ML Solostar Pen 15 mL PRN    Sig: Inject 14 Units into the  skin daily.   nystatin -triamcinolone  ointment (MYCOLOG) 30 g 0    Sig: Apply 1 Application topically daily as needed.   fluconazole  (DIFLUCAN ) 150 MG tablet 4 tablet 0    Sig: Take 1 tablet (150 mg total) by mouth once a week for 4 weeks    Return in about 4 months (around 06/28/2024).  Barnie Louder, MD, FACP

## 2024-02-26 NOTE — Telephone Encounter (Signed)
 Transportation Voucher completed.   Pick up date and time: 02/26/2024 10:42 am   Pick up address: [address] [facility] [address] [address]  Drop off address: [address] [place] [address]

## 2024-02-27 LAB — CERVICOVAGINAL ANCILLARY ONLY
Bacterial Vaginitis (gardnerella): NEGATIVE
Candida Glabrata: NEGATIVE
Candida Vaginitis: NEGATIVE
Chlamydia: NEGATIVE
Comment: NEGATIVE
Comment: NEGATIVE
Comment: NEGATIVE
Comment: NEGATIVE
Comment: NEGATIVE
Comment: NORMAL
Neisseria Gonorrhea: NEGATIVE
Trichomonas: NEGATIVE

## 2024-02-28 ENCOUNTER — Ambulatory Visit: Payer: Self-pay | Admitting: Internal Medicine

## 2024-03-04 ENCOUNTER — Telehealth: Payer: Self-pay

## 2024-03-04 NOTE — Telephone Encounter (Signed)
 Call placed to patient using [employer] interpreters. Left VM with instructions to refrigerate the Trulicity  that was prescribed by Dr. Vicci last week on 02/26/2024. Also advised to store unused insulin  pens in the fridge.

## 2024-03-04 NOTE — Telephone Encounter (Signed)
  FYI Only or Action Required?: FYI only for provider.  Patient was last seen in primary care on 02/26/2024 by Vicci Barnie NOVAK, MD.  Called Nurse Triage reporting Medication Management.  Symptoms began today.  Interventions attempted: Nothing.  Symptoms are: stable.  Triage Disposition: No disposition on file.  Patient/caregiver understands and will follow disposition?:     Wanted to know if insulin  should be refrigerated. This nurse able to answer question.  Instructed to keep it in refrigerator.      Copied from CRM [id number]. Topic: Clinical - Medication Question >> Mar 04, 2024 12:34 PM Turkey B wrote: Reason for CRM: pt want to know should she keep her insulin  in the fridge or leave it out

## 2024-03-04 NOTE — Telephone Encounter (Signed)
 Copied from CRM [id number]. Topic: Clinical - Request for Lab/Test Order >> Mar 04, 2024 12:33 PM Turkey B wrote: Reason for CRM: pt called in. I gave results

## 2024-03-05 NOTE — Telephone Encounter (Signed)
 Noted! Thank you

## 2024-03-11 ENCOUNTER — Ambulatory Visit (HOSPITAL_COMMUNITY)
Admission: EM | Admit: 2024-03-11 | Discharge: 2024-03-11 | Disposition: A | Discharge status: Home / Self Care | Payer: Self-pay | Attending: Emergency Medicine | Admitting: Emergency Medicine

## 2024-03-11 ENCOUNTER — Encounter (HOSPITAL_COMMUNITY): Payer: Self-pay | Admitting: *Deleted

## 2024-03-11 ENCOUNTER — Other Ambulatory Visit: Payer: Self-pay

## 2024-03-11 DIAGNOSIS — H00022 Hordeolum internum right lower eyelid: Secondary | ICD-10-CM

## 2024-03-11 DIAGNOSIS — H66001 Acute suppurative otitis media without spontaneous rupture of ear drum, right ear: Secondary | ICD-10-CM

## 2024-03-11 DIAGNOSIS — H60391 Other infective otitis externa, right ear: Secondary | ICD-10-CM

## 2024-03-11 MED ORDER — CLINDAMYCIN HCL 300 MG PO CAPS: 300.0000 mg | ORAL_CAPSULE | Freq: Three times a day (TID) | ORAL | 0 refills | Status: AC

## 2024-03-11 MED ORDER — ERYTHROMYCIN 5 MG/GM OP OINT: 1.0000 | TOPICAL_OINTMENT | Freq: Four times a day (QID) | OPHTHALMIC | 0 refills | Status: AC

## 2024-03-11 MED ORDER — OFLOXACIN 0.3 % OT SOLN: 5.0000 [drp] | Freq: Two times a day (BID) | OTIC | 0 refills | Status: DC

## 2024-03-11 NOTE — Discharge Instructions (Addendum)
 Starting clindamycin  3 times daily for 7 days for ear infection. Also apply 5 drops of ofloxacin  twice daily to your right ear over the next 5 days for additional ear infection coverage.  Apply erythromycin  ointment 4 times daily for 5 days as instructed to your right eye. Apply warm compresses to your eyes to help promote drainage and decrease swelling.  Alternate between Tylenol  and ibuprofen  every 6-8 hours as needed for any pain. Follow-up with your primary care provider or return here as needed.  Comenzar con clindamicina 3 veces al da durante 7 das para la infeccin de odo. Tambin aplique 5 gotas de [employer] al da en el odo derecho durante los siguientes 5 das para una mayor cobertura de la infeccin.  Aplique ungento de eritromicina 4 veces al da durante 5 [address]  [address], segn las indicaciones, en el ojo derecho. Aplique compresas tibias en los ojos para facilitar el drenaje y disminuir la inflamacin.  Alterne entre Tylenol  e ibuprofeno cada 6-8 horas segn sea necesario para el dolor. Consulte con su mdico de cabecera o regrese aqu segn sea necesario.

## 2024-03-11 NOTE — ED Triage Notes (Signed)
 PT reports RT ear pain, a bump on rt eye and HA on the Rt side[place] PT took tylenol  599m for pain with out relief.

## 2024-03-11 NOTE — ED Provider Notes (Signed)
 [facility]    CSN: [id number] Arrival date & time: 03/11/24  1908      History   Chief Complaint Chief Complaint  Patient presents with   Eye Pain   Otalgia   Headache    HPI Morgan Ramsey is a 48 y.o. female.   Patient presents with right ear pain that began about 2 weeks ago after [place].  Patient states that the pain has progressively worsened over the last 2 weeks and she began to have a right-sided headache 2 days ago.  Patient states that she has also had some intermittent chills and congestion.  Denies fever, body aches, sore throat, and cough.  Patient reports that she noticed a bump to her right lower eyelid a few days ago as well.  Patient states that the bump is tender and is irritating to her eye.  Denies blurred vision, photophobia, foreign body sensation, or drainage from the eye.  Patient states that she has been taking Tylenol  without relief.  The history is provided by the patient and medical records. The history is limited by a language barrier. No language interpreter was used (Declined interpreter, daughter is translating).  Eye Pain Associated symptoms include headaches.  Otalgia Associated symptoms: headaches   Headache Associated symptoms: ear pain and eye pain     Past Medical History:  Diagnosis Date   Allergy    pollen   AMA (advanced maternal age) multigravida 35+    Anemia    Diabetes mellitus without complication (HCC)    Gestational diabetes 2017   glyburide    Herpes    History of candidal vulvovaginitis    History of chlamydia 2008   Hyperlipidemia    Obese     Patient Active Problem List   Diagnosis Date Noted   Vitamin D  deficiency 08/24/2023   Iron  deficiency 08/24/2023   Menorrhagia with regular cycle 08/24/2023   Sepsis secondary to UTI (HCC) 07/24/2022   Type 2 diabetes mellitus with obesity (HCC) 07/24/2022   Class 1 obesity due to excess calories with body mass index (BMI) of 34.0  to 34.9 in adult 07/24/2022   Pap smear of cervix shows high risk HPV present 11/15/2020   Influenza vaccine refused 07/16/2020   Hyperlipidemia associated with type 2 diabetes mellitus (HCC) 06/09/2020   Statins contraindicated 06/09/2020   Microalbuminuria due to type 2 diabetes mellitus (HCC) 06/09/2020   Type 2 diabetes mellitus with obesity (HCC) 06/04/2020   Polyhydramnios, antepartum complication 08/17/2019   Polyhydramnios in third trimester 08/13/2019   Unstable lie of fetus 08/06/2019   Obesity (BMI 30.0-34.9) 05/07/2019   Transient hypertension of pregnancy 05/07/2019   Language barrier 04/26/2015   H/O herpes simplex type 2 infection 04/26/2015   Previous cesarean section 04/26/2015   Obesity in pregnancy 04/26/2015   Anemia 04/26/2015    Past Surgical History:  Procedure Laterality Date   CESAREAN SECTION  05/21/2011   Procedure: CESAREAN SECTION;  Surgeon: Norleen LULLA Server, MD;  Location: [facility];  Service: Gynecology;  Laterality: N/A;  Primary cesarean section with delivery of baby girl at 40. Apgars 9/9.   HERPES SIMPLEX VIRUS DFA     last outbreak prior to 2017      Home Medications    Prior to Admission medications   Medication Sig Start Date End Date Taking? Authorizing Provider  glimepiride  (AMARYL ) 2 MG tablet Take 2 tablets (4 mg total) by mouth daily. 02/26/24  Yes Vicci Barnie NOVAK, MD  glucose blood (TRUE METRIX BLOOD GLUCOSE TEST) test strip Use to check blood sugar up to 3 times daily. 08/24/23  Yes Vicci Barnie NOVAK, MD  insulin  glargine (LANTUS  SOLOSTAR) 100 UNIT/ML Solostar Pen Inject 14 Units into the skin daily. 02/26/24  Yes Vicci Barnie NOVAK, MD  Insulin  Pen Needle (PEN NEEDLES) 31G X 8 MM MISC USE AS DIRECTED BY PHYSICIAN. 08/24/23  Yes Vicci Barnie NOVAK, MD  Iron , Ferrous Sulfate , 325 (65 Fe) MG TABS Take 1 tablet by mouth every other day. 08/14/23  Yes Mayers, Cari S, PA-C  TRUEplus Lancets 28G MISC Use to check blood sugar up to 3 times  daily. 02/26/24  Yes Vicci Barnie NOVAK, MD  Acetaminophen  (TYLENOL  PO) Take by mouth.    [provider]  clindamycin  (CLEOCIN ) 300 MG capsule Take 1 capsule (300 mg total) by mouth 3 (three) times daily for 7 days. 03/11/24 03/18/24 Yes Johnie Flaming A, NP  erythromycin  ophthalmic ointment Place 1 Application into the right eye 4 (four) times daily for 5 days. Place a 1/2 inch ribbon of ointment into the lower eyelid. 03/11/24 03/16/24 Yes Giancarlo Askren A, NP  ofloxacin  (FLOXIN ) 0.3 % OTIC solution Place 5 drops into the right ear 2 (two) times daily. 03/11/24  Yes Johnie, Rylend Pietrzak A, NP  Vitamin D , Ergocalciferol , (DRISDOL ) 1.25 MG (50000 UNIT) CAPS capsule Take 1 capsule (50,000 Units total) by mouth every 7 (seven) days. 08/14/23   Mayers, Cari S, PA-C  metFORMIN  (GLUCOPHAGE ) 1000 MG tablet Take 1 tablet (1,000 mg total) by mouth 2 (two) times daily with a meal. 07/16/20 09/14/20  Vicci Barnie NOVAK, MD    Family History Family History  Problem Relation Age of Onset   Diabetes Mother        oral agents   Hypertension Mother    Hypertension Father    Hypertension Brother    Breast cancer Cousin    Anesthesia problems Neg Hx    Hypotension Neg Hx    Malignant hyperthermia Neg Hx    Pseudochol deficiency Neg Hx    Colon cancer Neg Hx    Colon polyps Neg Hx    Crohn's disease Neg Hx    Esophageal cancer Neg Hx    Stomach cancer Neg Hx    Rectal cancer Neg Hx    Ulcerative colitis Neg Hx     Social History Social History   Tobacco Use   Smoking status: Never    Passive exposure: Past (co-wokers 10 years ago)   Smokeless tobacco: Never  Vaping Use   Vaping status: Never Used  Substance Use Topics   Alcohol use: No   Drug use: Never     Allergies   Keflex  [cephalexin ] and Metformin  and related   Review of Systems Review of Systems  HENT:  Positive for ear pain.   Eyes:  Positive for pain.  Neurological:  Positive for headaches.   Per HPI  Physical  Exam Triage Vital Signs ED Triage Vitals  Encounter Vitals Group     BP 03/11/24 1930 133/84     Girls Systolic BP Percentile --      Girls Diastolic BP Percentile --      Boys Systolic BP Percentile --      Boys Diastolic BP Percentile --      Pulse Rate 03/11/24 1930 86     Resp 03/11/24 1930 18     Temp 03/11/24 1930 (!) 97.5 F (36.4 C)     Temp src --  SpO2 03/11/24 1930 96 %     Weight --      Height --      Head Circumference --      Peak Flow --      Pain Score 03/11/24 1926 6     Pain Loc --      Pain Education --      Exclude from Growth Chart --    No data found.  Updated Vital Signs BP 133/84   Pulse 86   Temp (!) 97.5 F (36.4 C)   Resp 18   LMP 01/27/2024 (Approximate)   SpO2 96%   Visual Acuity Right Eye Distance:   Left Eye Distance:   Bilateral Distance:    Right Eye Near:   Left Eye Near:    Bilateral Near:     Physical Exam Vitals and nursing note reviewed.  Constitutional:      General: She is awake. She is not in acute distress.    Appearance: Normal appearance. She is well-developed and well-groomed. She is not ill-appearing.  HENT:     Right Ear: External ear normal. Swelling and tenderness present. A middle ear effusion is present. Tympanic membrane is erythematous.     Left Ear: Tympanic membrane, ear canal and external ear normal.     Nose: Congestion and rhinorrhea present.     Mouth/Throat:     Mouth: Mucous membranes are moist.     Pharynx: Posterior oropharyngeal erythema present.  Eyes:     General:        Right eye: Hordeolum present.     Conjunctiva/sclera: Conjunctivae normal.     Comments: Internal hordeolum noted to right lower eyelid  Cardiovascular:     Rate and Rhythm: Normal rate and regular rhythm.  Pulmonary:     Effort: Pulmonary effort is normal.     Breath sounds: Normal breath sounds.  Skin:    General: Skin is warm and dry.  Neurological:     Mental Status: She is alert.  Psychiatric:         Behavior: Behavior is cooperative.      UC Treatments / Results  Labs (all labs ordered are listed, but only abnormal results are displayed) Labs Reviewed - No data to display  EKG   Radiology No results found.  Procedures Procedures (including critical care time)  Medications Ordered in UC Medications - No data to display  Initial Impression / Assessment and Plan / UC Course  I have reviewed the triage vital signs and the nursing notes.  Pertinent labs & imaging results that were available during my care of the patient were reviewed by me and considered in my medical decision making (see chart for details).     Patient is overall well-appearing.  Vitals are stable.  Exam findings concerning for otitis media and otitis externa.  Prescribed clindamycin  due to cephalosporin allergy and unknown penicillin allergy for otitis media coverage.  Prescribed ofloxacin  eardrops for otitis externa coverage.  Prescribed erythromycin  ointment due to irritation related to hordeolum.  Recommended warm compresses for additional relief of this.  Recommended Tylenol  and ibuprofen  as needed for pain.  Discussed follow-up and return precautions. Final Clinical Impressions(s) / UC Diagnoses   Final diagnoses:  Non-recurrent acute suppurative otitis media of right ear without spontaneous rupture of tympanic membrane  Infective otitis externa of right ear  Hordeolum internum of right lower eyelid     Discharge Instructions      Starting clindamycin  3 times daily  for 7 days for ear infection. Also apply 5 drops of ofloxacin  twice daily to your right ear over the next 5 days for additional ear infection coverage.  Apply erythromycin  ointment 4 times daily for 5 days as instructed to your right eye. Apply warm compresses to your eyes to help promote drainage and decrease swelling.  Alternate between Tylenol  and ibuprofen  every 6-8 hours as needed for any pain. Follow-up with your primary care  provider or return here as needed.  Comenzar con clindamicina 3 veces al da durante 7 das para la infeccin de odo. Tambin aplique 5 gotas de [employer] al da en el odo derecho durante los siguientes 5 das para una mayor cobertura de la infeccin.  Aplique ungento de eritromicina 4 veces al da durante 5 [address]  [address], segn las indicaciones, en el ojo derecho. Aplique compresas tibias en los ojos para facilitar el drenaje y disminuir la inflamacin.  Alterne entre Tylenol  e ibuprofeno cada 6-8 horas segn sea necesario para el dolor. Consulte con su mdico de cabecera o regrese aqu segn sea necesario.   ED Prescriptions     Medication Sig Dispense Auth. Provider   ofloxacin  (FLOXIN ) 0.3 % OTIC solution Place 5 drops into the right ear 2 (two) times daily. 5 mL Johnie Flaming A, NP   erythromycin  ophthalmic ointment Place 1 Application into the right eye 4 (four) times daily for 5 days. Place a 1/2 inch ribbon of ointment into the lower eyelid. 3.5 g Johnie Flaming A, NP   clindamycin  (CLEOCIN ) 300 MG capsule Take 1 capsule (300 mg total) by mouth 3 (three) times daily for 7 days. 21 capsule Johnie Flaming A, NP      PDMP not reviewed this encounter.   Johnie Flaming A, NP 03/11/24 [phone]

## 2024-03-17 ENCOUNTER — Other Ambulatory Visit: Payer: Self-pay

## 2024-03-20 ENCOUNTER — Ambulatory Visit (HOSPITAL_COMMUNITY)
Admission: EM | Admit: 2024-03-20 | Discharge: 2024-03-20 | Disposition: A | Discharge status: Home / Self Care | Payer: Self-pay | Attending: Family Medicine | Admitting: Family Medicine

## 2024-03-20 ENCOUNTER — Encounter (HOSPITAL_COMMUNITY): Payer: Self-pay | Admitting: *Deleted

## 2024-03-20 ENCOUNTER — Telehealth: Payer: Self-pay | Admitting: Physician Assistant

## 2024-03-20 ENCOUNTER — Ambulatory Visit: Payer: Self-pay

## 2024-03-20 DIAGNOSIS — R197 Diarrhea, unspecified: Secondary | ICD-10-CM | POA: Insufficient documentation

## 2024-03-20 DIAGNOSIS — R109 Unspecified abdominal pain: Secondary | ICD-10-CM

## 2024-03-20 LAB — COMPREHENSIVE METABOLIC PANEL WITH GFR
ALT: 25 U/L (ref 0–44)
AST: 21 U/L (ref 15–41)
Albumin: 3.1 g/dL — ABNORMAL LOW (ref 3.5–5.0)
Alkaline Phosphatase: 113 U/L (ref 38–126)
Anion gap: 9 (ref 5–15)
BUN: 12 mg/dL (ref 6–20)
CO2: 21 mmol/L — ABNORMAL LOW (ref 22–32)
Calcium: 8.7 mg/dL — ABNORMAL LOW (ref 8.9–10.3)
Chloride: 103 mmol/L (ref 98–111)
Creatinine, Ser: 0.86 mg/dL (ref 0.44–1.00)
GFR, Estimated: >60 mL/min (ref >60)
Glucose, Bld: 309 mg/dL — ABNORMAL HIGH (ref 70–99)
Potassium: 3.6 mmol/L (ref 3.5–5.1)
Sodium: 133 mmol/L — ABNORMAL LOW (ref 135–145)
Total Bilirubin: 0.4 mg/dL (ref 0.0–1.2)
Total Protein: 7 g/dL (ref 6.5–8.1)

## 2024-03-20 LAB — CBC
HCT: 37.9 % (ref 36.0–46.0)
Hemoglobin: 12.7 g/dL (ref 12.0–15.0)
MCH: 25.2 pg — ABNORMAL LOW (ref 26.0–34.0)
MCHC: 33.5 g/dL (ref 30.0–36.0)
MCV: 75.2 fL — ABNORMAL LOW (ref 80.0–100.0)
Platelets: 321 K/uL (ref 150–400)
RBC: 5.04 MIL/uL (ref 3.87–5.11)
RDW: 13.5 % (ref 11.5–15.5)
WBC: 7.7 K/uL (ref 4.0–10.5)
nRBC: 0 % (ref 0.0–0.2)

## 2024-03-20 NOTE — Progress Notes (Signed)
 Virtual Visit Consent   Morgan Ramsey, you are scheduled for a virtual visit with a [facility] provider today. Just as with appointments in the office, your consent must be obtained to participate. Your consent will be active for this visit and any virtual visit you may have with one of our providers in the next 365 days. If you have a MyChart account, a copy of this consent can be sent to you electronically.  As this is a virtual visit, video technology does not allow for your provider to perform a traditional examination. This may limit your provider's ability to fully assess your condition. If your provider identifies any concerns that need to be evaluated in person or the need to arrange testing (such as labs, EKG, etc.), we will make arrangements to do so. Although advances in technology are sophisticated, we cannot ensure that it will always work on either your end or our end. If the connection with a video visit is poor, the visit may have to be switched to a telephone visit. With either a video or telephone visit, we are not always able to ensure that we have a secure connection.  By engaging in this virtual visit, you consent to the provision of healthcare and authorize for your insurance to be billed (if applicable) for the services provided during this visit. Depending on your insurance coverage, you may receive a charge related to this service.  I need to obtain your verbal consent now. Are you willing to proceed with your visit today? Morgan Ramsey has provided verbal consent on 03/20/2024 for a virtual visit (video or telephone). Morgan Ramsey, [place]  Date: 03/20/2024 3:43 PM   Virtual Visit via Video Note   I, Morgan Ramsey, connected with  Morgan Ramsey  ([id number], Apr 28, 1976) on 03/20/24 at  3:30 PM EDT by a video-enabled telemedicine application and verified that I am speaking with the correct person using two identifiers.  Location: Patient: Virtual Visit  Location Patient: Home Provider: Virtual Visit Location Provider: Home Office   I discussed the limitations of evaluation and management by telemedicine and the availability of in person appointments. The patient expressed understanding and agreed to proceed.    History of Present Illness: Morgan Ramsey is a 48 y.o. who identifies as a female who was assigned female at birth, and is being seen today for ongoing ear and eye pain. She was prescribed antibiotic. Since that time she has had ongoing diarrhea. She discontinued the antibiotic. She state she even has to get up in the middle of the night due to diarrhea. She states she has been having 3-5 episodes of diarrhea.   HPI: HPI  Problems:  Patient Active Problem List   Diagnosis Date Noted   Vitamin D  deficiency 08/24/2023   Iron  deficiency 08/24/2023   Menorrhagia with regular cycle 08/24/2023   Sepsis secondary to UTI (HCC) 07/24/2022   Type 2 diabetes mellitus with obesity (HCC) 07/24/2022   Class 1 obesity due to excess calories with body mass index (BMI) of 34.0 to 34.9 in adult 07/24/2022   Pap smear of cervix shows high risk HPV present 11/15/2020   Influenza vaccine refused 07/16/2020   Hyperlipidemia associated with type 2 diabetes mellitus (HCC) 06/09/2020   Statins contraindicated 06/09/2020   Microalbuminuria due to type 2 diabetes mellitus (HCC) 06/09/2020   Type 2 diabetes mellitus with obesity (HCC) 06/04/2020   Polyhydramnios, antepartum complication 08/17/2019   Polyhydramnios in third trimester 08/13/2019   Unstable lie of fetus 08/06/2019  Obesity (BMI 30.0-34.9) 05/07/2019   Transient hypertension of pregnancy 05/07/2019   Language barrier 04/26/2015   H/O herpes simplex type 2 infection 04/26/2015   Previous cesarean section 04/26/2015   Obesity in pregnancy 04/26/2015   Anemia 04/26/2015    Allergies:  Allergies  Allergen Reactions   Keflex  [Cephalexin ]     Chest tightness and SOB when prescribed  01/04/22 for strep throat   Metformin  And Related     Diarrhea   Medications:  Current Outpatient Medications:    Acetaminophen  (TYLENOL  PO), Take by mouth., Disp: , Rfl:    glimepiride  (AMARYL ) 2 MG tablet, Take 2 tablets (4 mg total) by mouth daily., Disp: 180 tablet, Rfl: 1   glucose blood (TRUE METRIX BLOOD GLUCOSE TEST) test strip, Use to check blood sugar up to 3 times daily., Disp: 100 each, Rfl: 2   insulin  glargine (LANTUS  SOLOSTAR) 100 UNIT/ML Solostar Pen, Inject 14 Units into the skin daily., Disp: 15 mL, Rfl: PRN   Insulin  Pen Needle (PEN NEEDLES) 31G X 8 MM MISC, USE AS DIRECTED BY PHYSICIAN., Disp: 100 each, Rfl: 6   Iron , Ferrous Sulfate , 325 (65 Fe) MG TABS, Take 1 tablet by mouth every other day., Disp: 100 tablet, Rfl: 1   ofloxacin  (FLOXIN ) 0.3 % OTIC solution, Place 5 drops into the right ear 2 (two) times daily., Disp: 5 mL, Rfl: 0   TRUEplus Lancets 28G MISC, Use to check blood sugar up to 3 times daily., Disp: 100 each, Rfl: 2   Vitamin D , Ergocalciferol , (DRISDOL ) 1.25 MG (50000 UNIT) CAPS capsule, Take 1 capsule (50,000 Units total) by mouth every 7 (seven) days., Disp: 4 capsule, Rfl: 2  Observations/Objective: Patient is well-developed, well-nourished in no acute distress.  Resting comfortably  at home.  Head is normocephalic, atraumatic.  No labored breathing.  Speech is clear and coherent with logical content.  Patient is alert and oriented at baseline.    Assessment and Plan: 1. Diarrhea, unspecified type (Primary)  Patient with worsening diarrhea. I am concerned that it could be c diff. I recommended an in person evaluation to assess for the cause and for a proper abdominal exam and labs. Patient agreed to this plan. Interpreter services were utilized for this appointment.   Follow Up Instructions: I discussed the assessment and treatment plan with the patient. The patient was provided an opportunity to ask questions and all were answered. The patient  agreed with the plan and demonstrated an understanding of the instructions.  A copy of instructions were sent to the patient via MyChart unless otherwise noted below.    The patient was advised to call back or seek an in-person evaluation if the symptoms worsen or if the condition fails to improve as anticipated.    Morgan Shuck, PA-C

## 2024-03-20 NOTE — ED Notes (Signed)
 Pt given container and supplies to collect stool studies and return with labels.

## 2024-03-20 NOTE — Telephone Encounter (Signed)
 FYI Only or Action Required?: FYI only for provider.  Patient was last seen in primary care on 02/26/2024 by Vicci Barnie NOVAK, MD.  Called Nurse Triage reporting Diarrhea.  Symptoms began a week ago.  Interventions attempted: Nothing.  Symptoms are: unchanged.  Triage Disposition: See PCP When Office is Open (Within 3 Days)  Patient/caregiver understands and will follow disposition?: Yes  Using [employer] marico PI#[id number].    Pt states that she only took the antibiotic for two days and then stopped.    Copied from CRM #[id number]. Topic: Clinical - Red Word Triage >> Mar 20, 2024  2:10 PM Zebedee SAUNDERS wrote: Red Word that prompted transfer to Nurse Triage: Pt went to urgent care a week ago were they gave pt antibiotics for ear infection. But pt started with diarrhea and stopped taking antibiotics but the diarrhea persist. Reason for Disposition  Diarrhea continues for > 3 days after stopping the antibiotic  Answer Assessment - Initial Assessment Questions 1. ANTIBIOTIC: What antibiotic are you taking? How many times per day?     Clindamycin  tid 2. ANTIBIOTIC ONSET: When was the antibiotic started?     Wednesday of last week  3. DIARRHEA SEVERITY: How bad is the diarrhea? How many more stools have you had in the past 24 hours than normal?      5-6 in last 24 hrs 4. ONSET: When did the diarrhea begin?     Last Thursday morning 5. BM CONSISTENCY: How loose or watery is the diarrhea?      Some days more watery  6. VOMITING: Are you also vomiting? If Yes, ask: How many times in the past 24 hours?      no 7. ABDOMEN PAIN: Are you having any abdomen pain? If Yes, ask: What does it feel like? (e.g., crampy, dull, intermittent, constant)      intermittent 8. ABDOMEN PAIN SEVERITY: If present, ask: How bad is the pain?  (e.g., Scale 1-10; mild, moderate, or severe)     Crampy-mild 9. ORAL INTAKE: If vomiting, Have you been able to drink liquids? How  much liquids have you had in the past 24 hours?     Yes plenty of water 10. HYDRATION: Any signs of dehydration? (e.g., dry mouth [not just dry lips], too weak to stand, dizziness, new weight loss) When did you last urinate?       No just dryness in mouth at night 11. EXPOSURE: Have you traveled to a foreign country recently? Have you been exposed to anyone with diarrhea? Could you have eaten any food that was spoiled?       no 12. OTHER SYMPTOMS: Do you have any other symptoms? (e.g., fever, blood in stool)       Gas and abd making noise when laying on left side  Protocols used: Diarrhea on Antibiotics-A-AH

## 2024-03-20 NOTE — Patient Instructions (Signed)
  Morgan Ramsey, thank you for joining Teena Shuck, PA-C for today's virtual visit.  While this provider is not your primary care provider (PCP), if your PCP is located in our provider database this encounter information will be shared with them immediately following your visit.   A [facility] MyChart account gives you access to today's visit and all your visits, tests, and labs performed at [facility]  click here if you don't have a [facility] MyChart account or go to [email or web address]  Consent: (Patient) Morgan Ramsey provided verbal consent for this virtual visit at the beginning of the encounter.  Current Medications:  Current Outpatient Medications:    Acetaminophen  (TYLENOL  PO), Take by mouth., Disp: , Rfl:    glimepiride  (AMARYL ) 2 MG tablet, Take 2 tablets (4 mg total) by mouth daily., Disp: 180 tablet, Rfl: 1   glucose blood (TRUE METRIX BLOOD GLUCOSE TEST) test strip, Use to check blood sugar up to 3 times daily., Disp: 100 each, Rfl: 2   insulin  glargine (LANTUS  SOLOSTAR) 100 UNIT/ML Solostar Pen, Inject 14 Units into the skin daily., Disp: 15 mL, Rfl: PRN   Insulin  Pen Needle (PEN NEEDLES) 31G X 8 MM MISC, USE AS DIRECTED BY PHYSICIAN., Disp: 100 each, Rfl: 6   Iron , Ferrous Sulfate , 325 (65 Fe) MG TABS, Take 1 tablet by mouth every other day., Disp: 100 tablet, Rfl: 1   ofloxacin  (FLOXIN ) 0.3 % OTIC solution, Place 5 drops into the right ear 2 (two) times daily., Disp: 5 mL, Rfl: 0   TRUEplus Lancets 28G MISC, Use to check blood sugar up to 3 times daily., Disp: 100 each, Rfl: 2   Vitamin D , Ergocalciferol , (DRISDOL ) 1.25 MG (50000 UNIT) CAPS capsule, Take 1 capsule (50,000 Units total) by mouth every 7 (seven) days., Disp: 4 capsule, Rfl: 2   Medications ordered in this encounter:  No orders of the defined types were placed in this encounter.    *If you need refills on other medications prior to your next appointment, please contact your  pharmacy*  Follow-Up: Call back or seek an in-person evaluation if the symptoms worsen or if the condition fails to improve as anticipated.  [facility] [phone]  Other Instructions Report to nearest UC or ER for evaluation.   If you have been instructed to have an in-person evaluation today at a local Urgent Care facility, please use the link below. It will take you to a list of all of our available [facility], including address, phone number and hours of operation. Please do not delay care.  [facility]  If you or a family member do not have a primary care provider, use the link below to schedule a visit and establish care. When you choose a [facility] primary care physician or advanced practice provider, you gain a long-term partner in health. Find a Primary Care Provider  Learn more about [facility]'s in-office and virtual care options: [facility] - Get Care Now

## 2024-03-20 NOTE — Discharge Instructions (Signed)
 We have drawn blood to check your blood counts and electrolytes and kidney and liver function.  Staff will notify you if anything is significantly abnormal.  Please bring the stool specimen back to this clinic.  We are open 8-8 on Monday through Friday and 10-6 on Saturday and Sunday.  If you start feeling weaker or have more frequent loose stools, please consider going to emergency room for further evaluation

## 2024-03-20 NOTE — ED Triage Notes (Signed)
 Professional interpreter used for clinical intake.    Pt states she is here today due to diarrhea from her antibiotic that was given at the last visit. She did not finished the meds she only took it two days. She did do a video visit today and was advised to come in office.   She states her right ear still feels inflamed today, which is causing headache. She has been using ofloxacin  ear drops without relief.

## 2024-03-20 NOTE — ED Provider Notes (Addendum)
 [facility]    CSN: [id number] Arrival date & time: 03/20/24  1825      History   Chief Complaint Chief Complaint  Patient presents with   Otalgia   Headache   Diarrhea    HPI Morgan Ramsey is a 48 y.o. female.    Otalgia Associated symptoms: diarrhea and headaches   Headache Associated symptoms: diarrhea and ear pain   Diarrhea Associated symptoms: headaches   Here for diarrhea and headache and intermittent abdominal cramping.  She was seen here on July 29 and prescribed clindamycin  for cellulitis around her ear and for hordeolum.  She was also prescribed Floxin  drops.  She did have a Keflex  and a possible penicillin allergy and that is why she was prescribed clindamycin .  She took 2 days of the clindamycin  and then had to stop it on the 31st due to the diarrhea.  The diarrhea has persisted and she is having a loose watery bowel movement 5-8 times in 24 hours.  Maybe some subjective fever recently.  She felt a little fatigued or dizzy earlier today.  No nausea or vomiting.  Her ear does feel better and she is about to finish her drops.( At triage staff understood her to say her ear was still inflamed, but when I asked how the ear was feeling, she said it wasn't hurting anymore)  The hordeolum is also decreased in size  Past medical history significant for diabetes and her sugars are running in the low to mid 100s.   Last menstrual cycle was July 15  Past Medical History:  Diagnosis Date   Allergy    pollen   AMA (advanced maternal age) multigravida 35+    Anemia    Diabetes mellitus without complication (HCC)    Gestational diabetes 2017   glyburide    Herpes    History of candidal vulvovaginitis    History of chlamydia 2008   Hyperlipidemia    Obese     Patient Active Problem List   Diagnosis Date Noted   Vitamin D  deficiency 08/24/2023   Iron  deficiency 08/24/2023   Menorrhagia with regular cycle 08/24/2023   Sepsis secondary to  UTI (HCC) 07/24/2022   Type 2 diabetes mellitus with obesity (HCC) 07/24/2022   Class 1 obesity due to excess calories with body mass index (BMI) of 34.0 to 34.9 in adult 07/24/2022   Pap smear of cervix shows high risk HPV present 11/15/2020   Influenza vaccine refused 07/16/2020   Hyperlipidemia associated with type 2 diabetes mellitus (HCC) 06/09/2020   Statins contraindicated 06/09/2020   Microalbuminuria due to type 2 diabetes mellitus (HCC) 06/09/2020   Type 2 diabetes mellitus with obesity (HCC) 06/04/2020   Polyhydramnios, antepartum complication 08/17/2019   Polyhydramnios in third trimester 08/13/2019   Unstable lie of fetus 08/06/2019   Obesity (BMI 30.0-34.9) 05/07/2019   Transient hypertension of pregnancy 05/07/2019   Language barrier 04/26/2015   H/O herpes simplex type 2 infection 04/26/2015   Previous cesarean section 04/26/2015   Obesity in pregnancy 04/26/2015   Anemia 04/26/2015    Past Surgical History:  Procedure Laterality Date   CESAREAN SECTION  05/21/2011   Procedure: CESAREAN SECTION;  Surgeon: Norleen LULLA Server, MD;  Location: [facility];  Service: Gynecology;  Laterality: N/A;  Primary cesarean section with delivery of baby girl at 67. Apgars 9/9.   HERPES SIMPLEX VIRUS DFA     last outbreak prior to 2017        Home Medications  Prior to Admission medications   Medication Sig Start Date End Date Taking? Authorizing Provider  glimepiride  (AMARYL ) 2 MG tablet Take 2 tablets (4 mg total) by mouth daily. 02/26/24  Yes Vicci Barnie NOVAK, MD  insulin  glargine (LANTUS  SOLOSTAR) 100 UNIT/ML Solostar Pen Inject 14 Units into the skin daily. 02/26/24  Yes Vicci Barnie NOVAK, MD  ofloxacin  (FLOXIN ) 0.3 % OTIC solution Place 5 drops into the right ear 2 (two) times daily. 03/11/24  Yes Johnie, Carley A, NP  Vitamin D , Ergocalciferol , (DRISDOL ) 1.25 MG (50000 UNIT) CAPS capsule Take 1 capsule (50,000 Units total) by mouth every 7 (seven) days. 08/14/23  Yes  Mayers, Cari S, PA-C  Acetaminophen  (TYLENOL  PO) Take by mouth.    [provider]  glucose blood (TRUE METRIX BLOOD GLUCOSE TEST) test strip Use to check blood sugar up to 3 times daily. 08/24/23   Vicci Barnie NOVAK, MD  Insulin  Pen Needle (PEN NEEDLES) 31G X 8 MM MISC USE AS DIRECTED BY PHYSICIAN. 08/24/23   Vicci Barnie NOVAK, MD  Iron , Ferrous Sulfate , 325 (65 Fe) MG TABS Take 1 tablet by mouth every other day. 08/14/23   Mayers, Cari S, PA-C  medroxyPROGESTERone  (DEPO-PROVERA ) 150 MG/ML injection Inject 150 mg into the muscle every 3 (three) months.    [provider]  TRUEplus Lancets 28G MISC Use to check blood sugar up to 3 times daily. 02/26/24   Vicci Barnie NOVAK, MD  metFORMIN  (GLUCOPHAGE ) 1000 MG tablet Take 1 tablet (1,000 mg total) by mouth 2 (two) times daily with a meal. 07/16/20 09/14/20  Vicci Barnie NOVAK, MD    Family History Family History  Problem Relation Age of Onset   Diabetes Mother        oral agents   Hypertension Mother    Hypertension Father    Hypertension Brother    Breast cancer Cousin    Anesthesia problems Neg Hx    Hypotension Neg Hx    Malignant hyperthermia Neg Hx    Pseudochol deficiency Neg Hx    Colon cancer Neg Hx    Colon polyps Neg Hx    Crohn's disease Neg Hx    Esophageal cancer Neg Hx    Stomach cancer Neg Hx    Rectal cancer Neg Hx    Ulcerative colitis Neg Hx     Social History Social History   Tobacco Use   Smoking status: Never    Passive exposure: Past (co-wokers 10 years ago)   Smokeless tobacco: Never  Vaping Use   Vaping status: Never Used  Substance Use Topics   Alcohol use: No   Drug use: Never     Allergies   Keflex  [cephalexin ] and Metformin  and related   Review of Systems Review of Systems  HENT:  Positive for ear pain.   Gastrointestinal:  Positive for diarrhea.  Neurological:  Positive for headaches.     Physical Exam Triage Vital Signs ED Triage Vitals  Encounter Vitals Group      BP 03/20/24 1848 130/81     Girls Systolic BP Percentile --      Girls Diastolic BP Percentile --      Boys Systolic BP Percentile --      Boys Diastolic BP Percentile --      Pulse Rate 03/20/24 1848 97     Resp 03/20/24 1848 16     Temp 03/20/24 1848 99 F (37.2 C)     Temp Source 03/20/24 1848 Oral     SpO2  03/20/24 1848 97 %     Weight --      Height --      Head Circumference --      Peak Flow --      Pain Score 03/20/24 1844 6     Pain Loc --      Pain Education --      Exclude from Growth Chart --    No data found.  Updated Vital Signs BP 130/81 (BP Location: Right Arm)   Pulse 97   Temp 99 F (37.2 C) (Oral)   Resp 16   LMP 02/26/2024 (Approximate)   SpO2 97%   Visual Acuity Right Eye Distance:   Left Eye Distance:   Bilateral Distance:    Right Eye Near:   Left Eye Near:    Bilateral Near:     Physical Exam Vitals reviewed.  Constitutional:      General: She is not in acute distress.    Appearance: She is not ill-appearing, toxic-appearing or diaphoretic.  HENT:     Right Ear: Tympanic membrane and ear canal normal.     Left Ear: Tympanic membrane and ear canal normal.     Ears:     Comments: No pain now on traction of the pinna.  There is no swelling of the ear canal and the tympanic membrane's are bilaterally gray and shiny    Mouth/Throat:     Mouth: Mucous membranes are moist.  Eyes:     Extraocular Movements: Extraocular movements intact.     Conjunctiva/sclera: Conjunctivae normal.     Pupils: Pupils are equal, round, and reactive to light.  Cardiovascular:     Rate and Rhythm: Normal rate and regular rhythm.     Heart sounds: No murmur heard. Pulmonary:     Effort: Pulmonary effort is normal.     Breath sounds: Normal breath sounds.  Abdominal:     General: Bowel sounds are normal. There is no distension.     Palpations: Abdomen is soft.     Tenderness: There is no abdominal tenderness.  Musculoskeletal:     Cervical back: Neck  supple.  Lymphadenopathy:     Cervical: No cervical adenopathy.  Skin:    Coloration: Skin is not jaundiced or pale.  Neurological:     General: No focal deficit present.     Mental Status: She is alert and oriented to person, place, and time.  Psychiatric:        Behavior: Behavior normal.      UC Treatments / Results  Labs (all labs ordered are listed, but only abnormal results are displayed) Labs Reviewed  CBC  COMPREHENSIVE METABOLIC PANEL WITH GFR    EKG   Radiology No results found.  Procedures Procedures (including critical care time)  Medications Ordered in UC Medications - No data to display  Initial Impression / Assessment and Plan / UC Course  I have reviewed the triage vital signs and the nursing notes.  Pertinent labs & imaging results that were available during my care of the patient were reviewed by me and considered in my medical decision making (see chart for details).   Visit conducted in spanish  CBC and CMP are drawn today and staff will notify her if anything is significantly abnormal.  Supplies are provided for her to collect stool specimen and test for C. difficile and a GI pathogen panel are ordered. Final Clinical Impressions(s) / UC Diagnoses   Final diagnoses:  Diarrhea, unspecified type  Discharge Instructions      We have drawn blood to check your blood counts and electrolytes and kidney and liver function.  Staff will notify you if anything is significantly abnormal.  Please bring the stool specimen back to this clinic.  We are open 8-8 on Monday through Friday and 10-6 on Saturday and Sunday.  If you start feeling weaker or have more frequent loose stools, please consider going to emergency room for further evaluation      ED Prescriptions   None    PDMP not reviewed this encounter.   Vonna Sharlet POUR, MD 03/20/24 1911    Vonna Sharlet POUR, MD 03/20/24 [phone]

## 2024-03-21 ENCOUNTER — Ambulatory Visit (HOSPITAL_COMMUNITY): Payer: Self-pay

## 2024-03-21 ENCOUNTER — Telehealth (HOSPITAL_COMMUNITY): Payer: Self-pay

## 2024-03-21 ENCOUNTER — Other Ambulatory Visit (HOSPITAL_COMMUNITY)
Admission: RE | Admit: 2024-03-21 | Discharge: 2024-03-21 | Disposition: A | Discharge status: Home / Self Care | Payer: Self-pay | Attending: Family Medicine | Admitting: Family Medicine

## 2024-03-21 ENCOUNTER — Other Ambulatory Visit: Payer: Self-pay | Admitting: Family Medicine

## 2024-03-21 DIAGNOSIS — E1169 Type 2 diabetes mellitus with other specified complication: Secondary | ICD-10-CM | POA: Insufficient documentation

## 2024-03-21 DIAGNOSIS — D649 Anemia, unspecified: Secondary | ICD-10-CM | POA: Insufficient documentation

## 2024-03-21 DIAGNOSIS — Z1211 Encounter for screening for malignant neoplasm of colon: Secondary | ICD-10-CM | POA: Insufficient documentation

## 2024-03-21 DIAGNOSIS — E669 Obesity, unspecified: Secondary | ICD-10-CM | POA: Insufficient documentation

## 2024-03-21 LAB — CLOSTRIDIUM DIFFICILE BY PCR, REFLEXED
Hypervirulent Strain: NEGATIVE
Toxigenic C. Difficile by PCR: NEGATIVE

## 2024-03-21 LAB — C DIFFICILE QUICK SCREEN W PCR REFLEX
C Diff antigen: POSITIVE — AB
C Diff toxin: NEGATIVE

## 2024-03-21 MED ORDER — LOPERAMIDE HCL 2 MG PO TABS: 2.0000 mg | ORAL_TABLET | Freq: Four times a day (QID) | ORAL | 0 refills | Status: DC | PRN

## 2024-03-21 MED ORDER — LOPERAMIDE HCL 2 MG PO TABS: 2.0000 mg | ORAL_TABLET | Freq: Three times a day (TID) | ORAL | 0 refills | Status: DC | PRN

## 2024-03-21 NOTE — Telephone Encounter (Signed)
 Patient dropped off a stool sample, orders released and sent to the lab.

## 2024-03-21 NOTE — Telephone Encounter (Signed)
 Mychart message sent to patient.

## 2024-03-21 NOTE — Telephone Encounter (Signed)
 I have sent a prescription for Imodium  to the pharmacy for her.

## 2024-03-21 NOTE — Addendum Note (Signed)
 Addended by: Kiffany Schelling on: 03/21/2024 12:59 PM   Modules accepted: Orders

## 2024-03-22 LAB — GASTROINTESTINAL PANEL BY PCR, STOOL (REPLACES STOOL CULTURE)

## 2024-03-23 ENCOUNTER — Telehealth: Payer: Self-pay | Admitting: Internal Medicine

## 2024-03-23 MED ORDER — METRONIDAZOLE 500 MG PO TABS
500.0000 mg | ORAL_TABLET | Freq: Three times a day (TID) | ORAL | 0 refills | Status: DC
  Filled 2024-03-23: qty 30, 10d supply, fill #0

## 2024-03-23 NOTE — Telephone Encounter (Signed)
 Let pt know that her stool tested positive for bacteria called C. Diff.  I have snet a prescription to our pharmacy for antibiotic for her to pick up and take 3 times a day for 10 days.

## 2024-03-24 ENCOUNTER — Other Ambulatory Visit: Payer: Self-pay

## 2024-03-24 ENCOUNTER — Telehealth: Payer: Self-pay

## 2024-03-24 NOTE — Telephone Encounter (Signed)
 Called & informed patient that C. Diff is contagious. Advised to minimize the risk of spreading the infection, you should: Wash your hands frequently. Avoid touching your nose, mouth and eyes. Keep common areas around the home clean. Patient expressed verbal understanding.

## 2024-03-24 NOTE — Telephone Encounter (Signed)
 Called & spoke to the patient. Verified name & DOB. Informed of results. Informed that a prescription sent to the pharmacy. Informed to take  times a day for  days. Patient expressed verbal understanding of all discussed.

## 2024-03-24 NOTE — Telephone Encounter (Signed)
 Copied from CRM [id number]. Topic: Clinical - Lab/Test Results >> Mar 24, 2024 10:49 AM Montie POUR wrote: Reason for CRM:  Mayia wants if C-diff is contagious. Please call her back at [phone]. Interpreter ID [id number]

## 2024-03-25 ENCOUNTER — Other Ambulatory Visit: Payer: Self-pay

## 2024-04-09 ENCOUNTER — Other Ambulatory Visit: Payer: Self-pay

## 2024-04-11 ENCOUNTER — Other Ambulatory Visit: Payer: Self-pay

## 2024-05-01 ENCOUNTER — Ambulatory Visit
Admission: RE | Admit: 2024-05-01 | Discharge: 2024-05-01 | Disposition: A | Discharge status: Home / Self Care | Payer: Self-pay | Source: Ambulatory Visit | Attending: Obstetrics & Gynecology | Admitting: Obstetrics & Gynecology

## 2024-05-01 DIAGNOSIS — Z1231 Encounter for screening mammogram for malignant neoplasm of breast: Secondary | ICD-10-CM

## 2024-05-21 ENCOUNTER — Other Ambulatory Visit: Payer: Self-pay

## 2024-05-21 ENCOUNTER — Other Ambulatory Visit: Payer: Self-pay | Admitting: Physician Assistant

## 2024-05-21 DIAGNOSIS — E559 Vitamin D deficiency, unspecified: Secondary | ICD-10-CM

## 2024-05-22 ENCOUNTER — Other Ambulatory Visit: Payer: Self-pay

## 2024-06-13 ENCOUNTER — Other Ambulatory Visit: Payer: Self-pay

## 2024-06-17 ENCOUNTER — Other Ambulatory Visit: Payer: Self-pay

## 2024-06-18 ENCOUNTER — Other Ambulatory Visit: Payer: Self-pay

## 2024-06-30 ENCOUNTER — Encounter: Payer: Self-pay | Admitting: Internal Medicine

## 2024-06-30 ENCOUNTER — Ambulatory Visit: Payer: Self-pay | Attending: Internal Medicine | Admitting: Internal Medicine

## 2024-06-30 ENCOUNTER — Other Ambulatory Visit: Payer: Self-pay

## 2024-06-30 VITALS — BP 107/70 | HR 86 | Ht 65.0 in | Wt 196.0 lb

## 2024-06-30 DIAGNOSIS — E611 Iron deficiency: Secondary | ICD-10-CM

## 2024-06-30 DIAGNOSIS — Z2821 Immunization not carried out because of patient refusal: Secondary | ICD-10-CM

## 2024-06-30 DIAGNOSIS — E1169 Type 2 diabetes mellitus with other specified complication: Secondary | ICD-10-CM

## 2024-06-30 DIAGNOSIS — R200 Anesthesia of skin: Secondary | ICD-10-CM

## 2024-06-30 DIAGNOSIS — J302 Other seasonal allergic rhinitis: Secondary | ICD-10-CM

## 2024-06-30 DIAGNOSIS — E669 Obesity, unspecified: Secondary | ICD-10-CM

## 2024-06-30 DIAGNOSIS — E785 Hyperlipidemia, unspecified: Secondary | ICD-10-CM

## 2024-06-30 DIAGNOSIS — Z794 Long term (current) use of insulin: Secondary | ICD-10-CM

## 2024-06-30 DIAGNOSIS — E119 Type 2 diabetes mellitus without complications: Secondary | ICD-10-CM

## 2024-06-30 DIAGNOSIS — Z7984 Long term (current) use of oral hypoglycemic drugs: Secondary | ICD-10-CM

## 2024-06-30 DIAGNOSIS — Z6832 Body mass index (BMI) 32.0-32.9, adult: Secondary | ICD-10-CM

## 2024-06-30 LAB — POCT GLYCOSYLATED HEMOGLOBIN (HGB A1C): HbA1c, POC (controlled diabetic range): 10.9 % — AB (ref 0.0–7.0)

## 2024-06-30 MED ORDER — LORATADINE 10 MG PO TABS: 10.0000 mg | ORAL_TABLET | Freq: Every day | ORAL | 0 refills | Status: DC

## 2024-06-30 MED ORDER — LANTUS SOLOSTAR 100 UNIT/ML ~~LOC~~ SOPN: 18.0000 [IU] | PEN_INJECTOR | Freq: Every day | SUBCUTANEOUS | 99 refills | Status: DC

## 2024-06-30 MED ORDER — LORATADINE 10 MG PO TABS
10.0000 mg | ORAL_TABLET | Freq: Every day | ORAL | 0 refills | Status: AC
  Filled 2024-06-30: qty 90, 90d supply, fill #0

## 2024-06-30 MED ORDER — ATORVASTATIN CALCIUM 10 MG PO TABS
10.0000 mg | ORAL_TABLET | Freq: Every day | ORAL | 3 refills | Status: AC
Start: 2024-06-30 — End: ?
  Filled 2024-06-30: qty 90, 90d supply, fill #0

## 2024-06-30 MED ORDER — GABAPENTIN 100 MG PO CAPS: 200.0000 mg | ORAL_CAPSULE | Freq: Every day | ORAL | 3 refills | Status: DC

## 2024-06-30 MED ORDER — BASAGLAR KWIKPEN 100 UNIT/ML ~~LOC~~ SOPN
18.0000 [IU] | PEN_INJECTOR | Freq: Every day | SUBCUTANEOUS | 99 refills | Status: DC
  Filled 2024-06-30: qty 15, 83d supply, fill #0
  Filled 2024-07-25: qty 6, 33d supply, fill #0

## 2024-06-30 MED ORDER — GABAPENTIN 100 MG PO CAPS
200.0000 mg | ORAL_CAPSULE | Freq: Every day | ORAL | 3 refills | Status: AC
Start: 2024-06-30 — End: ?
  Filled 2024-06-30: qty 60, 30d supply, fill #0

## 2024-06-30 MED ORDER — GLIMEPIRIDE 2 MG PO TABS: ORAL_TABLET | ORAL | 4 refills | Status: DC

## 2024-06-30 MED ORDER — ATORVASTATIN CALCIUM 10 MG PO TABS: 10.0000 mg | ORAL_TABLET | Freq: Every day | ORAL | 3 refills | Status: DC

## 2024-06-30 NOTE — Patient Instructions (Signed)
  VISIT SUMMARY: During your visit, we discussed the management of your diabetes, hyperlipidemia, iron  deficiency, and allergy symptoms. We also reviewed your recent lab results and made adjustments to your medications to better control your conditions.  YOUR PLAN: -TYPE 2 DIABETES MELLITUS WITH DIABETIC POLYNEUROPATHY: Your A1c level has increased to 10.9, indicating that your blood sugar levels are not well controlled. The numbness in your fingers suggests diabetic neuropathy, which is nerve damage caused by high blood sugar levels. We have increased your Lantus  insulin  to 18 units daily at bedtime and your glimepiride  to 2 tablets in the morning and 1 tablet in the evening. Please check your blood sugars daily before breakfast and before dinner, and keep a log of your readings. We have also prescribed gabapentin 100 mg, 2 capsules at bedtime, to help with the neuropathy. Additionally, we have ordered a vitamin B12 level test to rule out deficiency as a cause of the neuropathy. Please follow up with the clinical pharmacist in one month with your blood sugar log.  -IRON  DEFICIENCY: Iron  deficiency means you have lower than normal levels of iron  in your blood, which can cause fatigue and weakness. We have ordered an iron  level test to assess the need for continued iron  supplementation and have refilled your iron  tablets.  -PURE HYPERCHOLESTEROLEMIA: Hypercholesterolemia means you have high levels of cholesterol in your blood, which can increase your risk of heart disease. Since you have not taken atorvastatin  for two months, we have resent the prescription to your pharmacy.  -OTHER SEASONAL ALLERGIC RHINITIS: Seasonal allergic rhinitis is an allergic reaction that causes itchy eyes, throat, and sneezing. Since your previous allergy spray was ineffective, we have prescribed loratadine  (Claritin ) for your allergy symptoms.  -GENERAL HEALTH MAINTENANCE: You are overdue for a diabetic eye exam and the  hepatitis B vaccine series. We recommend getting the diabetic eye exam before the end of the year and advised you to get the hepatitis B vaccine series through the health department.  INSTRUCTIONS: Please follow up with the clinical pharmacist in one month with your blood sugar log. Also, schedule a diabetic eye exam before the end of the year and consider getting the hepatitis B vaccine series through the health department.                      Contains text generated by [employer].                                 Contains text generated by [employer].

## 2024-06-30 NOTE — Progress Notes (Signed)
 Patient ID: Morgan Ramsey, female    DOB: 1976-05-03  MRN: [id number]  CC: Diabetes (DM f/u./Discuss cholesterol - is med still needed?/Tips of fingers go numb, hot water makes fingertips more sensitive/Increased allergy, itchy eyes X2 mo/No to flu vax)   Subjective: Morgan Ramsey is a 48 y.o. female who presents for chronic ds management. 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 #[id number], Steffan  Discussed the use of AI scribe software for clinical note transcription with the patient, who gave verbal consent to proceed.  History of Present Illness   Morgan Ramsey is a 48 year old female with diabetes, hyperlipidemia, and iron  deficiency who presents for follow-up of her chronic conditions.  DM: Results for orders placed or performed in visit on 06/30/24  POCT glycosylated hemoglobin (Hb A1C)   Collection Time: 06/30/24  9:48 AM  Result Value Ref Range   Hemoglobin A1C     HbA1c POC (<> result, manual entry)     HbA1c, POC (prediabetic range)     HbA1c, POC (controlled diabetic range) 10.9 (A) 0.0 - 7.0 %   Her diabetes management was discussed, noting an increase in her A1c to 10.9 from 10.7. She is currently on 14 units of insulin  daily, which was increased from 10 units previously. She also takes glimepiride  4 mg daily and was started Trulicity  on last visit but discontinued it after 2-3 doses due to fear of potential side effects even though she did not experience any S.E. Other people told her the medication is not good for the body.  She checks her blood sugar inconsistently, with readings ranging from 145 to 200 mg/dL. She tries to eat healthily and avoids sugary drinks and snacks, consuming them only occasionally. She exercises by walking about 20 minutes, three times a week.  HL: she has not taken  atorvastatin  for about two months as her supply ran out.   She reports numbness in her fingers for about a month, affecting all fingers on one LT hand and three on the other. This numbness is constant.  She requested a refill for iron  tablets. Looks like last filled 08/2023. Last CBC in August showed H/H 12.7/37.9 with MCV of 75  She has experienced itchy eyes for the past two months, with redness occurring when she scratches them. She endorses itchy throat when sneezing. She uses an allergy spray but finds it ineffective.       Patient Active Problem List   Diagnosis Date Noted   Vitamin D  deficiency 08/24/2023   Iron  deficiency 08/24/2023   Menorrhagia with regular cycle 08/24/2023   Sepsis secondary to UTI (HCC) 07/24/2022   Type 2 diabetes mellitus with obesity 07/24/2022   Class 1 obesity due to excess calories with body mass index (BMI) of 34.0 to 34.9 in adult 07/24/2022   Pap smear of cervix shows high risk HPV present 11/15/2020   Influenza vaccine refused 07/16/2020   Hyperlipidemia associated with type 2 diabetes mellitus (HCC) 06/09/2020   Statins contraindicated 06/09/2020   Microalbuminuria due to type 2 diabetes mellitus (HCC) 06/09/2020   Type 2 diabetes mellitus with obesity 06/04/2020   Polyhydramnios, antepartum complication 08/17/2019   Polyhydramnios in third trimester 08/13/2019   Unstable lie of fetus 08/06/2019   Obesity (BMI 30.0-34.9) 05/07/2019   Transient hypertension of pregnancy 05/07/2019   Language barrier 04/26/2015   H/O herpes simplex  type 2 infection 04/26/2015   Previous cesarean section 04/26/2015   Obesity in pregnancy 04/26/2015   Anemia 04/26/2015     Current Outpatient Medications on File Prior to Visit  Medication Sig Dispense Refill   glucose blood (TRUE METRIX BLOOD GLUCOSE TEST) test strip Use to check blood sugar up to 3 times daily. 100 each 2   Insulin  Pen Needle (PEN NEEDLES) 31G X 8 MM MISC USE AS DIRECTED BY PHYSICIAN. 100 each  6   Iron , Ferrous Sulfate , 325 (65 Fe) MG TABS Take 1 tablet by mouth every other day. 100 tablet 1   medroxyPROGESTERone  (DEPO-PROVERA ) 150 MG/ML injection Inject 150 mg into the muscle every 3 (three) months.     TRUEplus Lancets 28G MISC Use to check blood sugar up to 3 times daily. 100 each 2   Vitamin D , Ergocalciferol , (DRISDOL ) 1.25 MG (50000 UNIT) CAPS capsule Take 1 capsule (50,000 Units total) by mouth every 7 (seven) days. 4 capsule 2   [DISCONTINUED] metFORMIN  (GLUCOPHAGE ) 1000 MG tablet Take 1 tablet (1,000 mg total) by mouth 2 (two) times daily with a meal. 60 tablet 6   No current facility-administered medications on file prior to visit.    Allergies  Allergen Reactions   Keflex  [Cephalexin ]     Chest tightness and SOB when prescribed 01/04/22 for strep throat   Metformin  And Related     Diarrhea    Social History   Socioeconomic History   Marital status: Married    Spouse name: Jioberto Dimas   Number of children: 4   Years of education: Not on file   Highest education level: 6th grade  Occupational History    Comment: unemployed  Tobacco Use   Smoking status: Never    Passive exposure: Past (co-wokers 10 years ago)   Smokeless tobacco: Never  Vaping Use   Vaping status: Never Used  Substance and Sexual Activity   Alcohol use: No   Drug use: Never   Sexual activity: Yes    Birth control/protection: Condom, Injection  Other Topics Concern   Not on file  Social History Narrative   ** Merged History Encounter **       Social Drivers of Health   Financial Resource Strain: Low Risk  (06/27/2024)   Overall Financial Resource Strain (CARDIA)    Difficulty of Paying Living Expenses: Not very hard  Food Insecurity: Food Insecurity Present (06/27/2024)   Hunger Vital Sign    Worried About Running Out of Food in the Last Year: Sometimes true    Ran Out of Food in the Last Year: Never true  Transportation Needs: No Transportation Needs (06/27/2024)   PRAPARE  - Administrator, Civil Service (Medical): No    Lack of Transportation (Non-Medical): No  Physical Activity: Insufficiently Active (06/27/2024)   Exercise Vital Sign    Days of Exercise per Week: 3 days    Minutes of Exercise per Session: 20 min  Stress: No Stress Concern Present (06/27/2024)   [employer] of Occupational Health - Occupational Stress Questionnaire    Feeling of Stress: Not at all  Social Connections: Socially Integrated (06/27/2024)   Social Connection and Isolation Panel    Frequency of Communication with Friends and Family: Twice a week    Frequency of Social Gatherings with Friends and Family: Twice a week    Attends Religious Services: More than 4 times per year    Active Member of [employer] or Organizations: Yes    Attends [employer]  or Organization Meetings: 1 to 4 times per year    Marital Status: Married  Catering Manager Violence: Not At Risk (02/26/2024)   Humiliation, Afraid, Rape, and Kick questionnaire    Fear of Current or Ex-Partner: No    Emotionally Abused: No    Physically Abused: No    Sexually Abused: No    Family History  Problem Relation Age of Onset   Diabetes Mother        oral agents   Hypertension Mother    Hypertension Father    Hypertension Brother    Breast cancer Cousin    Anesthesia problems Neg Hx    Hypotension Neg Hx    Malignant hyperthermia Neg Hx    Pseudochol deficiency Neg Hx    Colon cancer Neg Hx    Colon polyps Neg Hx    Crohn's disease Neg Hx    Esophageal cancer Neg Hx    Stomach cancer Neg Hx    Rectal cancer Neg Hx    Ulcerative colitis Neg Hx     Past Surgical History:  Procedure Laterality Date   CESAREAN SECTION  05/21/2011   Procedure: CESAREAN SECTION;  Surgeon: Norleen LULLA Server, MD;  Location: [facility];  Service: Gynecology;  Laterality: N/A;  Primary cesarean section with delivery of baby girl at 37. Apgars 9/9.   HERPES SIMPLEX VIRUS DFA     last outbreak prior to 2017    ROS: Review of  Systems Negative except as stated above  PHYSICAL EXAM: BP 107/70 (BP Location: Left Arm, Patient Position: Sitting, Cuff Size: Normal)   Pulse 86   Ht 5' 5 (1.651 m)   Wt 196 lb (88.9 kg)   SpO2 98%   BMI 32.62 kg/m   Wt Readings from Last 3 Encounters:  06/30/24 196 lb (88.9 kg)  02/26/24 204 lb (92.5 kg)  08/24/23 203 lb (92.1 kg)    Physical Exam  General appearance - alert, well appearing, middle age Hispanic female and in no distress Mental status - normal mood, behavior, speech, dress, motor activity, and thought processes Eyes - pupils equal and reactive, extraocular eye movements intact Nose - normal and patent, no erythema, discharge or polyps Mouth - mucous membranes moist, pharynx normal without lesions Neck - supple, no significant adenopathy Chest - clear to auscultation, no wheezes, rales or rhonchi, symmetric air entry Heart - normal rate, regular rhythm, normal S1, S2, no murmurs, rubs, clicks or gallops Extremities - peripheral pulses normal, no pedal edema, no clubbing or cyanosis Diabetic Foot Exam - Simple   Simple Foot Form Diabetic Foot exam was performed with the following findings: Yes 06/30/2024 10:35 AM  Visual Inspection No deformities, no ulcerations, no other skin breakdown bilaterally: Yes Sensation Testing Intact to touch and monofilament testing bilaterally: Yes Pulse Check Posterior Tibialis and Dorsalis pulse intact bilaterally: Yes Comments         Latest Ref Rng & Units 03/20/2024    7:09 PM 08/12/2023    4:16 PM 04/24/2023   11:12 AM  CMP  Glucose 70 - 99 mg/dL 690  668  825   BUN 6 - 20 mg/dL 12  11  10    Creatinine 0.44 - 1.00 mg/dL 9.13  9.32  9.31   Sodium 135 - 145 mmol/L 133  133  137   Potassium 3.5 - 5.1 mmol/L 3.6  3.7  4.1   Chloride 98 - 111 mmol/L 103  102  102   CO2 22 - 32 mmol/L  21  24  23    Calcium  8.9 - 10.3 mg/dL 8.7  8.6  8.9   Total Protein 6.5 - 8.1 g/dL 7.0  7.2  7.1   Total Bilirubin 0.0 - 1.2 mg/dL  0.4  0.4  0.3   Alkaline Phos 38 - 126 U/L 113  98  121   AST 15 - 41 U/L 21  18  11    ALT 0 - 44 U/L 25  18  11     Lipid Panel     Component Value Date/Time   CHOL 144 04/24/2023 1112   TRIG 102 04/24/2023 1112   HDL 46 04/24/2023 1112   CHOLHDL 3.1 04/24/2023 1112   LDLCALC 79 04/24/2023 1112    CBC    Component Value Date/Time   WBC 7.7 03/20/2024 1909   RBC 5.04 03/20/2024 1909   HGB 12.7 03/20/2024 1909   HGB 11.7 08/21/2022 1054   HGB 9.3 04/02/2015 0000   HCT 37.9 03/20/2024 1909   HCT 37.3 08/21/2022 1054   HCT 32 04/02/2015 0000   PLT 321 03/20/2024 1909   PLT 263 08/21/2022 1054   PLT 334 04/02/2015 0000   MCV 75.2 (L) 03/20/2024 1909   MCV 74 (L) 08/21/2022 1054   MCH 25.2 (L) 03/20/2024 1909   MCHC 33.5 03/20/2024 1909   RDW 13.5 03/20/2024 1909   RDW 14.8 08/21/2022 1054   LYMPHSABS 1.6 08/12/2023 1616   LYMPHSABS 1.7 05/17/2022 0912   MONOABS 0.6 08/12/2023 1616   EOSABS 0.2 08/12/2023 1616   EOSABS 0.2 05/17/2022 0912   BASOSABS 0.0 08/12/2023 1616   BASOSABS 0.0 05/17/2022 0912    ASSESSMENT AND PLAN: 1. Type 2 diabetes mellitus in patient with obesity (HCC) (Primary) Not at goal and has not improved from last visit.  Patient noncompliant with taking Trulicity .  I discussed the Trulicity  with her informing of the benefits of the medication but patient still does not want to be placed back on it. Recommend increase Lantus  insulin  to 18 units daily and Amaryl  to 4 mg in the morning/2 mg in the evening. Encouraged to check blood sugars once to twice a day before meals and record her readings.  Patient to follow-up with clinical pharmacist in 1 month. Encourage healthy eating habits.  Continue regular exercise. - POCT glycosylated hemoglobin (Hb A1C) - glimepiride  (AMARYL ) 2 MG tablet; 2 tabs PO Q a.m and 1 tab PO Q evening  Dispense: 270 tablet; Refill: 4 - insulin  glargine (LANTUS  SOLOSTAR) 100 UNIT/ML Solostar Pen; Inject 18 Units into the skin at  bedtime.  Dispense: 15 mL; Refill: PRN  2. Diabetes mellitus treated with oral medication (HCC) 3. Insulin  long-term use (HCC) See #1 above  4. Iron  deficiency Will check blood count and iron  studies today to see whether she still needs to be on iron  supplement. - Iron , TIBC and Ferritin Panel - CBC  5. Hyperlipidemia associated with type 2 diabetes mellitus (HCC) Refill sent on atorvastatin . - Lipid panel - atorvastatin  (LIPITOR) 10 MG tablet; Take 1 tablet (10 mg total) by mouth daily.  Dispense: 90 tablet; Refill: 3  6. Numbness of fingers Likely due to diabetic neuropathy.  Informed her that this underscores the importance of getting her diabetes under better control.  We discussed trying her with low-dose gabapentin at bedtime and she is agreeable to this. - Vitamin B12 - gabapentin (NEURONTIN) 100 MG capsule; Take 2 capsules (200 mg total) by mouth at bedtime.  Dispense: 60 capsule; Refill: 3  7.  Influenza vaccination declined Recommended. Pt declined  8. Seasonal allergies Start Claritin  - loratadine  (CLARITIN ) 10 MG tablet; Take 1 tablet (10 mg total) by mouth daily.  Dispense: 90 tablet; Refill: 0   Patient was given the opportunity to ask questions.  Patient verbalized understanding of the plan and was able to repeat key elements of the plan.   This documentation was completed using Paediatric nurse.  Any transcriptional errors are unintentional.  Orders Placed This Encounter  Procedures   Iron , TIBC and Ferritin Panel   CBC   Lipid panel   Vitamin B12   POCT glycosylated hemoglobin (Hb A1C)     Requested Prescriptions   Signed Prescriptions Disp Refills   glimepiride  (AMARYL ) 2 MG tablet 270 tablet 4    Sig: 2 tabs PO Q a.m and 1 tab PO Q evening   atorvastatin  (LIPITOR) 10 MG tablet 90 tablet 3    Sig: Take 1 tablet (10 mg total) by mouth daily.   loratadine  (CLARITIN ) 10 MG tablet 90 tablet 0    Sig: Take 1 tablet (10 mg total) by  mouth daily.   gabapentin (NEURONTIN) 100 MG capsule 60 capsule 3    Sig: Take 2 capsules (200 mg total) by mouth at bedtime.   insulin  glargine (LANTUS  SOLOSTAR) 100 UNIT/ML Solostar Pen 15 mL PRN    Sig: Inject 18 Units into the skin at bedtime.    Return in about 5 months (around 11/28/2024) for 4 weeks with clinical pharmacist for DM.  Barnie Louder, MD, FACP

## 2024-07-01 ENCOUNTER — Other Ambulatory Visit: Payer: Self-pay | Admitting: Internal Medicine

## 2024-07-01 ENCOUNTER — Other Ambulatory Visit: Payer: Self-pay

## 2024-07-01 ENCOUNTER — Ambulatory Visit: Payer: Self-pay | Admitting: Internal Medicine

## 2024-07-01 DIAGNOSIS — E611 Iron deficiency: Secondary | ICD-10-CM

## 2024-07-01 LAB — IRON,TIBC AND FERRITIN PANEL
Ferritin: 67 ng/mL (ref 15–150)
Iron Saturation: 19 % (ref 15–55)
Iron: 66 ug/dL (ref 27–159)
Total Iron Binding Capacity: 354 ug/dL (ref 250–450)
UIBC: 288 ug/dL (ref 131–425)

## 2024-07-01 LAB — LIPID PANEL
Chol/HDL Ratio: 4.5 ratio — ABNORMAL HIGH (ref 0.0–4.4)
Cholesterol, Total: 204 mg/dL — ABNORMAL HIGH (ref 100–199)
HDL: 45 mg/dL (ref >39)
LDL Chol Calc (NIH): 139 mg/dL — ABNORMAL HIGH (ref 0–99)
Triglycerides: 109 mg/dL (ref 0–149)
VLDL Cholesterol Cal: 20 mg/dL (ref 5–40)

## 2024-07-01 LAB — CBC
Hematocrit: 43.3 % (ref 34.0–46.6)
Hemoglobin: 13.9 g/dL (ref 11.1–15.9)
MCH: 25.8 pg — ABNORMAL LOW (ref 26.6–33.0)
MCHC: 32.1 g/dL (ref 31.5–35.7)
MCV: 80 fL (ref 79–97)
Platelets: 320 x10E3/uL (ref 150–450)
RBC: 5.39 x10E6/uL — ABNORMAL HIGH (ref 3.77–5.28)
RDW: 13.3 % (ref 11.7–15.4)
WBC: 6.2 x10E3/uL (ref 3.4–10.8)

## 2024-07-01 LAB — VITAMIN B12: Vitamin B-12: 827 pg/mL (ref 232–1245)

## 2024-07-01 MED ORDER — IRON (FERROUS SULFATE) 325 (65 FE) MG PO TABS
1.0000 | ORAL_TABLET | ORAL | 0 refills | Status: AC
  Filled 2024-07-01: qty 100, 200d supply, fill #0

## 2024-07-03 ENCOUNTER — Other Ambulatory Visit: Payer: Self-pay

## 2024-07-15 ENCOUNTER — Other Ambulatory Visit: Payer: Self-pay

## 2024-07-23 ENCOUNTER — Ambulatory Visit: Payer: Self-pay

## 2024-07-23 NOTE — Telephone Encounter (Signed)
 FYI

## 2024-07-23 NOTE — Telephone Encounter (Signed)
 FYI Only or Action Required?: FYI only for provider: ED advised.   Patient was last seen in primary care on 06/30/2024 by Vicci Barnie NOVAK, MD.  Called Nurse Triage reporting Dizziness and Leg Pain.  Symptoms began a week ago.  Interventions attempted: Rest, hydration, or home remedies.  Symptoms are: gradually worsening.  Triage Disposition: Go to ED Now (or PCP Triage)  Patient/caregiver understands and will follow disposition?: Yes  Reason for Disposition  [1] SEVERE pain (e.g., excruciating, unable to do any normal activities) AND [2] not improved after 2 hours of pain medicine  SEVERE dizziness (e.g., unable to stand, requires support to walk, feels like passing out now)  Answer Assessment - Initial Assessment Questions Triage completed with interpreter services [place] ID# [phone]. Patient states that she started feeling this pain suddenly on Friday and she was taking pain medicine with no relief. Yesterday she did some exercises with a therapist and this seemed to help the pain some. She states that before the therapy she couldn't walk on the leg, but today she can. The pain does interfere with her normal activities even after the therapy. She reports it as a throbbing pain with swelling.   1. ONSET: When did the pain start?      Friday night  2. LOCATION: Where is the pain located?      Side of left leg from hip to knee  3. PAIN: How bad is the pain?    (Scale 1-10; or mild, moderate, severe)     6-8/10  4. WORK OR EXERCISE: Has there been any recent work or exercise that involved this part of the body?      No  5. CAUSE: What do you think is causing the leg pain?     I don't know it just started suddenly.  6. OTHER SYMPTOMS: Do you have any other symptoms? (e.g., chest pain, back pain, breathing difficulty, swelling, rash, fever, numbness, weakness)     Swelling  7. PREGNANCY: Is there any chance you are pregnant? When was your last menstrual  period?     No  Answer Assessment - Initial Assessment Questions Triage completed with interpreter services [facility] ID# [phone]. Patient states that  she had an episode of dizziness one time last week but it resolved on its own. She states that she started to feel dizzy again yesterday, but it has not gone away. She has not been able to get up and do things because she feels like she will pass out and she cannot walk. She states that her husband is at work and when she was home alone this morning she stayed in bed so she wouldn't fall. She mentions that her blood sugar has been running high for a while and she discussed this with her PCP. She denies chest pain and SOB, but does state that she's been nauseous. Pt advised to go to ED. States that she will have her husband take her when he gets home from work. Advised to call back if symptoms worsen.   1. DESCRIPTION: Describe your dizziness.     I feel like the room is spinning so I close my eyes to make it stop  2. LIGHTHEADED: Do you feel lightheaded? (e.g., somewhat faint, woozy, weak upon standing)     Yes  3. VERTIGO: Do you feel like either you or the room is spinning or tilting? (i.e., vertigo)     Yes  4. SEVERITY: How bad is it?  Do you feel like  you are going to faint? Can you stand and walk?     Moderate to severe; Patient states that when she is resting it isn't bad, but when she is up she feels like she can't walk and she's going to faint  5. ONSET:  When did the dizziness begin?     She felt it one time about a week ago, but then it started again yesterday and has not gone away  6. AGGRAVATING FACTORS: Does anything make it worse? (e.g., standing, change in head position)     Sitting, standing  7. HEART RATE: Can you tell me your heart rate? How many beats in 15 seconds?  (Note: Not all patients can do this.)       No  8. CAUSE: What do you think is causing the dizziness? (e.g., decreased fluids or food,  diarrhea, emotional distress, heat exposure, new medicine, sudden standing, vomiting; unknown)     I'm  not sure, my doctor told me my cholesterol is high but I don't know if that's why.  9. RECURRENT SYMPTOM: Have you had dizziness before? If Yes, ask: When was the last time? What happened that time?     Yes, one week ago and it went away on its own  10. OTHER SYMPTOMS: Do you have any other symptoms? (e.g., fever, chest pain, vomiting, diarrhea, bleeding)       Nausea  11. PREGNANCY: Is there any chance you are pregnant? When was your last menstrual period?       No  Protocols used: Leg Pain-A-AH, Dizziness - Lightheadedness-A-AH  Copied from CRM #[id number]. Topic: Clinical - Red Word Triage >> Jul 23, 2024  2:31 PM Montie POUR wrote: Red Word that prompted transfer to Nurse Triage:  She feels dizzy when gets up and this has been going on for a few days. She feels like she is going to fall but has not fell yet. She checks her blood sugar and it is always 200 to 280. Her left leg is hurting and swollen. Pain level is 8

## 2024-07-24 ENCOUNTER — Encounter (HOSPITAL_COMMUNITY): Payer: Self-pay

## 2024-07-24 ENCOUNTER — Emergency Department (HOSPITAL_COMMUNITY): Payer: Self-pay

## 2024-07-24 ENCOUNTER — Emergency Department (HOSPITAL_COMMUNITY)
Admission: EM | Admit: 2024-07-24 | Discharge: 2024-07-24 | Disposition: A | Discharge status: Home / Self Care | Payer: Self-pay

## 2024-07-24 DIAGNOSIS — Z794 Long term (current) use of insulin: Secondary | ICD-10-CM | POA: Insufficient documentation

## 2024-07-24 DIAGNOSIS — I10 Essential (primary) hypertension: Secondary | ICD-10-CM | POA: Insufficient documentation

## 2024-07-24 DIAGNOSIS — Z7984 Long term (current) use of oral hypoglycemic drugs: Secondary | ICD-10-CM | POA: Insufficient documentation

## 2024-07-24 DIAGNOSIS — M13852 Other specified arthritis, left hip: Secondary | ICD-10-CM | POA: Insufficient documentation

## 2024-07-24 DIAGNOSIS — E119 Type 2 diabetes mellitus without complications: Secondary | ICD-10-CM | POA: Insufficient documentation

## 2024-07-24 DIAGNOSIS — R42 Dizziness and giddiness: Secondary | ICD-10-CM | POA: Insufficient documentation

## 2024-07-24 MED ORDER — MECLIZINE HCL 25 MG PO TABS: 25.0000 mg | ORAL_TABLET | Freq: Three times a day (TID) | ORAL | 0 refills | Status: AC | PRN

## 2024-07-24 MED ORDER — KETOROLAC TROMETHAMINE 30 MG/ML IJ SOLN
30.0000 mg | Freq: Once | INTRAMUSCULAR | Status: AC
  Administered 2024-07-24: 30 mg via INTRAMUSCULAR
  Filled 2024-07-24: qty 1

## 2024-07-24 MED ORDER — MELOXICAM 7.5 MG PO TABS: 7.5000 mg | ORAL_TABLET | Freq: Every day | ORAL | 0 refills | Status: AC

## 2024-07-24 MED ORDER — OXYCODONE-ACETAMINOPHEN 5-325 MG PO TABS
1.0000 | ORAL_TABLET | Freq: Once | ORAL | Status: AC
  Administered 2024-07-24: 1 via ORAL
  Filled 2024-07-24: qty 1

## 2024-07-24 MED ORDER — ONDANSETRON 4 MG PO TBDP
4.0000 mg | ORAL_TABLET | Freq: Once | ORAL | Status: AC
  Administered 2024-07-24: 4 mg via ORAL
  Filled 2024-07-24: qty 1

## 2024-07-24 MED ORDER — MECLIZINE HCL 25 MG PO TABS
25.0000 mg | ORAL_TABLET | Freq: Once | ORAL | Status: AC
  Administered 2024-07-24: 25 mg via ORAL
  Filled 2024-07-24: qty 1

## 2024-07-24 NOTE — ED Triage Notes (Addendum)
 Pt c/o L leg pain from hip to knee x6 days.  Pt denies injury.  Pain score 8/10 increasing w/ movement.  Pt reports taking OTC pain medication w/o relief.   Also, Pt c/o dizziness x2 days.  Pt reports dizziness increases w/ movement.

## 2024-07-24 NOTE — ED Provider Triage Note (Signed)
 Emergency Medicine Provider Triage Evaluation Note  Klee Kolek , a 48 y.o. female  was evaluated in triage.  Pt complains of low back pain predominantly in the left hip that radiates to the left anterior thigh.  It has been worsened in the past 2 days.  She says she has had on and off issues between 1 leg or the other for a long time but this is much worse, she is having difficulty bearing weight.  Denies any falls or trauma.  Denies any leg swelling.  She only took Tylenol  yesterday.  Separately she reports she has had 2 days of vertigo, feeling the room spinning that is worse with head movement.  She has not had this prior before.  Review of Systems  Positive: Vertigo, hip and leg pain Negative: Fevers, chills  Physical Exam  BP 113/89 (BP Location: Right Arm)   Pulse 82   Temp 98.7 F (37.1 C) (Oral)   Resp 18   Ht 5' 5 (1.651 m)   Wt 88.9 kg   SpO2 100%   BMI 32.62 kg/m  Gen:   Awake, no distress   Resp:  Normal effort  MSK:   Patient has tenderness and difficulty with range of motion testing at the left hip predominantly, positive straight leg test, no weakness on isolated strength testing of the lower extremity No abnormal findings on neurological exam, questionable unilateral nystagmus  Medical Decision Making  Medically screening exam initiated at 11:53 AM.  Appropriate orders placed.  Karle Camacho-Gonzalez was informed that the remainder of the evaluation will be completed by another provider, this initial triage assessment does not replace that evaluation, and the importance of remaining in the ED until their evaluation is complete.  Suspect this is sciatica type pain but think is reasonable to evaluate her hip as well with an x-ray.  Low suspicion for cauda equina syndrome.  Doubt DVT or infection of the lower extremity.  X-rays ordered.  Pain medicine also ordered, as well as nausea medication and meclizine.  Vertigo likely peripheral; doubt CVA.   Cottie Donnice PARAS, MD 07/24/24 1155

## 2024-07-24 NOTE — Discharge Instructions (Addendum)
 Your workup today was reassuring.  X-ray does show some arthritis.  Take the meloxicam for pain regarding this.  Take the meclizine as needed for dizziness.  Follow-up with your doctor.  Return to the ER for worsening symptoms.

## 2024-07-24 NOTE — ED Provider Notes (Addendum)
 [facility] EMERGENCY DEPARTMENT AT [facility] Provider Note   CSN: [id number] Arrival date & time: 07/24/24  1001     Patient presents with: Leg Pain and Dizziness   Morgan Ramsey is a 48 y.o. female.   48 year old female with past medical history of diabetes and hypertension presenting to the emergency department today with left hip pain.  The patient states she is also been having some dizziness intermittently now over the past few weeks.  Denies any focal weakness, numbness, or tingling.  She denies any fevers.  The patient denies any injuries to her hip.  States that the pain is worse with walking.  She denies any associated back pain with this.  The patient was seen at triage earlier today and received medications and she states that her dizziness has resolved and that her hip pain is improved.  She denies any history of IV drug abuse.   Leg Pain Dizziness      Prior to Admission medications  Medication Sig Start Date End Date Taking? Authorizing Provider  meclizine (ANTIVERT) 25 MG tablet Take 1 tablet (25 mg total) by mouth 3 (three) times daily as needed for dizziness. 07/24/24  Yes Ula Prentice SAUNDERS, MD  meloxicam (MOBIC) 7.5 MG tablet Take 1 tablet (7.5 mg total) by mouth daily. 07/24/24  Yes Ula Prentice SAUNDERS, MD  atorvastatin  (LIPITOR) 10 MG tablet Take 1 tablet (10 mg total) by mouth daily. 06/30/24   Vicci Barnie NOVAK, MD  gabapentin  (NEURONTIN ) 100 MG capsule Take 2 capsules (200 mg total) by mouth at bedtime. 06/30/24   Vicci Barnie NOVAK, MD  glimepiride  (AMARYL ) 2 MG tablet 2 tabs PO Q a.m and 1 tab PO Q evening 06/30/24   Vicci Barnie NOVAK, MD  glucose blood (TRUE METRIX BLOOD GLUCOSE TEST) test strip Use to check blood sugar up to 3 times daily. 08/24/23   Vicci Barnie NOVAK, MD  insulin  glargine (LANTUS  SOLOSTAR) 100 UNIT/ML Solostar Pen Inject 18 Units into the skin at bedtime. 06/30/24   Vicci Barnie NOVAK, MD  Insulin  Pen Needle (PEN NEEDLES) 31G X  8 MM MISC USE AS DIRECTED BY PHYSICIAN. 08/24/23   Vicci Barnie NOVAK, MD  Iron , Ferrous Sulfate , 325 (65 Fe) MG TABS Take 1 tablet by mouth every other day. 07/01/24   Vicci Barnie NOVAK, MD  loratadine  (CLARITIN ) 10 MG tablet Take 1 tablet (10 mg total) by mouth daily. 06/30/24   Vicci Barnie NOVAK, MD  medroxyPROGESTERone  (DEPO-PROVERA ) 150 MG/ML injection Inject 150 mg into the muscle every 3 (three) months.    [provider]  TRUEplus Lancets 28G MISC Use to check blood sugar up to 3 times daily. 02/26/24   Vicci Barnie NOVAK, MD  Vitamin D , Ergocalciferol , (DRISDOL ) 1.25 MG (50000 UNIT) CAPS capsule Take 1 capsule (50,000 Units total) by mouth every 7 (seven) days. 08/14/23   Mayers, Cari S, PA-C  metFORMIN  (GLUCOPHAGE ) 1000 MG tablet Take 1 tablet (1,000 mg total) by mouth 2 (two) times daily with a meal. 07/16/20 09/14/20  Vicci Barnie NOVAK, MD    Allergies: Keflex  [cephalexin ] and Metformin  and related    Review of Systems  Neurological:  Positive for dizziness.  All other systems reviewed and are negative.   Updated Vital Signs BP 111/77 (BP Location: Right Arm)   Pulse 83   Temp 98 F (36.7 C)   Resp 15   Ht 5' 5 (1.651 m)   Wt 88.9 kg   SpO2 100%   BMI 32.62  kg/m   Physical Exam Vitals and nursing note reviewed.   Gen: NAD Eyes: PERRL, EOMI HEENT: no oropharyngeal swelling Neck: trachea midline Resp: clear to auscultation bilaterally Card: RRR, no murmurs, rubs, or gallops Abd: nontender, nondistended Extremities: The patient is tender over the left proximal femur with normal range of motion although movement does make the pain worse.  There is no significant pain with passive range of motion, compartments are soft Vascular: 2+ radial pulses bilaterally, 2+ DP pulses bilaterally Neuro: Equal strength sensation throughout bilateral upper and lower extremities with no dysmetria on finger-to-nose testing. Skin: no rashes Psyc: acting appropriately   (all  labs ordered are listed, but only abnormal results are displayed) Labs Reviewed - No data to display  EKG: None  Radiology: DG Hip Unilat W or Wo Pelvis 2-3 Views Left Result Date: 07/24/2024 EXAM: XR Left Hip, 2 or 3 View. CLINICAL HISTORY: left hip pain COMPARISON: None provided. FINDINGS: BONES: No acute fracture or focal osseous lesion. JOINTS: No dislocation. Mild narrowing and osteophyte formation of the left hip is noted. SOFT TISSUES: The soft tissues are unremarkable. IMPRESSION: 1. Mild osteoarthrosis of the left hip. Electronically signed by: Lynwood Seip MD 07/24/2024 01:02 PM EST RP Workstation: [id number]     Procedures   Medications Ordered in the ED  meclizine (ANTIVERT) tablet 25 mg (25 mg Oral Given 07/24/24 1201)  ondansetron  (ZOFRAN -ODT) disintegrating tablet 4 mg (4 mg Oral Given 07/24/24 1201)  oxyCODONE -acetaminophen  (PERCOCET/ROXICET) 5-325 MG per tablet 1 tablet (1 tablet Oral Given 07/24/24 1201)  ketorolac  (TORADOL ) 30 MG/ML injection 30 mg (30 mg Intramuscular Given 07/24/24 1201)                                    Medical Decision Making 48 year old female with past medical history of diabetes and hyperlipidemia presenting to the emergency department today with left hip pain.  I will further evaluate the patient here with an x-ray of her left hip.  She does not have any findings on exam or history consistent with septic arthritis.  The patient has a reassuring neurologic exam here today and is ambulatory without difficulty so suspicion for central cause for the dizziness is low at this time.  She reports that her symptoms have resolved with meclizine.  Will write her prescription for meclizine for likely vertigo.  The patient feeling better on reassessment.  She is discharged with return precautions.  X-ray does show findings consistent with arthritis which I suspect is causing her pain.  Risk Prescription drug management.        Final diagnoses:   Allergic arthritis of left hip  Dizziness    ED Discharge Orders          Ordered    meclizine (ANTIVERT) 25 MG tablet  3 times daily PRN        07/24/24 1714    meloxicam (MOBIC) 7.5 MG tablet  Daily        07/24/24 1714               Ula Prentice SAUNDERS, MD 07/24/24 1715    Ula Prentice SAUNDERS, MD 07/24/24 [phone]

## 2024-07-25 ENCOUNTER — Other Ambulatory Visit: Payer: Self-pay

## 2024-07-29 ENCOUNTER — Other Ambulatory Visit: Payer: Self-pay

## 2024-07-29 ENCOUNTER — Encounter: Payer: Self-pay | Admitting: Pharmacist

## 2024-07-29 ENCOUNTER — Ambulatory Visit: Payer: Self-pay | Attending: Internal Medicine | Admitting: Pharmacist

## 2024-07-29 DIAGNOSIS — Z7984 Long term (current) use of oral hypoglycemic drugs: Secondary | ICD-10-CM

## 2024-07-29 DIAGNOSIS — Z794 Long term (current) use of insulin: Secondary | ICD-10-CM

## 2024-07-29 DIAGNOSIS — E669 Obesity, unspecified: Secondary | ICD-10-CM

## 2024-07-29 DIAGNOSIS — E119 Type 2 diabetes mellitus without complications: Secondary | ICD-10-CM

## 2024-07-29 MED ORDER — BASAGLAR KWIKPEN 100 UNIT/ML ~~LOC~~ SOPN
24.0000 [IU] | PEN_INJECTOR | Freq: Every day | SUBCUTANEOUS | 2 refills | Status: DC
  Filled 2024-07-29 – 2024-08-19 (×3): qty 9, 37d supply, fill #0

## 2024-07-29 MED ORDER — INSULIN LISPRO (1 UNIT DIAL) 100 UNIT/ML (KWIKPEN)
4.0000 [IU] | PEN_INJECTOR | Freq: Two times a day (BID) | SUBCUTANEOUS | 2 refills | Status: DC
  Filled 2024-07-29: qty 3, 28d supply, fill #0
  Filled 2024-08-13 – 2024-08-19 (×2): qty 3, 28d supply, fill #1

## 2024-07-29 NOTE — Progress Notes (Signed)
 Morgan Ramsey, [place] [id number]  No chief complaint on file.  48 y.o. female who presents for diabetes evaluation, education, and management. Patient arrives in good spirits and presents without any assistance.   Patient was referred and last seen by Primary Care Provider, Dr. Vicci, on 06/30/2024. At that visit, A1c was 10.9 (up from 10.7 in July of this year).   PMH is significant for T2DM, obesity, hyperlipidemia. Patient reports diabetes was diagnosed firstly as gestational diabetes ~8-9 years ago. Was treated originally with metformin . Tells me she took this by itself but stopped d/t GI intolerance. It was shortly after this that she was started on insulin . Stopped insulin  with delivery. Controlled with lifestyle. She was then restarted on PO therapy but cannot recall the name ~1 year after delivery. Most recent, she has been on insulin  consistently for a couple of years. Of note, she was tried on Trulicity  but patient self-discontinued this due to worry over side effects. Tells me today she took ~3 injections. She did not experience any NV, or abdominal pain when she took it.  She is still worried about taking it and would prefer to utilize insulin  instead of GLP-1 RA therapy.   Family/Social History:  Fhx: DM, HTN Tobacco: never smoker  Alcohol: none reported   Current diabetes medications include: glimepiride  2 mg, Basaglar  18 units daily  Current hypertension medications include: none Current hyperlipidemia medications include: atorvastatin  10 mg daily  Patient reports adherence to taking all medications as prescribed.   Insurance coverage: self pay   Patient denies hypoglycemic events.  She tells me she is supposed to check twice daily but forgot her written log at home. Reported home fasting blood sugars: reports ~200s-240s. Gives reading of 199 mg/dL. No reading lower than 160 mg/dL per patient.   Reported 2 hour post-meal/random blood sugars: gives 280s - 300s.  Patient  denies polyuria.  Patient reports neuropathy (nerve pain). Patient reports visual changes. Patient reports self foot exams.   Patient reported dietary habits: Eats 2 meals/day -admits that she struggles with this  -tries to limit to chicken, fish, and vegetables  -admits to eating a lot of tortillas   Patient-reported exercise habits:  ~20 minutes  about 3 times weekly   O:  No CGM or GM present at today's visit.   Lab Results  Component Value Date   HGBA1C 10.9 (A) 06/30/2024   There were no vitals filed for this visit.  Lipid Panel     Component Value Date/Time   CHOL 204 (H) 06/30/2024 1051   TRIG 109 06/30/2024 1051   HDL 45 06/30/2024 1051   CHOLHDL 4.5 (H) 06/30/2024 1051   LDLCALC 139 (H) 06/30/2024 1051    Clinical Atherosclerotic Cardiovascular Disease (ASCVD): No  The 10-year ASCVD risk score (Arnett DK, et al., 2019) is: 2.2%   Values used to calculate the score:     Age: 17 years     Clinically relevant sex: Female     Is Non-Hispanic African American: No     Diabetic: Yes     Tobacco smoker: No     Systolic Blood Pressure: 111 mmHg     Is BP treated: No     HDL Cholesterol: 45 mg/dL     Total Cholesterol: 204 mg/dL   Patient is participating in a Managed Medicaid Plan: No   A/P: Diabetes longstanding currently uncontrolled. Patient is not currently hypoglycemic but is able to verbalize appropriate hypoglycemia management plan. She does  experience some neuropathy and changes in vision. Medication adherence appears to be okay. She does not think she is deriving benefit from glimepiride . I think given her sugars at this point, it makes the most sense to simplify her regimen by discontinuing glimepiride  and starting Humalog . She is amenable to this. I will also increase her basal insulin  dose. She does not want to use incretin mimetic therapy at this time.   -Increased dose of Basaglar  to 24 units daily.  -Start Humalog  4 units BID BEFORE meals.   -Discontinued glimepiride .  -Patient educated on purpose, proper use, and potential adverse effects of Humalog .  -Extensively discussed pathophysiology of diabetes, recommended lifestyle interventions, dietary effects on blood sugar control.  -Counseled on s/sx of and management of hypoglycemia.  -Next A1c anticipated 09/2024.   Written patient instructions provided. Patient verbalized understanding of treatment plan.  Total time in face to face counseling 30 minutes.    Follow-up:  Pharmacist in 1 month.  Morgan Ramsey, PharmD, JAQUELINE, CPP Clinical Pharmacist [facility] [phone]

## 2024-08-04 ENCOUNTER — Other Ambulatory Visit: Payer: Self-pay

## 2024-08-13 ENCOUNTER — Other Ambulatory Visit: Payer: Self-pay

## 2024-08-15 ENCOUNTER — Other Ambulatory Visit: Payer: Self-pay

## 2024-08-19 ENCOUNTER — Other Ambulatory Visit: Payer: Self-pay

## 2024-08-20 ENCOUNTER — Other Ambulatory Visit: Payer: Self-pay

## 2024-08-26 ENCOUNTER — Ambulatory Visit: Payer: Self-pay | Admitting: Pharmacist

## 2024-09-01 ENCOUNTER — Other Ambulatory Visit: Payer: Self-pay

## 2024-09-05 ENCOUNTER — Other Ambulatory Visit: Payer: Self-pay

## 2024-09-05 ENCOUNTER — Ambulatory Visit: Payer: Self-pay | Attending: Internal Medicine | Admitting: Pharmacist

## 2024-09-05 ENCOUNTER — Encounter: Payer: Self-pay | Admitting: Pharmacist

## 2024-09-05 DIAGNOSIS — E1169 Type 2 diabetes mellitus with other specified complication: Secondary | ICD-10-CM

## 2024-09-05 DIAGNOSIS — E669 Obesity, unspecified: Secondary | ICD-10-CM

## 2024-09-05 DIAGNOSIS — Z794 Long term (current) use of insulin: Secondary | ICD-10-CM

## 2024-09-05 MED ORDER — BASAGLAR KWIKPEN 100 UNIT/ML ~~LOC~~ SOPN
30.0000 [IU] | PEN_INJECTOR | Freq: Every day | SUBCUTANEOUS | 2 refills | Status: AC
  Filled 2024-09-05: qty 12, 33d supply, fill #0

## 2024-09-05 MED ORDER — INSULIN LISPRO (1 UNIT DIAL) 100 UNIT/ML (KWIKPEN)
10.0000 [IU] | PEN_INJECTOR | Freq: Two times a day (BID) | SUBCUTANEOUS | 2 refills | Status: AC
  Filled 2024-09-05: qty 9, 32d supply, fill #0

## 2024-09-05 NOTE — Progress Notes (Signed)
 "   S:    [employer] Interpreter used for this encounter: Jesus, ID#: [id number]  No chief complaint on file.  49 y.o. female who presents for diabetes evaluation, education, and management. Patient arrives in good spirits and presents without any assistance.   Patient was referred and last seen by Primary Care Provider, Dr. Vicci, on 06/30/2024. At that visit, A1c was 10.9 (up from 10.7 in July of 2025). I saw her on 07/29/2024 and stopped glimepiride . We added bolus insulin  to her regimen instead.  PMH is significant for T2DM, obesity, hyperlipidemia.   Today, patient reports that she is doing okay. She has experienced some increase in her sugar readings since our last visit, despite taking insulin  as prescribed. She has no other concerns today.  Family/Social History:  Fhx: DM, HTN Tobacco: never smoker  Alcohol: none reported   Current diabetes medications include: Basaglar  24 units daily, Humalog  4 units BID before meals  Current hypertension medications include: none Current hyperlipidemia medications include: atorvastatin  10 mg daily  Patient reports adherence to taking all medications as prescribed.   Insurance coverage: self pay   Patient denies hypoglycemic events.  Reported home fasting blood sugars: reports 240s-260s (was in the~200s-240s prior to last visit)  Reported 2 hour post-meal/random blood sugars: 300-350s.  Patient denies polyuria.  Patient reports neuropathy (nerve pain). Patient reports visual changes. Patient reports self foot exams.   Patient reported dietary habits: Eats 2 meals/day -admits that she struggles with this  -tries to limit to chicken, fish, and vegetables  -admits to eating a lot of tortillas   Patient-reported exercise habits:  ~20 minutes  about 3 times weekly   O:  No CGM or GM present at today's visit.   Lab Results  Component Value Date   HGBA1C 10.9 (A) 06/30/2024   There were no vitals filed for this visit.  Lipid Panel      Component Value Date/Time   CHOL 204 (H) 06/30/2024 1051   TRIG 109 06/30/2024 1051   HDL 45 06/30/2024 1051   CHOLHDL 4.5 (H) 06/30/2024 1051   LDLCALC 139 (H) 06/30/2024 1051    Clinical Atherosclerotic Cardiovascular Disease (ASCVD): No  The 10-year ASCVD risk score (Arnett DK, et al., 2019) is: 2.2%   Values used to calculate the score:     Age: 61 years     Clinically relevant sex: Female     Is Non-Hispanic African American: No     Diabetic: Yes     Tobacco smoker: No     Systolic Blood Pressure: 111 mmHg     Is BP treated: No     HDL Cholesterol: 45 mg/dL     Total Cholesterol: 204 mg/dL   Patient is participating in a Managed Medicaid Plan: No   A/P: Diabetes longstanding currently uncontrolled. Patient is not currently hypoglycemic but is able to verbalize appropriate hypoglycemia management plan. She does experience some neuropathy and changes in vision. Medication adherence appears to be okay. We will continue to titrate insulin  given her hyperglycemia. I will also increase her basal insulin  dose. She does not want to use incretin mimetic therapy at this time.   -Increased dose of Basaglar  to 30 units daily. If blood sugars are still above goal after 1 week, increase to 36 units daily. -Increase Humalog  to 10 units BID BEFORE meals. If blood sugars are still above goal after 1 week, increase to 14 units BID before meals. -Patient educated on purpose, proper use, and potential adverse effects  of Humalog .  -Extensively discussed pathophysiology of diabetes, recommended lifestyle interventions, dietary effects on blood sugar control.  -Counseled on s/sx of and management of hypoglycemia.  -Next A1c anticipated 09/2024.   Written patient instructions provided. Patient verbalized understanding of treatment plan.  Total time in face to face counseling 30 minutes.    Follow-up:  Pharmacist in 1 month.  [name], PharmD, JAQUELINE, CPP Clinical Pharmacist [facility] [phone]   "

## 2024-09-17 ENCOUNTER — Other Ambulatory Visit: Payer: Self-pay

## 2024-10-10 ENCOUNTER — Ambulatory Visit: Payer: Self-pay | Admitting: Pharmacist

## 2024-10-14 ENCOUNTER — Ambulatory Visit: Payer: Self-pay | Admitting: Physician Assistant

## 2024-11-28 ENCOUNTER — Ambulatory Visit: Payer: Self-pay | Admitting: Internal Medicine
